# Patient Record
Sex: Male | Born: 1960
Health system: Southern US, Community
[De-identification: ages and names within clinical notes are randomized; demographics above are authoritative.]

## PROBLEM LIST (undated history)

## (undated) DIAGNOSIS — M1990 Unspecified osteoarthritis, unspecified site: Secondary | ICD-10-CM

## (undated) DIAGNOSIS — N200 Calculus of kidney: Secondary | ICD-10-CM

## (undated) DIAGNOSIS — I779 Disorder of arteries and arterioles, unspecified: Secondary | ICD-10-CM

## (undated) DIAGNOSIS — Z79899 Other long term (current) drug therapy: Secondary | ICD-10-CM

## (undated) DIAGNOSIS — K760 Fatty (change of) liver, not elsewhere classified: Secondary | ICD-10-CM

## (undated) DIAGNOSIS — R202 Paresthesia of skin: Secondary | ICD-10-CM

## (undated) DIAGNOSIS — I10 Essential (primary) hypertension: Secondary | ICD-10-CM

## (undated) DIAGNOSIS — N329 Bladder disorder, unspecified: Secondary | ICD-10-CM

## (undated) DIAGNOSIS — I5189 Other ill-defined heart diseases: Secondary | ICD-10-CM

## (undated) DIAGNOSIS — S6291XA Unspecified fracture of right wrist and hand, initial encounter for closed fracture: Secondary | ICD-10-CM

## (undated) DIAGNOSIS — I2510 Atherosclerotic heart disease of native coronary artery without angina pectoris: Secondary | ICD-10-CM

## (undated) DIAGNOSIS — I213 ST elevation (STEMI) myocardial infarction of unspecified site: Secondary | ICD-10-CM

## (undated) DIAGNOSIS — R351 Nocturia: Secondary | ICD-10-CM

## (undated) DIAGNOSIS — T8859XA Other complications of anesthesia, initial encounter: Secondary | ICD-10-CM

## (undated) DIAGNOSIS — E785 Hyperlipidemia, unspecified: Secondary | ICD-10-CM

## (undated) DIAGNOSIS — Z9861 Coronary angioplasty status: Principal | ICD-10-CM

## (undated) DIAGNOSIS — I700 Atherosclerosis of aorta: Secondary | ICD-10-CM

## (undated) DIAGNOSIS — E119 Type 2 diabetes mellitus without complications: Secondary | ICD-10-CM

## (undated) DIAGNOSIS — N400 Enlarged prostate without lower urinary tract symptoms: Secondary | ICD-10-CM

## (undated) HISTORY — DX: Nocturia: R35.1

## (undated) HISTORY — PX: KNEE SURGERY: SHX244

## (undated) HISTORY — PX: NM MYOVIEW LTD: HXRAD82

## (undated) HISTORY — PX: TRANSTHORACIC ECHOCARDIOGRAM: SHX275

## (undated) HISTORY — DX: Coronary angioplasty status: Z98.61

## (undated) HISTORY — DX: Paresthesia of skin: R20.2

## (undated) HISTORY — DX: Atherosclerotic heart disease of native coronary artery without angina pectoris: I25.10

## (undated) HISTORY — DX: Unspecified fracture of right wrist and hand, initial encounter for closed fracture: S62.91XA

## (undated) HISTORY — DX: Hyperlipidemia, unspecified: E78.5

## (undated) HISTORY — PX: COLONOSCOPY: SHX174

---
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
## 1981-12-02 DIAGNOSIS — S6291XA Unspecified fracture of right wrist and hand, initial encounter for closed fracture: Secondary | ICD-10-CM

## 1981-12-02 HISTORY — DX: Unspecified fracture of right wrist and hand, initial encounter for closed fracture: S62.91XA

## 1987-01-25 ENCOUNTER — Encounter: Payer: Self-pay | Admitting: Family Medicine

## 2002-10-21 ENCOUNTER — Encounter: Payer: Self-pay | Admitting: Family Medicine

## 2004-08-03 ENCOUNTER — Encounter: Payer: Self-pay | Admitting: Family Medicine

## 2004-08-03 LAB — CONVERTED CEMR LAB
Blood Glucose, Fasting: 110 mg/dL
TSH: 0.68 microintl units/mL
WBC, blood: 7.3 10*3/uL

## 2004-12-10 ENCOUNTER — Ambulatory Visit: Payer: Self-pay | Admitting: Family Medicine

## 2004-12-10 LAB — CONVERTED CEMR LAB: Blood Glucose, Fasting: 108 mg/dL

## 2004-12-12 ENCOUNTER — Ambulatory Visit: Payer: Self-pay | Admitting: Family Medicine

## 2005-03-14 ENCOUNTER — Ambulatory Visit: Payer: Self-pay | Admitting: Family Medicine

## 2005-03-17 DIAGNOSIS — E1149 Type 2 diabetes mellitus with other diabetic neurological complication: Secondary | ICD-10-CM | POA: Insufficient documentation

## 2005-03-19 ENCOUNTER — Ambulatory Visit: Payer: Self-pay | Admitting: Family Medicine

## 2005-11-22 ENCOUNTER — Ambulatory Visit (HOSPITAL_BASED_OUTPATIENT_CLINIC_OR_DEPARTMENT_OTHER): Admission: RE | Admit: 2005-11-22 | Discharge: 2005-11-22 | Payer: Self-pay | Admitting: Orthopedic Surgery

## 2005-11-22 ENCOUNTER — Ambulatory Visit (HOSPITAL_COMMUNITY): Admission: RE | Admit: 2005-11-22 | Discharge: 2005-11-22 | Payer: Self-pay | Admitting: Orthopedic Surgery

## 2008-06-17 ENCOUNTER — Ambulatory Visit: Payer: Self-pay | Admitting: Family Medicine

## 2008-06-17 DIAGNOSIS — L723 Sebaceous cyst: Secondary | ICD-10-CM

## 2008-06-17 DIAGNOSIS — L089 Local infection of the skin and subcutaneous tissue, unspecified: Secondary | ICD-10-CM | POA: Insufficient documentation

## 2008-06-17 DIAGNOSIS — N342 Other urethritis: Secondary | ICD-10-CM | POA: Insufficient documentation

## 2008-06-17 LAB — CONVERTED CEMR LAB
Bacteria, UA: 0
Bilirubin Urine: NEGATIVE
Blood in Urine, dipstick: NEGATIVE
Epithelial cells, urine: 0 /lpf
Glucose, Urine, Semiquant: NEGATIVE
Ketones, urine, test strip: NEGATIVE
Nitrite: NEGATIVE
Protein, U semiquant: NEGATIVE
RBC / HPF: 0
Specific Gravity, Urine: <1.005
Urobilinogen, UA: 0.2
WBC Urine, dipstick: NEGATIVE
WBC, UA: 0 cells/hpf
pH: 6

## 2008-06-24 ENCOUNTER — Encounter (HOSPITAL_BASED_OUTPATIENT_CLINIC_OR_DEPARTMENT_OTHER): Admission: RE | Admit: 2008-06-24 | Discharge: 2008-07-13 | Payer: Self-pay | Admitting: Internal Medicine

## 2008-06-28 ENCOUNTER — Encounter: Payer: Self-pay | Admitting: Family Medicine

## 2008-06-28 DIAGNOSIS — K219 Gastro-esophageal reflux disease without esophagitis: Secondary | ICD-10-CM

## 2008-06-28 DIAGNOSIS — E782 Mixed hyperlipidemia: Secondary | ICD-10-CM | POA: Insufficient documentation

## 2008-07-11 ENCOUNTER — Encounter: Payer: Self-pay | Admitting: Family Medicine

## 2008-07-20 ENCOUNTER — Encounter: Payer: Self-pay | Admitting: Family Medicine

## 2008-12-12 ENCOUNTER — Ambulatory Visit: Payer: Self-pay | Admitting: Family Medicine

## 2008-12-12 LAB — CONVERTED CEMR LAB
ALT: 47 units/L (ref 0–53)
Alkaline Phosphatase: 43 units/L (ref 39–117)
Bilirubin, Direct: 0.1 mg/dL (ref 0.0–0.3)
CO2: 25 meq/L (ref 19–32)
GFR calc Af Amer: 154 mL/min
Glucose, Bld: 114 mg/dL — ABNORMAL HIGH (ref 70–99)
Microalb Creat Ratio: 14.4 mg/g (ref 0.0–30.0)
Potassium: 4.4 meq/L (ref 3.5–5.1)
Sodium: 136 meq/L (ref 135–145)
TSH: 0.63 microintl units/mL (ref 0.35–5.50)
Total Bilirubin: 0.6 mg/dL (ref 0.3–1.2)
Total Protein: 6.9 g/dL (ref 6.0–8.3)

## 2008-12-14 ENCOUNTER — Telehealth: Payer: Self-pay | Admitting: Family Medicine

## 2008-12-14 ENCOUNTER — Ambulatory Visit: Payer: Self-pay | Admitting: Family Medicine

## 2008-12-14 DIAGNOSIS — M722 Plantar fascial fibromatosis: Secondary | ICD-10-CM | POA: Insufficient documentation

## 2009-04-17 ENCOUNTER — Ambulatory Visit: Payer: Self-pay | Admitting: Family Medicine

## 2009-06-09 ENCOUNTER — Ambulatory Visit: Payer: Self-pay | Admitting: Family Medicine

## 2009-12-20 ENCOUNTER — Ambulatory Visit: Payer: Self-pay | Admitting: Family Medicine

## 2009-12-20 LAB — CONVERTED CEMR LAB
Alkaline Phosphatase: 47 units/L (ref 39–117)
BUN: 15 mg/dL (ref 6–23)
Basophils Absolute: 0 10*3/uL (ref 0.0–0.1)
Basophils Relative: 0.5 % (ref 0.0–3.0)
Bilirubin, Direct: 0 mg/dL (ref 0.0–0.3)
CO2: 28 meq/L (ref 19–32)
Calcium: 9.5 mg/dL (ref 8.4–10.5)
Cholesterol: 251 mg/dL — ABNORMAL HIGH (ref 0–200)
Creatinine, Ser: 0.8 mg/dL (ref 0.4–1.5)
Creatinine,U: 124.3 mg/dL
Direct LDL: 141.1 mg/dL
Eosinophils Absolute: 0.2 10*3/uL (ref 0.0–0.7)
Lymphocytes Relative: 33.6 % (ref 12.0–46.0)
MCHC: 32.8 g/dL (ref 30.0–36.0)
Microalb Creat Ratio: 14.5 mg/g (ref 0.0–30.0)
Microalb, Ur: 1.8 mg/dL (ref 0.0–1.9)
Monocytes Absolute: 0.6 10*3/uL (ref 0.1–1.0)
Neutrophils Relative %: 55.2 % (ref 43.0–77.0)
Platelets: 226 10*3/uL (ref 150.0–400.0)
RBC: 4.78 M/uL (ref 4.22–5.81)
RDW: 12.4 % (ref 11.5–14.6)
Total Bilirubin: 1 mg/dL (ref 0.3–1.2)
Total CHOL/HDL Ratio: 6
Triglycerides: 476 mg/dL — ABNORMAL HIGH (ref 0.0–149.0)

## 2009-12-27 ENCOUNTER — Encounter (INDEPENDENT_AMBULATORY_CARE_PROVIDER_SITE_OTHER): Payer: Self-pay | Admitting: *Deleted

## 2009-12-27 ENCOUNTER — Ambulatory Visit: Payer: Self-pay | Admitting: Family Medicine

## 2010-01-29 ENCOUNTER — Encounter (INDEPENDENT_AMBULATORY_CARE_PROVIDER_SITE_OTHER): Payer: Self-pay | Admitting: *Deleted

## 2010-01-30 ENCOUNTER — Ambulatory Visit: Payer: Self-pay | Admitting: Gastroenterology

## 2010-02-23 ENCOUNTER — Ambulatory Visit: Payer: Self-pay | Admitting: Gastroenterology

## 2010-02-23 LAB — HM COLONOSCOPY

## 2010-02-27 ENCOUNTER — Encounter: Payer: Self-pay | Admitting: Gastroenterology

## 2010-03-09 ENCOUNTER — Telehealth: Payer: Self-pay | Admitting: Family Medicine

## 2010-07-04 ENCOUNTER — Encounter (INDEPENDENT_AMBULATORY_CARE_PROVIDER_SITE_OTHER): Payer: Self-pay | Admitting: *Deleted

## 2010-11-20 ENCOUNTER — Encounter: Payer: Self-pay | Admitting: Family Medicine

## 2010-12-11 ENCOUNTER — Ambulatory Visit
Admission: RE | Admit: 2010-12-11 | Discharge: 2010-12-11 | Payer: Self-pay | Source: Home / Self Care | Attending: Family Medicine | Admitting: Family Medicine

## 2010-12-11 ENCOUNTER — Other Ambulatory Visit: Payer: Self-pay | Admitting: Family Medicine

## 2010-12-11 LAB — LIPID PANEL
Cholesterol: 278 mg/dL — ABNORMAL HIGH (ref 0–200)
HDL: 37.9 mg/dL — ABNORMAL LOW (ref >39.00)
Total CHOL/HDL Ratio: 7
Triglycerides: 863 mg/dL — ABNORMAL HIGH (ref 0.0–149.0)
VLDL: 172.6 mg/dL — ABNORMAL HIGH (ref 0.0–40.0)

## 2010-12-11 LAB — CBC WITH DIFFERENTIAL/PLATELET
Basophils Absolute: 0 10*3/uL (ref 0.0–0.1)
Basophils Relative: 0.3 % (ref 0.0–3.0)
Eosinophils Absolute: 0.2 10*3/uL (ref 0.0–0.7)
Eosinophils Relative: 3.2 % (ref 0.0–5.0)
HCT: 39.8 % (ref 39.0–52.0)
Hemoglobin: 14 g/dL (ref 13.0–17.0)
Lymphocytes Relative: 41.2 % (ref 12.0–46.0)
Lymphs Abs: 2.9 10*3/uL (ref 0.7–4.0)
MCHC: 35.2 g/dL (ref 30.0–36.0)
MCV: 86.9 fl (ref 78.0–100.0)
Monocytes Absolute: 0.6 10*3/uL (ref 0.1–1.0)
Monocytes Relative: 8 % (ref 3.0–12.0)
Neutro Abs: 3.3 10*3/uL (ref 1.4–7.7)
Neutrophils Relative %: 47.3 % (ref 43.0–77.0)
Platelets: 217 10*3/uL (ref 150.0–400.0)
RBC: 4.58 Mil/uL (ref 4.22–5.81)
RDW: 13 % (ref 11.5–14.6)
WBC: 7.1 10*3/uL (ref 4.5–10.5)

## 2010-12-11 LAB — HEPATIC FUNCTION PANEL
ALT: 27 U/L (ref 0–53)
AST: 24 U/L (ref 0–37)
Albumin: 4.1 g/dL (ref 3.5–5.2)
Alkaline Phosphatase: 49 U/L (ref 39–117)
Bilirubin, Direct: 0.1 mg/dL (ref 0.0–0.3)
Total Bilirubin: 0.6 mg/dL (ref 0.3–1.2)
Total Protein: 6.8 g/dL (ref 6.0–8.3)

## 2010-12-11 LAB — BASIC METABOLIC PANEL
BUN: 17 mg/dL (ref 6–23)
CO2: 28 mEq/L (ref 19–32)
Calcium: 9 mg/dL (ref 8.4–10.5)
Chloride: 104 mEq/L (ref 96–112)
Creatinine, Ser: 0.7 mg/dL (ref 0.4–1.5)
GFR: 128.44 mL/min (ref >60.00)
Glucose, Bld: 126 mg/dL — ABNORMAL HIGH (ref 70–99)
Potassium: 4.8 mEq/L (ref 3.5–5.1)
Sodium: 139 mEq/L (ref 135–145)

## 2010-12-11 LAB — TSH: TSH: 0.82 u[IU]/mL (ref 0.35–5.50)

## 2010-12-11 LAB — MICROALBUMIN / CREATININE URINE RATIO
Creatinine,U: 145 mg/dL
Microalb Creat Ratio: 1.1 mg/g (ref 0.0–30.0)
Microalb, Ur: 1.6 mg/dL (ref 0.0–1.9)

## 2010-12-11 LAB — HEMOGLOBIN A1C: Hgb A1c MFr Bld: 7.2 % — ABNORMAL HIGH (ref 4.6–6.5)

## 2010-12-11 LAB — LDL CHOLESTEROL, DIRECT: Direct LDL: 112.5 mg/dL

## 2010-12-11 LAB — PSA: PSA: 0.52 ng/mL (ref 0.10–4.00)

## 2010-12-13 ENCOUNTER — Ambulatory Visit
Admission: RE | Admit: 2010-12-13 | Discharge: 2010-12-13 | Payer: Self-pay | Source: Home / Self Care | Attending: Family Medicine | Admitting: Family Medicine

## 2010-12-13 ENCOUNTER — Telehealth: Payer: Self-pay | Admitting: Family Medicine

## 2010-12-13 LAB — HM DIABETES FOOT EXAM

## 2010-12-19 ENCOUNTER — Encounter: Payer: Self-pay | Admitting: Family Medicine

## 2010-12-26 ENCOUNTER — Encounter: Payer: Self-pay | Admitting: Family Medicine

## 2010-12-26 LAB — HM DIABETES EYE EXAM: HM Diabetic Eye Exam: NORMAL

## 2011-01-01 ENCOUNTER — Telehealth: Payer: Self-pay | Admitting: Family Medicine

## 2011-01-02 NOTE — Letter (Signed)
Summary: Previsit letter  [facility]  [address] [place], [place] [address]   Phone: [phone]  Fax: [phone]       12/27/2009 MRN: [id number]  [name] [name] [address] [place], [place]  [address]  Dear [name],  Welcome to the Gastroenterology Division at [facility].    You are scheduled to see a nurse for your pre-procedure visit on 01/30/2010 at 4:30pm on the 3rd floor at [facility], [address]  We ask that you try to arrive at our office 15 minutes prior to your appointment time to allow for check-in.  Your nurse visit will consist of discussing your medical and surgical history, your immediate family medical history, and your medications.    Please bring a complete list of all your medications or, if you prefer, bring the medication bottles and we will list them.  We will need to be aware of both prescribed and over the counter drugs.  We will need to know exact dosage information as well.  If you are on blood thinners (Coumadin, Plavix, Aggrenox, Ticlid, etc.) please call our office today/prior to your appointment, as we need to consult with your physician about holding your medication.   Please be prepared to read and sign documents such as consent forms, a financial agreement, and acknowledgement forms.  If necessary, and with your consent, a friend or relative is welcome to sit-in on the nurse visit with you.  Please bring your insurance card so that we may make a copy of it.  If your insurance requires a referral to see a specialist, please bring your referral form from your primary care physician.  No co-pay is required for this nurse visit.     If you cannot keep your appointment, please call [phone] to cancel or reschedule prior to your appointment date.  This allows [place] the opportunity to schedule an appointment for another patient in need of care.    Thank you for choosing [facility] for your medical needs.  We  appreciate the opportunity to care for you.  Please visit [place] at our website  to learn more about our practice.                     Sincerely.                                                                                                                   The Gastroenterology Division

## 2011-01-02 NOTE — Progress Notes (Signed)
Summary: Nose Bleed  Phone Note Call from Patient Call back at [phone]   Caller: Patient Call For: Shaune Leeks MD Summary of Call: Patient has been having a nose bleed about once daily since last Saturday.  He is not having any other symptoms just the nose bleed but wants to know if he should be evaluated soon.  No cold, no sinus problems.  He says it took him a while to get it stopped once it started bleeding, the last time it took him about an hour to get it stopped.  Please advise.  Uses [facility]. Initial call taken by: Linde Gillis CMA [facility]),  March 09, 2010 9:04 AM  Follow-up for Phone Call        does not appear to be on coumadin.  These are usually benign and self resolving, but sometimes we need to burn the vessel in the nose that has ruptured.  An hour is quite a long time.  If he is feeling light headed at all, I would have him to go our weekend clinic tomorrow to be evaluated. Follow-up by: Ruthe Mannan MD,  March 09, 2010 9:41 AM  Additional Follow-up for Phone Call Additional follow up Details #1::        Patient advised as instructed.  He says that he does not have any light headed feelings now or even in the past when his nose bleeds.  Will just see how he does over the weekend and go from there.  Advised that if his nose started to bleed more, uncontrollably, or his symptoms change or worsen go to ER over the weekend.  Advised him to not blow his nose if he could help it but only wipe it, blowing hard can trigger the nose bleeds as well. Additional Follow-up by: Linde Gillis CMA [facility]),  March 09, 2010 10:18 AM

## 2011-01-02 NOTE — Letter (Signed)
Summary: Nadara Eaton letter  [place] [facility]  [address] [place], [place] [address]   Phone: [phone]  Fax: [phone]       07/04/2010 MRN: [id number]  SABIR CHARTERS [address] [place], [place]  [address]  Dear Mr. [facility], and  announce the retirement of Arta Silence, M.D., from full-time practice at the [facility] office effective May 31, 2010 and his plans of returning part-time.  It is important to Dr. Hetty Ely and to our practice that you understand that [facility] has seven physicians in our office for your health care needs.  We will continue to offer the same exceptional care that you have today.    Dr. Hetty Ely has spoken to many of you about his plans for retirement and returning part-time in the fall.   We will continue to work with you through the transition to schedule appointments for you in the office and meet the high standards that [place] is committed to.   Again, it is with great pleasure that we share the news that Dr. Hetty Ely will return to [facility] in October of 2011 with a reduced schedule.    If you have any questions, or would like to request an appointment with one of our physicians, please call us at [phone] and press the option for Scheduling an appointment.  We take pleasure in providing you with excellent patient care and look forward to seeing you at your next office visit.  Our [facility] Physicians are:  Tillman Abide, M.D. Laurita Quint, M.D. Roxy Manns, M.D. Kerby Nora, M.D. Hannah Beat, M.D. Ruthe Mannan, M.D. We proudly welcomed Raechel Ache, M.D. and Eustaquio Boyden, M.D. to the practice in July/August 2011.  Sincerely,  [facility]

## 2011-01-02 NOTE — Letter (Signed)
Summary: Patient Notice-Hyperplastic Polyps  [facility]  [address] [place], [place] [address]   Phone: [phone]  Fax: [phone]        February 27, 2010 MRN: [id number]    BRAILON DON [address] [place], [place]  [address]    Dear Mr. Langlois,  I am pleased to inform you that the colon polyp(s) removed during your recent colonoscopy was (were) found to be hyperplastic.  These types of polyps are NOT pre-cancerous.  It is therefore my recommendation that you have a repeat colonoscopy examination in 10_ years for routine colorectal cancer screening.  Should you develop new or worsening symptoms of abdominal pain, bowel habit changes or bleeding from the rectum or bowels, please schedule an evaluation with either your primary care physician or with me.  Additional information/recommendations:  __No further action with gastroenterology is needed at this time.      Please follow-up with your primary care physician for your other      healthcare needs. __Please call [phone] to schedule a return visit to review      your situation.  __Please keep your follow-up visit as already scheduled.  _x_Continue treatment plan as outlined the day of your exam.  Please call us if you are having persistent problems or have questions about your condition that have not been fully answered at this time.  Sincerely,  Louis Meckel MD This letter has been electronically signed by your physician.  Appended Document: Patient Notice-Hyperplastic Polyps letter mailed 3.30.11

## 2011-01-02 NOTE — Letter (Signed)
Summary: [facility] Instructions  [facility]  [address] [place], [place] [address]   Phone: [phone]  Fax: [phone]       [name] [name]    1961/12/01    MRN: [id number]        Procedure Day /Date:  Friday 02/23/2010     Arrival Time: 10:30 am      Procedure Time: 11:30 am     Location of Procedure:                    _x _  [facility] (4th Floor)   PREPARATION FOR COLONOSCOPY WITH MOVIPREP   Starting 5 days prior to your procedure Sunday 3/20 do not eat nuts, seeds, popcorn, corn, beans, peas,  salads, or any raw vegetables.  Do not take any fiber supplements (e.g. Metamucil, Citrucel, and Benefiber).  THE DAY BEFORE YOUR PROCEDURE         DATE: Thursday 3/24  1.  Drink clear liquids the entire day-NO SOLID FOOD  2.  Do not drink anything colored red or purple.  Avoid juices with pulp.  No orange juice.  3.  Drink at least 64 oz. (8 glasses) of fluid/clear liquids during the day to prevent dehydration and help the prep work efficiently.  CLEAR LIQUIDS INCLUDE: Water Jello Ice Popsicles Tea (sugar ok, no milk/cream) Powdered fruit flavored drinks Coffee (sugar ok, no milk/cream) Gatorade Juice: apple, white grape, white cranberry  Lemonade Clear bullion, consomm, broth Carbonated beverages (any kind) Strained chicken noodle soup Hard Candy                             4.  In the morning, mix first dose of MoviPrep solution:    Empty 1 Pouch A and 1 Pouch B into the disposable container    Add lukewarm drinking water to the top line of the container. Mix to dissolve    Refrigerate (mixed solution should be used within 24 hrs)  5.  Begin drinking the prep at 5:00 p.m. The MoviPrep container is divided by 4 marks.   Every 15 minutes drink the solution down to the next mark (approximately 8 oz) until the full liter is complete.   6.  Follow completed prep with 16 oz of clear liquid of your choice (Nothing red or purple).   Continue to drink clear liquids until bedtime.  7.  Before going to bed, mix second dose of MoviPrep solution:    Empty 1 Pouch A and 1 Pouch B into the disposable container    Add lukewarm drinking water to the top line of the container. Mix to dissolve    Refrigerate  THE DAY OF YOUR PROCEDURE      DATE: Friday 3/25  Beginning at 6:30 a.m. (5 hours before procedure):         1. Every 15 minutes, drink the solution down to the next mark (approx 8 oz) until the full liter is complete.  2. Follow completed prep with 16 oz. of clear liquid of your choice.    3. You may drink clear liquids until 9:30 am  (2 HOURS BEFORE PROCEDURE).   MEDICATION INSTRUCTIONS  Unless otherwise instructed, you should take regular prescription medications with a small sip of water   as early as possible the morning of your procedure.         OTHER INSTRUCTIONS  You will need a responsible adult at  least 52 years of age to accompany you and drive you home.   This person must remain in the waiting room during your procedure.  Wear loose fitting clothing that is easily removed.  Leave jewelry and other valuables at home.  However, you may wish to bring a book to read or  an iPod/MP3 player to listen to music as you wait for your procedure to start.  Remove all body piercing jewelry and leave at home.  Total time from sign-in until discharge is approximately 2-3 hours.  You should go home directly after your procedure and rest.  You can resume normal activities the  day after your procedure.  The day of your procedure you should not:   Drive   Make legal decisions   Operate machinery   Drink alcohol   Return to work  You will receive specific instructions about eating, activities and medications before you leave.    The above instructions have been reviewed and explained to me by   Ezra Sites RN  January 30, 2010 4:30 PM    I fully understand and can verbalize these  instructions _____________________________ Date _________

## 2011-01-02 NOTE — Assessment & Plan Note (Signed)
Summary: cpx/bir   Vital Signs:  Patient profile:   50 year old male Weight:      214 pounds Temp:     98.6 degrees F oral Pulse rate:   84 / minute Pulse rhythm:   regular BP sitting:   120 / 74  (left arm) Cuff size:   large  Vitals Entered By: Sydell Axon LPN (December 27, 2009 2:58 PM) CC: 30 Minute checkup, wants to schedule a colonoscopy   History of Present Illness: Pt here for Comp Exam, has no complaints and feels well. He has just turned 48 and desires colonoscopy. His foot still bothers him some.  Preventive Screening-Counseling & Management  Alcohol-Tobacco     Alcohol drinks/day: <1     Alcohol type: beer     Smoking Status: quit < 6 months     Smoking Cessation Counseling: yes     Year Started: 1985     Year Quit: 2011     Cans of tobacco/week: no     Passive Smoke Exposure: no  Caffeine-Diet-Exercise     Caffeine use/day: 1     Does Patient Exercise: yes     Type of exercise: goes to [employer]     Times/week: 1  Problems Prior to Update: 1)  Sinusitis - Acute-nos  (ICD-461.9) 2)  Allergic Rhinitis  (ICD-477.9) 3)  Dizziness  (ICD-780.4) 4)  Plantar Fasciitis, Left  (ICD-728.71) 5)  Health Maintenance Exam  (ICD-V70.0) 6)  Hyperglycemia  (ICD-790.29) 7)  Gerd  (ICD-530.81) 8)  Hyperlipidemia, Mixed  (ICD-272.2) 9)  Other Urethritis  (ICD-597.89) 10)  Sebaceous Cyst  (ICD-706.2)  Medications Prior to Update: 1)  Allegra 180 Mg Tabs (Fexofenadine Hcl) .Marland Kitchen.. 1 Daily By Mouth As Needed 2)  Bayer Aspirin Ec Low Dose 81 Mg Tbec (Aspirin) .Marland Kitchen.. 1 Daily By Mouth 3)  Amoxicillin 875 Mg Tabs (Amoxicillin) .Marland Kitchen.. 1 By Mouth Two Times A Day  Allergies: No Known Drug Allergies  Past History:  Past Medical History: Last updated: 06/09/2009 Allergic rhinitis  Family History: Last updated: 12/27/2009 Father:A 74 HTN CHOL DM  lost 60 pounds and off all meds Mother: A 25  OVARIAN CANCER BROTHER A 44 Hearing Deficit SISTER A 51 CV: + PGF DECEASED MI HBP:  NEGATIVE JX:[id number] GOUT/ARTHRITIS:  PROSTATE CANCER: NEGATIVE OVARIAN CANCER: POSITIVE MOTHER BREAST/UTERINE CANCER: NEGATIVE COLON CANCER: + FATHER POLYPS DEPRESSION: NEGATIVE ETOH/DRUG ABUSE: NEGATIVE OTHER: NEGATIVE STROKE  Social History: Last updated: 12/14/2008 Marital Status: Married LIVES WITH WIFE Children: 1 SON  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[employer] ; [employer]LECT[employer]  Risk Factors: Alcohol Use: <1 (12/27/2009) Caffeine Use: 1 (12/27/2009) Exercise: yes (12/27/2009)  Risk Factors: Smoking Status: quit < 6 months (12/27/2009) Cans of tobacco/wk: no (12/27/2009) Passive Smoke Exposure: no (12/27/2009)  Past Surgical History: SEPTOPLASTY DUE TO FX NOSE  1970 FX R HAND  1983  Family History: Father:A 74 HTN CHOL DM  lost 60 pounds and off all meds Mother: A 31  OVARIAN CANCER BROTHER A 44 Hearing Deficit SISTER A 51 CV: + PGF DECEASED MI HBP: NEGATIVE AO:ZHYQMVHQ GOUT/ARTHRITIS:  PROSTATE CANCER: NEGATIVE OVARIAN CANCER: POSITIVE MOTHER BREAST/UTERINE CANCER: NEGATIVE COLON CANCER: + FATHER POLYPS DEPRESSION: NEGATIVE ETOH/DRUG ABUSE: NEGATIVE OTHER: NEGATIVE STROKE  Social History: Smoking Status:  quit < 6 months Does Patient Exercise:  yes  Review of Systems General:  Denies chills, fatigue, fever, sweats, weakness, and weight loss. Eyes:  Denies blurring, discharge, eye irritation, and eye pain. ENT:  Complains of decreased hearing; denies earache and ringing  in ears; wears hewaring aids bilat. CV:  Denies chest pain or discomfort, fainting, fatigue, palpitations, and shortness of breath with exertion. Resp:  Denies cough, shortness of breath, sputum productive, and wheezing. GI:  Complains of indigestion; denies abdominal pain, bloody stools, change in bowel habits, constipation, dark tarry stools, diarrhea, loss of appetite, nausea, vomiting, vomiting blood, and yellowish skin color; 0ccas. GU:  Denies discharge, dysuria, nocturia, and urinary  frequency. MS:  Denies joint pain, joint redness, joint swelling, low back pain, muscle aches, cramps, muscle weakness, and stiffness. Derm:  Denies dryness, itching, and rash. Neuro:  Complains of poor balance; denies numbness, tingling, and tremors.  Physical Exam  General:  Well-developed,well-nourished,in no acute distress; alert,appropriate and cooperative throughout examination Head:  Normocephalic and atraumatic without obvious abnormalities. No apparent alopecia or balding. Eyes:  Conjunctiva clear bilaterally.  Ears:  External ear exam shows no significant lesions or deformities.  Otoscopic examination reveals clear canals, tympanic membranes are intact bilaterally without bulging, retraction, inflammation or discharge. Hearing is mildly decreased bilaterally. Hearing aids in place bilat.Hearing aids in place bilat. Nose:  External nasal examination shows no deformity or inflammation. Nasal mucosa are pink and moist without lesions or exudates. Mouth:  Oral mucosa and oropharynx without lesions or exudates.  Teeth in good repair. Neck:  No deformities, masses, or tenderness noted. Carotids quiet. Chest Wall:  No deformities, masses, tenderness or gynecomastia noted. Breasts:  No masses or gynecomastia noted Lungs:  Normal respiratory effort, chest expands symmetrically. Lungs are clear to auscultation, no crackles or wheezes. Heart:  Normal rate and regular rhythm. S1 and S2 normal without gallop, murmur, click, rub or other extra sounds. Abdomen:  Bowel sounds positive,abdomen soft and non-tender without masses, organomegaly or hernias noted. Mildly protuberanty. Rectal:  No external abnormalities noted. Normal sphincter tone. No rectal masses or tenderness. G neg. Mild ext hemms deflated. Genitalia:  Testes bilaterally descended without nodularity, tenderness or masses. No scrotal masses or lesions. No penis lesions or urethral discharge. Prostate:  Prostate gland firm and smooth, no  enlargement, nodularity, tenderness, mass, asymmetry or induration. 10 gms. Msk:  No deformity or scoliosis noted of thoracic or lumbar spine.   Pulses:  R and L carotid,radial,femoral,dorsalis pedis and posterior tibial pulses are full and equal bilaterally Extremities:  No clubbing, cyanosis, edema, or deformity noted with normal full range of motion of all joints.   Neurologic:  No cranial nerve deficits noted. Station and gait are normal. Plantar reflexes are down-going bilaterally. DTRs are symmetrical throughout. Sensory, motor and coordinative functions appear intact. Skin:  Intact without suspicious lesions or rashes Cervical Nodes:  No lymphadenopathy noted Inguinal Nodes:  No significant adenopathy Psych:  Cognition and judgment appear intact. Alert and cooperative with normal attention span and concentration. No apparent delusions, illusions, hallucinations   Impression & Recommendations:  Problem # 1:  HEALTH MAINTENANCE EXAM (ICD-V70.0) Assessment Comment Only  Orders: Gastroenterology Referral (GI)  Problem # 2:  SPECIAL SCREENING MALIGNANT NEOPLASM OF PROSTATE (ICD-V76.44) Assessment: New Will start doing next year.  Problem # 3:  HYPERGLYCEMIA (ICD-790.29) Assessment: Unchanged Still high ...discussed .Pt's father just lost lots of weight and normalized his DM and Ht and Chol. Labs Reviewed: Creat: 0.8 (12/20/2009)     Problem # 4:  GERD (ICD-530.81) Assessment: Unchanged Will improve with wt loss.  Problem # 5:  HYPERLIPIDEMIA, MIXED (ICD-272.2) Assessment: Unchanged Trigs too high, LDL too high HDL slightly low. Again wt loss Labs Reviewed: SGOT: 23 (12/20/2009)   SGPT:  30 (12/20/2009)   HDL:39.40 (12/20/2009), 28.3 (12/12/2008)  LDL:DEL (12/12/2008)  Chol:251 (12/20/2009), 223 (12/12/2008)  Trig:476.0 (12/20/2009), 433 (12/12/2008)  Complete Medication List: 1)  Allegra 180 Mg Tabs (Fexofenadine hcl) .Marland Kitchen.. 1 daily by mouth as needed 2)  Bayer Aspirin Ec Low  Dose 81 Mg Tbec (Aspirin) .Marland Kitchen.. 1 daily by mouth  Patient Instructions: 1)  Refer for colonoscopy.  Current Allergies (reviewed today): No known allergies

## 2011-01-02 NOTE — Miscellaneous (Signed)
Summary: [facility]  Clinical Lists Changes  Medications: Added new medication of MOVIPREP 100 GM  SOLR (PEG-KCL-NACL-NASULF-NA ASC-C) As per prep instructions. - Signed Rx of MOVIPREP 100 GM  SOLR (PEG-KCL-NACL-NASULF-NA ASC-C) As per prep instructions.;  #1 x 0;  Signed;  Entered by: Ezra Sites RN;  Authorized by: Louis Meckel MD;  Method used: Electronically to [facility] [address], [place], [place]  [address], Ph: [phone], Fax: [phone] Observations: Added new observation of NKA: T (01/30/2010 15:59)    Prescriptions: MOVIPREP 100 GM  SOLR (PEG-KCL-NACL-NASULF-NA ASC-C) As per prep instructions.  #1 x 0   Entered by:   Ezra Sites RN   Authorized by:   Louis Meckel MD   Signed by:   Ezra Sites RN on 01/30/2010   Method used:   Electronically to        [facility]* (retail)       [address]       [place], [place]  [address]       Ph: [phone]       Fax: [phone]   RxID:   [id number]

## 2011-01-02 NOTE — Procedures (Signed)
Summary: Colonoscopy  Patient: [name] [name] Note: All result statuses are Final unless otherwise noted.  Tests: (1) Colonoscopy (COL)   COL Colonoscopy           DONE     [facility]     [address]     [place], [place]  [address]           COLONOSCOPY PROCEDURE REPORT           PATIENT:  [name] [name]  MR#:  [id number]     BIRTHDATE:  1961-03-29, 48 yrs. old  GENDER:  male     ENDOSCOPIST:  Barbette Hair. Arlyce Dice, MD     REF. BY:  Laurita Quint, M.D.     PROCEDURE DATE:  02/23/2010     PROCEDURE:  Colonoscopy with snare polypectomy     ASA CLASS:  Class I     INDICATIONS:  Routine Risk Screening     MEDICATIONS:   Fentanyl 75 mcg IV, Versed 7 mg IV           DESCRIPTION OF PROCEDURE:   After the risks benefits and     alternatives of the procedure were thoroughly explained, informed     consent was obtained.  Digital rectal exam was performed and     revealed no abnormalities.   The LB [id number] endoscope     was introduced through the anus and advanced to the cecum, which     was identified by the ileocecal valve, without limitations.  The     quality of the prep was good, using MoviPrep.  The instrument was     then slowly withdrawn as the colon was fully examined.     <<PROCEDUREIMAGES>>           FINDINGS:  There were multiple polyps identified and removed. in     the rectum and sigmoid colon. Multiple 1-12mm sessile, hyperplastic     appearing polyps from rectum to 25cm from anus. The 3 largest     polyps were removed with cold polypectomy snare and submitted to     pathology (see image20, image21, and image17).  This was otherwise     a normal examination of the colon (see image1, image2, image3,     image4, image5, image7, image8, image10, image11, and image22).     Retroflexed views in the rectum revealed no abnormalities.    The     scope was then withdrawn from the patient and the procedure     completed.           COMPLICATIONS:  None  ENDOSCOPIC IMPRESSION:     1) Polyps, multiple in the rectum and sigmoid colon     2) Otherwise normal examination     RECOMMENDATIONS:     1) Await biopsy results     REPEAT EXAM:   You will receive a letter from Dr. Arlyce Dice in 1-2     weeks, after reviewing the final pathology, with followup     recommendations.           ______________________________     Barbette Hair Arlyce Dice, MD           CC:           n.     eSIGNED:   Barbette Hair. Kaplan at 02/23/2010 12:09 PM           Ebbie Ridge, [id number]  Note: An exclamation mark (!) indicates a result that was not dispersed  into the flowsheet. Document Creation Date: 02/23/2010 12:51 PM _______________________________________________________________________  (1) Order result status: Final Collection or observation date-time: 02/23/2010 12:02 Requested date-time:  Receipt date-time:  Reported date-time:  Referring Physician:   Ordering Physician: Melvia Heaps [phone]) Specimen Source:  Source: Launa Grill Order Number: [id number] Lab site:   Appended Document: Colonoscopy     Procedures Next Due Date:    Colonoscopy: 01/2020  Appended Document: Colonoscopy    Clinical Lists Changes  Observations: Added new observation of PAST SURG HX: SEPTOPLASTY DUE TO FX NOSE  1970 FX R HAND  1983 COLONOSCOPY POLYPS BENIGN (DR KAPLAN) 02/23/2010               REPEAT  2021 (02/27/2010 17:21)       Past Surgical History:    SEPTOPLASTY DUE TO FX NOSE  1970    FX R HAND  1983    COLONOSCOPY POLYPS BENIGN (DR KAPLAN) 02/23/2010               REPEAT  2021

## 2011-01-03 NOTE — Consult Note (Signed)
Summary: Dr.Todd Hyatt,[facility],Note  Dr.Todd Hyatt,[facility],Note   Imported By: Beau Fanny 12/04/2010 16:47:41  _____________________________________________________________________  External Attachment:    Type:   Image     Comment:   External Document  Appended Document: Dr.Todd Hyatt,[facility],Note Pls insure pt gets scheduled for PE. Dr Al Corpus suggests some findings c/w diabetes. Needs fasating BW.  Appended Document: Dr.Todd Hyatt,[facility],Note Left message on voicemail to call back.   Appended Document: Dr.Todd Hyatt,[facility],Note Left message at home number and on cell phone to call back.  Appended Document: Dr.Todd Hyatt,[facility],Note Spoke with pt, appts made for labs and physical.

## 2011-01-03 NOTE — Progress Notes (Signed)
Summary: needs written script for glucometer and supplies  Phone Note Call from Patient Call back at Home Phone [phone]   Caller: Spouse Charisse Klinefelter Summary of Call: Pt needs written script for glucometer, test strips and lancets.  Script needs to read glucometer of his choice.  I will fax written script to [facility] tomorrow. Initial call taken by: Lowella Petties CMA, AAMA,  December 13, 2010 4:50 PM  Follow-up for Phone Call        Done. Follow-up by: Shaune Leeks MD,  December 13, 2010 5:17 PM  Additional Follow-up for Phone Call Additional follow up Details #1::        Script faxed.                Lowella Petties CMA, AAMA  December 14, 2010 10:10 AM

## 2011-01-03 NOTE — Assessment & Plan Note (Signed)
Summary: CPX   Vital Signs:  Patient profile:   50 year old male Height:      70.5 inches Weight:      210.75 pounds BMI:     29.92 Temp:     98.1 degrees F oral Pulse rate:   76 / minute Pulse rhythm:   regular BP sitting:   120 / 80  (left arm) Cuff size:   large  Vitals Entered By: Sydell Axon LPN (December 13, 2010 8:27 AM) CC: 30 Minute checkup, had a colonoscopy 03/11 by Dr. Arlyce Dice   History of Present Illness: Pt here for Comp Exam. He had polyp on colonoscopy, to be redone in 10 years. He had foot exam by Dr Al Corpus for tenderness of the plantar surface.  He has numbness and rawness of the toes He has no complaints today..he has been concerned about sugar...Marland Kitchenhe has been craving and eating a lot of sweets and ice cream lately.  Preventive Screening-Counseling & Management  Alcohol-Tobacco     Alcohol drinks/day: <1     Alcohol type: beer     Smoking Status: recurrent     Smoking Cessation Counseling: yes     Year Started: 52     Year Quit: 2011     Cans of tobacco/week: no     Passive Smoke Exposure: no  Caffeine-Diet-Exercise     Caffeine use/day: 1     Does Patient Exercise: yes     Type of exercise: goes to [employer]     Times/week: 1  Problems Prior to Update: 1)  Special Screening Malignant Neoplasm of Prostate  (ICD-V76.44) 2)  Plantar Fasciitis, Left  (ICD-728.71) 3)  Health Maintenance Exam  (ICD-V70.0) 4)  Hyperglycemia  (ICD-790.29) 5)  Gerd  (ICD-530.81) 6)  Hyperlipidemia, Mixed  (ICD-272.2) 7)  Other Urethritis  (ICD-597.89) 8)  Sebaceous Cyst  (ICD-706.2)  Medications Prior to Update: 1)  Allegra 180 Mg Tabs (Fexofenadine Hcl) .Marland Kitchen.. 1 Daily By Mouth As Needed 2)  Bayer Aspirin Ec Low Dose 81 Mg Tbec (Aspirin) .Marland Kitchen.. 1 Daily By Mouth  Current Medications (verified): 1)  Allegra 180 Mg Tabs (Fexofenadine Hcl) .Marland Kitchen.. 1 Daily By Mouth As Needed 2)  Bayer Aspirin Ec Low Dose 81 Mg Tbec (Aspirin) .Marland Kitchen.. 1 Daily By Mouth 3)  Centrum Silver  Tabs  (Multiple Vitamins-Minerals) .... Take One By Mouth Daily 4)  Ranitidine Hcl 150 Mg Tabs (Ranitidine Hcl) .... Take One By Mouth Daily As Needed 5)  Omega-3 Krill Oil 300 Mg Caps (Krill Oil) .... Take One By Mouth Daily  Allergies: No Known Drug Allergies  Past History:  Past Medical History: Last updated: 06/09/2009 Allergic rhinitis  Past Surgical History: Last updated: 02/27/2010 SEPTOPLASTY DUE TO FX NOSE  1970 FX R HAND  1983 COLONOSCOPY POLYPS BENIGN (DR KAPLAN) 02/23/2010               REPEAT  2021  Family History: Last updated: 12/13/2010 Father:A 75 HTN CHOL DM  lost 60 pounds and off all meds Bladder Ca  Kidney cancer Mother: A 87  OVARIAN CANCER BROTHER A 45 Hearing Deficit SISTER A 52 CV: + PGF DECEASED MI HBP: NEGATIVE [id number] GOUT/ARTHRITIS:  PROSTATE CANCER: NEGATIVE OVARIAN CANCER: POSITIVE MOTHER BREAST/UTERINE CANCER: NEGATIVE COLON CANCER: + FATHER POLYPS DEPRESSION: NEGATIVE ETOH/DRUG ABUSE: NEGATIVE OTHER: NEGATIVE STROKE  Social History: Last updated: 12/13/2010 Marital Status: Married LIVES WITH WIFE Children: 1 SON  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MGR ; [id number]  Risk Factors: Alcohol Use: <1 (12/13/2010)  Caffeine Use: 1 (12/13/2010) Exercise: yes (12/13/2010)  Risk Factors: Smoking Status: recurrent (12/13/2010) Cans of tobacco/wk: no (12/13/2010) Passive Smoke Exposure: no (12/13/2010)  Family History: Father:A 75 HTN CHOL DM  lost 60 pounds and off all meds Bladder Ca  Kidney cancer Mother: A 74  OVARIAN CANCER BROTHER A 45 Hearing Deficit SISTER A 52 CV: + PGF DECEASED MI HBP: NEGATIVE [id number] GOUT/ARTHRITIS:  PROSTATE CANCER: NEGATIVE OVARIAN CANCER: POSITIVE MOTHER BREAST/UTERINE CANCER: NEGATIVE COLON CANCER: + FATHER POLYPS DEPRESSION: NEGATIVE ETOH/DRUG ABUSE: NEGATIVE OTHER: NEGATIVE STROKE  Social History: Marital Status: Married LIVES WITH WIFE Children: 1 SON  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M[employer] ;  [employer] Smoking Status:  recurrent  Review of Systems General:  Complains of fatigue and weakness; denies chills, fever, sweats, and weight loss. Eyes:  Denies blurring, discharge, and eye pain. ENT:  Complains of decreased hearing; denies earache and ringing in ears; has hearing aids.. CV:  Denies chest pain or discomfort, fainting, palpitations, shortness of breath with exertion, swelling of feet, and swelling of hands; occas tightness. Resp:  Denies cough, shortness of breath, and wheezing. GI:  Denies abdominal pain, bloody stools, change in bowel habits, constipation, dark tarry stools, diarrhea, indigestion, loss of appetite, nausea, vomiting, vomiting blood, and yellowish skin color. GU:  Denies discharge, dysuria, nocturia, and urinary frequency. MS:  Denies joint pain, low back pain, muscle aches, cramps, and stiffness. Derm:  Denies dryness, itching, and rash. Neuro:  Complains of poor balance; denies numbness, tingling, and tremors.  Physical Exam  General:  Well-developed,well-nourished,in no acute distress; alert,appropriate and cooperative throughout examination, overweight. Head:  Normocephalic and atraumatic without obvious abnormalities. No apparent alopecia or balding. Sinuses NT. Eyes:  Conjunctiva clear bilaterally. PERRLA, EOMI. Ears:  External ear exam shows no significant lesions or deformities.  Otoscopic examination reveals clear canals, tympanic membranes are intact bilaterally without bulging, retraction, inflammation or discharge. Hearing is mildly decreased bilaterally. Hearing aids in place bilat.Hearing aids in place bilat. Nose:  External nasal examination shows no deformity or inflammation. Nasal mucosa are pink and moist without lesions or exudates. Mouth:  Oral mucosa and oropharynx without lesions or exudates.  Teeth in good repair. Neck:  No deformities, masses, or tenderness noted. Carotids quiet. Chest Wall:  No deformities, masses,  tenderness or gynecomastia noted. Breasts:  No masses or gynecomastia noted Lungs:  Normal respiratory effort, chest expands symmetrically. Lungs are clear to auscultation, no crackles or wheezes. Heart:  Normal rate and regular rhythm. S1 and S2 normal without gallop, murmur, click, rub or other extra sounds. Abdomen:  Bowel sounds positive,abdomen soft and non-tender without masses, organomegaly or hernias noted. Mildly protuberant. Rectal:  No external abnormalities noted. Normal sphincter tone. No rectal masses or tenderness. G neg. Mild ext hemms deflated. Genitalia:  Testes bilaterally descended without nodularity, tenderness or masses. No scrotal masses or lesions. No penis lesions or urethral discharge. Prostate:  Prostate gland firm and smooth, no enlargement, nodularity, tenderness, mass, asymmetry or induration. 10 gms. Msk:  No deformity or scoliosis noted of thoracic or lumbar spine.   Pulses:  R and L carotid,radial,femoral,dorsalis pedis and posterior tibial pulses are full and equal bilaterally Extremities:  No clubbing, cyanosis, edema, or deformity noted with normal full range of motion of all joints.   Neurologic:  No cranial nerve deficits noted. Station and gait are normal. Sensory, motor and coordinative functions appear intact. Skin:  Intact without suspicious lesions or rashes Cervical Nodes:  No lymphadenopathy noted Inguinal Nodes:  No significant  adenopathy Psych:  Cognition and judgment appear intact. Alert and cooperative with normal attention span and concentration. No apparent delusions, illusions, hallucinations  Diabetes Management Exam:    Foot Exam (with socks and/or shoes not present):       Sensory-Monofilament:          Left foot: diminished          Right foot: diminished       Inspection:          Left foot: normal          Right foot: normal   Impression & Recommendations:  Problem # 1:  HEALTH MAINTENANCE EXAM (ICD-V70.0) Assessment Comment  Only  Reviewed preventive care protocols, scheduled due services, and updated immunizations.  Problem # 2:  DIABETES MELLITUS DX'D 2012 (ICD-250.00) Assessment: New  Discussed pathophysiology, risks and trmts to include diet/exercise and meds. Pt to monitor his FBS every AM for one month until seen. Avoid sweets and carbs totally as able until seen  Already has some peripheral neuropathy of the feet. Eye exam soon is important. His updated medication list for this problem includes:    Bayer Aspirin Ec Low Dose 81 Mg Tbec (Aspirin) .Marland Kitchen... 1 daily by mouth  Labs Reviewed: Creat: 0.7 (12/11/2010)    Reviewed HgBA1c results: 7.2 (12/11/2010)  Problem # 3:  PLANTAR FASCIITIS, LEFT (ICD-728.71) Assessment: Unchanged  Seeing Dr Al Corpus.  Problem # 4:  GERD (ICD-530.81) Assessment: Unchanged  Sxs stabel...will be helped by his diet/exercise helping him lose weight. His updated medication list for this problem includes:    Ranitidine Hcl 150 Mg Tabs (Ranitidine hcl) .Marland Kitchen... Take one by mouth daily as needed  Diagnostics Reviewed:  Discussed lifestyle modifications, diet, antacids/medications, and preventive measures. Handout provided.   Problem # 5:  HYPERLIPIDEMIA, MIXED (ICD-272.2) Assessment: Deteriorated Chol high, goal now changed due to the diabetes diagnosis. Will need a statin for risk reduction. Labs Reviewed: SGOT: 24 (12/11/2010)   SGPT: 27 (12/11/2010)   HDL:37.90 (12/11/2010), 39.40 (12/20/2009)  LDL:DEL (12/12/2008)  Chol:278 (12/11/2010), 251 (12/20/2009)  Trig:863.0 (12/11/2010), 476.0 (12/20/2009)  Complete Medication List: 1)  Allegra 180 Mg Tabs (Fexofenadine hcl) .Marland Kitchen.. 1 daily by mouth as needed 2)  Bayer Aspirin Ec Low Dose 81 Mg Tbec (Aspirin) .Marland Kitchen.. 1 daily by mouth 3)  Centrum Silver Tabs (Multiple vitamins-minerals) .... Take one by mouth daily 4)  Ranitidine Hcl 150 Mg Tabs (Ranitidine hcl) .... Take one by mouth daily as needed 5)  Omega-3 Krill Oil 300 Mg Caps  (Krill oil) .... Take one by mouth daily  Patient Instructions: 1)  RTC 1 month, bring diary. 2)  Get eye exam 3)  Quit Smoking. 4)  Start checking sugar first in the AM fasting.   Orders Added: 1)  Est. Patient 40-64 years [99396]    Current Allergies (reviewed today): No known allergies

## 2011-01-09 NOTE — Progress Notes (Signed)
Summary: form for diabetic supplies  Phone Note From Pharmacy   Caller: [facility] Summary of Call: Form for diabetic supplies is on your desk.  I checked with pt and he does want these supplies. Initial call taken by: Lowella Petties CMA, AAMA,  January 01, 2011 4:29 PM  Follow-up for Phone Call        Done. Thanks. Follow-up by: Shaune Leeks MD,  January 02, 2011 7:34 AM  Additional Follow-up for Phone Call Additional follow up Details #1::        Form faxed, placed up front for scanning. Additional Follow-up by: Lowella Petties CMA, AAMA,  January 02, 2011 8:22 AM

## 2011-01-17 ENCOUNTER — Encounter: Payer: Self-pay | Admitting: Family Medicine

## 2011-01-17 ENCOUNTER — Ambulatory Visit (INDEPENDENT_AMBULATORY_CARE_PROVIDER_SITE_OTHER): Payer: BC Managed Care – PPO | Admitting: Family Medicine

## 2011-01-17 DIAGNOSIS — E119 Type 2 diabetes mellitus without complications: Secondary | ICD-10-CM

## 2011-01-17 NOTE — Medication Information (Signed)
Summary: [facility]eter [employer] planner & Supplies Order   Imported By: Kassie Mends 01/09/2011 09:56:07  _____________________________________________________________________  External Attachment:    Type:   Image     Comment:   External Document

## 2011-01-23 NOTE — Assessment & Plan Note (Signed)
Summary: ROA FOR 1 MONTH FU/JRR   Vital Signs:  Patient profile:   50 year old male Weight:      202 pounds Temp:     98.3 degrees F oral Pulse rate:   64 / minute Pulse rhythm:   regular BP sitting:   104 / 64  (left arm) Cuff size:   large  Vitals Entered By: Sydell Axon LPN (January 17, 2011 9:09 AM) CC: One month follow-up on diabetes   History of Present Illness: He quit smoking the day he left here. He is being very careful with intake and is exercising regularly. He was diagnosed as diabetic last time with a Hgb A1C of 7.2 and FBS of 126. He has checked his FBS daily and typically runs 100-120s with one 140+. He feels well and has no complaints. He has lost significant weight since being here.  Problems Prior to Update: 1)  Special Screening Malignant Neoplasm of Prostate  (ICD-V76.44) 2)  Plantar Fasciitis, Left  (ICD-728.71) 3)  Health Maintenance Exam  (ICD-V70.0) 4)  Diabetes Mellitus Dx'd 2012  (ICD-250.00) 5)  Gerd  (ICD-530.81) 6)  Hyperlipidemia, Mixed  (ICD-272.2) 7)  Other Urethritis  (ICD-597.89) 8)  Sebaceous Cyst  (ICD-706.2)  Medications Prior to Update: 1)  Allegra 180 Mg Tabs (Fexofenadine Hcl) .Marland Kitchen.. 1 Daily By Mouth As Needed 2)  Bayer Aspirin Ec Low Dose 81 Mg Tbec (Aspirin) .Marland Kitchen.. 1 Daily By Mouth 3)  Centrum Silver  Tabs (Multiple Vitamins-Minerals) .... Take One By Mouth Daily 4)  Ranitidine Hcl 150 Mg Tabs (Ranitidine Hcl) .... Take One By Mouth Daily As Needed 5)  Omega-3 Krill Oil 300 Mg Caps (Krill Oil) .... Take One By Mouth Daily  Current Medications (verified): 1)  Allegra 180 Mg Tabs (Fexofenadine Hcl) .Marland Kitchen.. 1 Daily By Mouth As Needed 2)  Bayer Aspirin Ec Low Dose 81 Mg Tbec (Aspirin) .Marland Kitchen.. 1 Daily By Mouth 3)  Centrum Silver  Tabs (Multiple Vitamins-Minerals) .... Take One By Mouth Daily 4)  Ranitidine Hcl 150 Mg Tabs (Ranitidine Hcl) .... Take One By Mouth Daily As Needed 5)  Omega-3 Krill Oil 300 Mg Caps (Krill Oil) .... Take One By Mouth  Daily 6)  Gabapentin 100 Mg Caps (Gabapentin) .... Take One By Mouth At Bedtime 7)  Meloxicam 15 Mg Tabs (Meloxicam) .... Take One By Mouth Daily  Allergies: No Known Drug Allergies  Physical Exam  General:  Well-developed,well-nourished,in no acute distress; alert,appropriate and cooperative throughout examination. Head:  Normocephalic and atraumatic without obvious abnormalities. No apparent alopecia or balding. Sinuses NT. Eyes:  Conjunctiva clear bilaterally.  Ears:  External ear exam shows no significant lesions or deformities.  Otoscopic examination reveals clear canals, tympanic membranes are intact bilaterally without bulging, retraction, inflammation or discharge. Hearing is mildly decreased bilaterally. Hearing aids in place bilat.Hearing aids in place bilat. Nose:  External nasal examination shows no deformity or inflammation. Nasal mucosa are pink and moist without lesions or exudates. Mouth:  Oral mucosa and oropharynx without lesions or exudates.  Teeth in good repair. Neck:  No deformities, masses, or tenderness noted. Carotids quiet. Lungs:  Normal respiratory effort, chest expands symmetrically. Lungs are clear to auscultation, no crackles or wheezes. Heart:  Normal rate and regular rhythm. S1 and S2 normal without gallop, murmur, click, rub or other extra sounds. Abdomen:  Bowel sounds positive,abdomen soft and non-tender without masses, organomegaly or hernias noted. Mildly protuberant.  Diabetes Management Exam:    Eye Exam:  Eye Exam done elsewhere          Date: 12/26/2010          Results: normal          Done by: Dr Marlyne Beards   Impression & Recommendations:  Problem # 1:  DIABETES MELLITUS DX'D 2012 (ICD-250.00) Assessment Improved Doing well, has lost weight, is watching his diet and nos are generally good. Cont as is and start 4 day progression of monitoring. His updated medication list for this problem includes:    Bayer Aspirin Ec Low Dose 81 Mg Tbec  (Aspirin) .Marland Kitchen... 1 daily by mouth  Labs Reviewed: Creat: 0.7 (12/11/2010)     Last Eye Exam: normal (12/26/2010) Reviewed HgBA1c results: 7.2 (12/11/2010)  Complete Medication List: 1)  Allegra 180 Mg Tabs (Fexofenadine hcl) .Marland Kitchen.. 1 daily by mouth as needed 2)  Bayer Aspirin Ec Low Dose 81 Mg Tbec (Aspirin) .Marland Kitchen.. 1 daily by mouth 3)  Centrum Silver Tabs (Multiple vitamins-minerals) .... Take one by mouth daily 4)  Ranitidine Hcl 150 Mg Tabs (Ranitidine hcl) .... Take one by mouth daily as needed 5)  Omega-3 Krill Oil 300 Mg Caps (Krill oil) .... Take one by mouth daily 6)  Gabapentin 100 Mg Caps (Gabapentin) .... Take one by mouth at bedtime 7)  Meloxicam 15 Mg Tabs (Meloxicam) .... Take one by mouth daily  Patient Instructions: 1)  RTC 3 mos. A1C prior.250.00 2)  He will need ACEI and Statin in the near future.   Orders Added: 1)  Est. Patient Level III [16109]    Current Allergies (reviewed today): No known allergies

## 2011-01-29 NOTE — Letter (Signed)
Summary: [facility]   [facility]   Imported By: Kassie Mends 01/22/2011 08:06:14  _____________________________________________________________________  External Attachment:    Type:   Image     Comment:   External Document

## 2011-04-09 ENCOUNTER — Encounter: Payer: Self-pay | Admitting: Family Medicine

## 2011-04-09 ENCOUNTER — Other Ambulatory Visit: Payer: Self-pay | Admitting: Family Medicine

## 2011-04-12 ENCOUNTER — Other Ambulatory Visit (INDEPENDENT_AMBULATORY_CARE_PROVIDER_SITE_OTHER): Payer: BC Managed Care – PPO | Admitting: Family Medicine

## 2011-04-12 DIAGNOSIS — E119 Type 2 diabetes mellitus without complications: Secondary | ICD-10-CM

## 2011-04-12 LAB — HEMOGLOBIN A1C: Hgb A1c MFr Bld: 6.6 % — ABNORMAL HIGH (ref 4.6–6.5)

## 2011-04-16 NOTE — Assessment & Plan Note (Signed)
[facility]                                 ON-CALL NOTE   [name] [name], [name] [name]                      MRN:          [id number]  DATE:06/19/2008                            DOB:          12-23-1960    Patient of Dr. Hetty Ely, called from [phone] at 10:40 a.m. on June 19, 2008.   The patient saw Everrett Coombe on Friday for a cyst under his testicle and  he was put on Keflex 500 mg 4 times a day.  He feels like the swelling  is getting worse and he had a lot of pain and burning in the area.  He  is wondering if that is normal.  I explained while it may not be  improving yet because he has really only been on the antibiotics for 1  day; however, it should not be getting worse and he should go to an  urgent care or the emergency room to have them look at it.  The patient  agreed and will go to an urgent care.     Lelon Perla, DO  Electronically Signed    [name]  DD: 06/19/2008  DT: 06/20/2008  Job #: [id number]   cc:   Arta Silence, MD

## 2011-04-16 NOTE — Assessment & Plan Note (Signed)
[facility]   NAME:  [name] [name], [name] [name] NO.:  [id number]   MEDICAL RECORD NO.:  [id number]      DATE OF BIRTH:  05/24/1961   PHYSICIAN:  Maxwell Caul, M.D. VISIT DATE:  06/27/2008                                   OFFICE VISIT   [name] felt a small lump in his right groin 2 weeks ago.  He felt  this was a cyst.  He was seen in his primary care physician's office.  The cyst apparently had a small head on it, and he was prescribed oral  antibiotics.  Over the next 2 days, he developed increasing right  scrotal hemi swelling and increasing pain.  By Sunday, the pain was  extreme and erythema involved the scrotum and part of his penis.  He was  admitted to hospital in [facility].  He underwent an I&D by a  urologist.  Apparently, the culture was eventually negative.  He was  treated with vancomycin and Flagyl, and with further whirlpool and  packing.  He states the area in his scrotum is getting better every day,  and he is here for our review of this.   PHYSICAL EXAMINATION:  Temperature is 98.2, pulse 72, respirations 18,  and blood pressure is 141/77.  There is a small surgical area to the  right scrotum which measures 1.4 x 0.5 x 1.7.  This was a healthy-  looking surgical wound.  There has already been some closure here.  There is healthy granulation tissue.  No substance, no drainage, and no  evidence of infection was appreciated.  Palpation of the scrotum did not  reveal any abnormalities.   IMPRESSION:  Healthy-appearing post surgical wound, right hemi scrotum,  secondary to an abscess.  We continue to pack this with a plain packing.  I think this will probably heal nicely.  I did not appreciate any  underlying scrotal abnormalities.  We did make him an appointment for  followup in the Urology office.  However, in terms of the wound, this  looks like it is healing nicely.  He will be seen in Nurse Clinic next  week.      ______________________________  Maxwell Caul, M.D.     MGR/MEDQ  D:  06/27/2008  T:  06/28/2008  Job:  [id number]

## 2011-04-16 NOTE — Group Therapy Note (Signed)
NAME:  ADAM, SANJUAN NO.:  [id number]   MEDICAL RECORD NO.:  [id number]         PATIENT TYPE:  HREC   LOCATION:                                 FACILITY:   PHYSICIAN:  Lenon Curt. Chilton Si, M.D.  DATE OF BIRTH:  June 20, 1961                                 PROGRESS NOTE   WOUND CARE PROGRESS NOTE:   HISTORY:  A 50 year old male who underwent the drainage of a cyst and subsequently  cellulitis and open wound to the right scrotum, who was last seen here  on June 29, 2006.  He was referred to urology; however, that visit has  not taken place to date.  The patient is recovering well, and the area  of ulceration at the right scrotum appears to be nearly completely  healed at the present time.  There is very little if any drainage.  The  patient is comfortable, nontender and has not been running any fever.  He is voiding normally.   The wound is considered healed fully enough to warrant discharge from  the wound care clinic.  No return appointment is scheduled.  He is  advised to continue on the consultation with the urologist; however, we  do not expect much to change in the course of that visit either.      Lenon Curt Chilton Si, M.D.  Electronically Signed     AGG/MEDQ  D:  07/11/2008  T:  07/11/2008  Job:  [id number]

## 2011-04-17 ENCOUNTER — Ambulatory Visit (INDEPENDENT_AMBULATORY_CARE_PROVIDER_SITE_OTHER): Payer: BC Managed Care – PPO | Admitting: Family Medicine

## 2011-04-17 ENCOUNTER — Encounter: Payer: Self-pay | Admitting: Family Medicine

## 2011-04-17 DIAGNOSIS — E119 Type 2 diabetes mellitus without complications: Secondary | ICD-10-CM

## 2011-04-17 NOTE — Patient Instructions (Signed)
RTC one month forrecheck of chol/Trig,  fasting labs for lipid profile prior

## 2011-04-17 NOTE — Progress Notes (Signed)
  Subjective:    Patient ID: [name] [name], male    DOB: [birth year], 50 y.o.   MRN: [id number]  HPI Pt here for three month followup of DM. He has shown really good control. His 4 day progressive diary is generally 90s to 110s, very rare excursion. He feels well and has no complaints.    Review of SystemsNoncontributory except as above.       Objective:   Physical Exam  Constitutional: He appears well-developed and well-nourished. No distress.  HENT:  Head: Normocephalic and atraumatic.  Right Ear: External ear normal.  Left Ear: External ear normal.  Nose: Nose normal.  Mouth/Throat: Oropharynx is clear and moist.  Eyes: Conjunctivae and EOM are normal. Pupils are equal, round, and reactive to light. Right eye exhibits no discharge. Left eye exhibits no discharge.  Neck: Normal range of motion. Neck supple.  Cardiovascular: Normal rate and regular rhythm.   Pulmonary/Chest: Effort normal and breath sounds normal. He has no wheezes.  Lymphadenopathy:    He has no cervical adenopathy.  Skin: He is not diaphoretic.          Assessment & Plan:

## 2011-04-17 NOTE — Assessment & Plan Note (Addendum)
Nos much better. Think he has now become motivated to avoid complications by good control. Lab Results  Component Value Date   HGBA1C 6.6* 04/12/2011   Discussed need for ACEI and Statin therapy. He would like to hold off as long as  Poss. Will check lipid profile in one month and see him back to discuss what and how much to put him on as a statin.

## 2011-04-19 NOTE — Op Note (Signed)
NAME:  BUREL, KAHRE NO.:  [id number]   MEDICAL RECORD NO.:  [id number]          PATIENT TYPE:  AMB   LOCATION:  DSC                          FACILITY:  [facility]   PHYSICIAN:  Robert A. Thurston Hole, M.D. DATE OF BIRTH:  Jan 08, 1961   DATE OF PROCEDURE:  11/22/2005  DATE OF DISCHARGE:                                 OPERATIVE REPORT   PREOPERATIVE DIAGNOSIS:  Left knee medial and lateral meniscal tears with  chondromalacia.   POSTOPERATIVE DIAGNOSIS:  Left knee medial and lateral meniscal tears with  chondromalacia.   PROCEDURES:  1.  Left knee EOA, followed by arthroscopic partial medial and lateral      meniscectomies.  2.  Left knee chondroplasty.   SURGEON:  Elana Alm. Thurston Hole, M.D.   ASSISTANT:  Julien Girt, P.A.   ANESTHESIA:  General.   OPERATIVE TIME:  30 minutes.   COMPLICATIONS:  None.   INDICATIONS FOR PROCEDURE:  Mr. Marlette is a 50 year old gentleman who has  had 2-3 months of increasing left knee pain; with exam and MRI documenting  medial meniscus tear with chondromalacia. He has failed conservative care  and is now to undergo arthroscopy.   DESCRIPTION OF PROCEDURE:  Mr. Loberg is brought to the operating room on  November 22, 2005; placed on the operative table in the supine position.  After an adequate level of general anesthesia was obtained, his left knee  was examined. He had full range of motion.  The knee was stable ligamentous  exam with normal patellar tracking. Knee was sterilely injected with 0.25%  Marcaine with epinephrine. The left leg was prepped in usual sterile  DuraPrep and draped using a sterile technique. Originally, through an  anterolateral portal, the arthroscope with a pump attached was placed into  an anteromedial portal. An arthroscopic probe was placed. On initial  inspection of the medial compartment, he was found to have 75% grade 3  chondromalacia on the medial femoral condyle, which was debrided.  The  medial tibial plateau showed minimal changes.  The medial meniscus tear at  posterior horn, of which 50% was resected back to a stable rim.  Intercondylar notch inspected; anterior and posterior cruciate ligaments  were normal.  Lateral compartment inspected. He had a 30% grade 3  chondromalacia in the lateral tibial plateau, which was debrided. Minimal  changes in the lateral femoral condyle. Partial tear posterior medial root  of the lateral meniscus; 20% was resected back to a stable rim.  Patellofemoral joint articular cartilage was normal. The patella tracked  normally. Medial and lateral gutters were free of pathology. After this was  done it was felt that all pathology had been satisfactorily addressed. The  instruments were  removed. The portals were closed with 3-0 nylon suture and  injected with 0.25% Marcaine with epinephrine, and 4 mg of morphine. Sterile  dressings were applied and the patient awakened and taken to recovery room  in stable condition.   FOLLOW-UP CARE:  Mr. Bigley will be followed as an outpatient on Vicodin and  Naprosyn. He should come back to the office in  1  week for sutures out and  follow-up.      Robert A. Thurston Hole, M.D.  Electronically Signed     RAW/MEDQ  D:  11/22/2005  T:  11/24/2005  Job:  [id number]

## 2011-06-10 ENCOUNTER — Other Ambulatory Visit: Payer: BC Managed Care – PPO

## 2011-06-12 ENCOUNTER — Ambulatory Visit: Payer: BC Managed Care – PPO | Admitting: Family Medicine

## 2011-06-17 ENCOUNTER — Other Ambulatory Visit (INDEPENDENT_AMBULATORY_CARE_PROVIDER_SITE_OTHER): Payer: BC Managed Care – PPO | Admitting: Family Medicine

## 2011-06-17 DIAGNOSIS — E782 Mixed hyperlipidemia: Secondary | ICD-10-CM

## 2011-06-17 DIAGNOSIS — E119 Type 2 diabetes mellitus without complications: Secondary | ICD-10-CM

## 2011-06-17 LAB — LDL CHOLESTEROL, DIRECT: Direct LDL: 180 mg/dL

## 2011-06-17 LAB — MICROALBUMIN / CREATININE URINE RATIO
Creatinine,U: 152.7 mg/dL
Microalb Creat Ratio: 1.4 mg/g (ref 0.0–30.0)
Microalb, Ur: 2.2 mg/dL — ABNORMAL HIGH (ref 0.0–1.9)

## 2011-06-17 NOTE — Progress Notes (Signed)
Addended by: Baldomero Lamy on: 06/17/2011 08:26 AM   Modules accepted: Orders

## 2011-06-17 NOTE — Progress Notes (Signed)
Addended by: Baldomero Lamy on: 06/17/2011 09:52 AM   Modules accepted: Orders

## 2011-06-19 ENCOUNTER — Encounter: Payer: Self-pay | Admitting: Family Medicine

## 2011-06-19 ENCOUNTER — Ambulatory Visit (INDEPENDENT_AMBULATORY_CARE_PROVIDER_SITE_OTHER): Payer: BC Managed Care – PPO | Admitting: Family Medicine

## 2011-06-19 VITALS — BP 108/68 | HR 60 | Temp 98.7°F | Wt 205.0 lb

## 2011-06-19 DIAGNOSIS — L723 Sebaceous cyst: Secondary | ICD-10-CM

## 2011-06-19 DIAGNOSIS — E782 Mixed hyperlipidemia: Secondary | ICD-10-CM

## 2011-06-19 DIAGNOSIS — E119 Type 2 diabetes mellitus without complications: Secondary | ICD-10-CM

## 2011-06-19 MED ORDER — SIMVASTATIN 40 MG PO TABS: 40.0000 mg | ORAL_TABLET | Freq: Every evening | ORAL | Status: DC

## 2011-06-19 NOTE — Assessment & Plan Note (Signed)
Long discussion about monitoring and diet and exercise and medications. Will hold off on ACEI at this point as he has had some erection difficulties and will try not to add to them at this point. We discussed ED at length as well, going through pathophysiology and risks.  A1C last time 6.6 Will check again next time.

## 2011-06-19 NOTE — Assessment & Plan Note (Addendum)
LDl high, will start Simva 40. Recheck in three months. Trig much better than historically. He has been watching his sweets and carbs carefully. HDL acceptable.

## 2011-06-19 NOTE — Progress Notes (Signed)
  Subjective:    Patient ID: [name] [name], male    DOB: 04/17/1961, 50 y.o.   MRN: [id number]  HPI Pt here for followup. He has diabetes and hyperchol and has gotten motivated with his sugar. His last A1C went from 7.2 to 6.6. He wants to avoid ACEI and Statins as long as possible. We checked his Chol profile this time.  After long discussion about diet and monitoring sugar and exercise and nos,  he said he uses Splenda in his coffee and has and occas beer.     Review of Systems  Constitutional: Negative for fever, chills, diaphoresis, activity change, appetite change and fatigue.  HENT: Negative for hearing loss, ear pain, congestion, sore throat, rhinorrhea, neck pain, neck stiffness, postnasal drip, sinus pressure, tinnitus and ear discharge.   Eyes: Negative for pain, discharge and visual disturbance.  Respiratory: Negative for cough, shortness of breath and wheezing.   Cardiovascular: Negative for chest pain and palpitations.       No SOB w/ exertion  Gastrointestinal:       No heartburn or swallowing problems.  Genitourinary:       No nocturia  Skin:       No itching or dryness.  Neurological:       No tingling or balance problems.  All other systems reviewed and are negative.       Objective:   Physical Exam No exam        Assessment & Plan:

## 2011-06-19 NOTE — Assessment & Plan Note (Signed)
Put on Doxycycline for what appears like one of these on the left posterior neck by Derm, who he saw yesterday.

## 2011-06-19 NOTE — Patient Instructions (Signed)
SGOT, SGPT 272.4    6 WEEKS See me in 3 mos, SGOT, SGPT, CHOL PROFILE  272.4    A1C 250.00 prior

## 2011-09-12 ENCOUNTER — Other Ambulatory Visit: Payer: Self-pay | Admitting: Family Medicine

## 2011-09-12 DIAGNOSIS — N342 Other urethritis: Secondary | ICD-10-CM

## 2011-09-12 DIAGNOSIS — E782 Mixed hyperlipidemia: Secondary | ICD-10-CM

## 2011-09-19 ENCOUNTER — Other Ambulatory Visit (INDEPENDENT_AMBULATORY_CARE_PROVIDER_SITE_OTHER): Payer: BC Managed Care – PPO

## 2011-09-19 DIAGNOSIS — E782 Mixed hyperlipidemia: Secondary | ICD-10-CM

## 2011-09-19 DIAGNOSIS — N342 Other urethritis: Secondary | ICD-10-CM

## 2011-09-19 DIAGNOSIS — E119 Type 2 diabetes mellitus without complications: Secondary | ICD-10-CM

## 2011-09-19 LAB — RENAL FUNCTION PANEL
Albumin: 4.4 g/dL (ref 3.5–5.2)
CO2: 27 mEq/L (ref 19–32)
Chloride: 105 mEq/L (ref 96–112)
GFR: 103.46 mL/min (ref >60.00)
Phosphorus: 3.3 mg/dL (ref 2.3–4.6)
Potassium: 4.6 mEq/L (ref 3.5–5.1)

## 2011-09-19 LAB — CBC WITH DIFFERENTIAL/PLATELET
Basophils Relative: 0.5 % (ref 0.0–3.0)
Hemoglobin: 13.9 g/dL (ref 13.0–17.0)
Lymphocytes Relative: 35.4 % (ref 12.0–46.0)
MCHC: 34.2 g/dL (ref 30.0–36.0)
Monocytes Relative: 7.5 % (ref 3.0–12.0)
Neutro Abs: 3.8 10*3/uL (ref 1.4–7.7)
RBC: 4.63 Mil/uL (ref 4.22–5.81)

## 2011-09-19 LAB — LIPID PANEL
Cholesterol: 220 mg/dL — ABNORMAL HIGH (ref 0–200)
Total CHOL/HDL Ratio: 5

## 2011-09-19 LAB — HEPATIC FUNCTION PANEL
ALT: 35 U/L (ref 0–53)
AST: 27 U/L (ref 0–37)
Albumin: 4.4 g/dL (ref 3.5–5.2)
Alkaline Phosphatase: 45 U/L (ref 39–117)
Total Protein: 7.6 g/dL (ref 6.0–8.3)

## 2011-09-19 LAB — MICROALBUMIN / CREATININE URINE RATIO: Creatinine,U: 134.8 mg/dL

## 2011-09-25 ENCOUNTER — Ambulatory Visit (INDEPENDENT_AMBULATORY_CARE_PROVIDER_SITE_OTHER): Payer: BC Managed Care – PPO | Admitting: Family Medicine

## 2011-09-25 ENCOUNTER — Encounter: Payer: Self-pay | Admitting: Family Medicine

## 2011-09-25 DIAGNOSIS — E782 Mixed hyperlipidemia: Secondary | ICD-10-CM

## 2011-09-25 DIAGNOSIS — E119 Type 2 diabetes mellitus without complications: Secondary | ICD-10-CM

## 2011-09-25 NOTE — Assessment & Plan Note (Signed)
Good response with LDL much lower but still not quite acceptable due to DM. Will give some time as weight loss should help lower this as well. Trigs too high but will probably respond to weight loss as well.  Will have him rechecked at PE in Jan. Lab Results  Component Value Date   CHOL 220* 09/19/2011   CHOL 251* 06/17/2011   CHOL 278* 12/11/2010   Lab Results  Component Value Date   HDL 40.20 09/19/2011   HDL 41.70 06/17/2011   HDL 16.10* 12/11/2010   No results found for this basename: LDLCALC   Lab Results  Component Value Date   TRIG 381.0* 09/19/2011   TRIG 237.0* 06/17/2011   TRIG 863.0* 12/11/2010   Lab Results  Component Value Date   CHOLHDL 5 09/19/2011   CHOLHDL 6 06/17/2011   CHOLHDL 7 12/11/2010   Lab Results  Component Value Date   LDLDIRECT 119.9 09/19/2011   LDLDIRECT 180.0 06/17/2011   LDLDIRECT 112.5 12/11/2010

## 2011-09-25 NOTE — Progress Notes (Signed)
  Subjective:    Patient ID: [name] [name], male    DOB: 1961-02-21, 50 y.o.   MRN: [id number]  HPI Pt here for three month followup. He had been very suuccessful at losing weight but is slowly gaining it back. Has gained 13 pounds in three months. He knows things have been slipping away from him. He knows he can do better and has proven he can lose weight. We started him on Simva last time for Chol. He has tolerated this well with no complaints.    Review of SystemsNoncontributory except as above.       Objective:   Physical Exam  Constitutional: He appears well-developed and well-nourished. No distress.  HENT:  Head: Normocephalic and atraumatic.  Right Ear: External ear normal.  Left Ear: External ear normal.  Nose: Nose normal.  Mouth/Throat: Oropharynx is clear and moist.  Eyes: Conjunctivae and EOM are normal. Pupils are equal, round, and reactive to light. Right eye exhibits no discharge. Left eye exhibits no discharge.  Neck: Normal range of motion. Neck supple.  Cardiovascular: Normal rate and regular rhythm.   Pulmonary/Chest: Effort normal and breath sounds normal. He has no wheezes.  Lymphadenopathy:    He has no cervical adenopathy.  Skin: He is not diaphoretic.          Assessment & Plan:

## 2011-09-25 NOTE — Assessment & Plan Note (Signed)
Lab Results  Component Value Date   HGBA1C 6.8* 09/19/2011  Control has slipped. Discussed at length. He thinks he can get things under control again through diet and exercise. Will give him a few months and have things rechecked for PE in Jan. Kidney liver and thyroid all ok today.

## 2011-09-25 NOTE — Patient Instructions (Addendum)
RTC Jan  for COMP exam, labs prior with Dr Para March.

## 2011-12-12 ENCOUNTER — Other Ambulatory Visit: Payer: Self-pay | Admitting: Family Medicine

## 2011-12-12 DIAGNOSIS — E119 Type 2 diabetes mellitus without complications: Secondary | ICD-10-CM

## 2011-12-17 ENCOUNTER — Other Ambulatory Visit (INDEPENDENT_AMBULATORY_CARE_PROVIDER_SITE_OTHER): Payer: BC Managed Care – PPO

## 2011-12-17 DIAGNOSIS — E119 Type 2 diabetes mellitus without complications: Secondary | ICD-10-CM

## 2011-12-17 LAB — LIPID PANEL
HDL: 39.1 mg/dL (ref >39.00)
Total CHOL/HDL Ratio: 5
Triglycerides: 498 mg/dL — ABNORMAL HIGH (ref 0.0–149.0)

## 2011-12-17 LAB — HEMOGLOBIN A1C: Hgb A1c MFr Bld: 7.2 % — ABNORMAL HIGH (ref 4.6–6.5)

## 2011-12-24 ENCOUNTER — Encounter: Payer: BC Managed Care – PPO | Admitting: Family Medicine

## 2011-12-27 ENCOUNTER — Encounter: Payer: BC Managed Care – PPO | Admitting: Family Medicine

## 2012-01-03 ENCOUNTER — Telehealth: Payer: Self-pay | Admitting: Family Medicine

## 2012-01-08 ENCOUNTER — Ambulatory Visit: Payer: BC Managed Care – PPO | Admitting: Family Medicine

## 2012-01-14 ENCOUNTER — Encounter: Payer: BC Managed Care – PPO | Admitting: Family Medicine

## 2012-01-27 ENCOUNTER — Ambulatory Visit (INDEPENDENT_AMBULATORY_CARE_PROVIDER_SITE_OTHER): Payer: BC Managed Care – PPO | Admitting: Family Medicine

## 2012-01-27 ENCOUNTER — Encounter: Payer: Self-pay | Admitting: Family Medicine

## 2012-01-27 DIAGNOSIS — E119 Type 2 diabetes mellitus without complications: Secondary | ICD-10-CM

## 2012-01-27 DIAGNOSIS — E782 Mixed hyperlipidemia: Secondary | ICD-10-CM

## 2012-01-27 DIAGNOSIS — R351 Nocturia: Secondary | ICD-10-CM

## 2012-01-27 DIAGNOSIS — E669 Obesity, unspecified: Secondary | ICD-10-CM

## 2012-01-27 NOTE — Patient Instructions (Addendum)
Call the eye clinic for a check.  Google the American Diabetes Association and look at ideas for snacks and exercise.  Work on [employer] and exercise.  Recheck A1c at physical in June.

## 2012-01-27 NOTE — Progress Notes (Signed)
Diabetes:  no medicine Hypoglycemic episodes: no Hyperglycemic episodes:no Feet problems: tingling in toes Blood Sugars averaging: rarely checked eye exam within last year: about a year ago, due for recheck  Elevated Cholesterol: Using medications without problems:yes Muscle aches: no Diet compliance: "I was doing good" but then he fell off with diet.  His wife is helping him now.  Exercise: limited, work is getting in his way.  He's trying to adjust.   PMH and SH reviewed  Meds, vitals, and allergies reviewed.   ROS: See HPI.  Otherwise negative.    GEN: nad, alert and oriented HEENT: mucous membranes moist NECK: supple w/o LA CV: rrr. PULM: ctab, no inc wob ABD: soft, +bs EXT: no edema SKIN: no acute rash  Diabetic foot exam: Normal inspection No skin breakdown No calluses  Normal DP pulses Dec sensation to light touch and monofilament on plantar side of toes on L foot Nails normal

## 2012-01-28 ENCOUNTER — Encounter: Payer: Self-pay | Admitting: Family Medicine

## 2012-01-28 DIAGNOSIS — E669 Obesity, unspecified: Secondary | ICD-10-CM | POA: Insufficient documentation

## 2012-01-28 NOTE — Assessment & Plan Note (Signed)
D/w pt about weight loss.  

## 2012-01-28 NOTE — Assessment & Plan Note (Signed)
>  25 min spent with face to face with patient, >50% counseling and/or coordinating care D/w pt about DM2, diet, exercise, weight, meds.  Will continue with diet and exercise for now.  Recheck 3 months.  He understood.   Likely with early neuropathy.

## 2012-01-28 NOTE — Assessment & Plan Note (Signed)
Labs d/w pt.  Continue meds and work on weight.  He understood.

## 2012-02-03 ENCOUNTER — Other Ambulatory Visit: Payer: Self-pay | Admitting: *Deleted

## 2012-02-03 MED ORDER — SIMVASTATIN 40 MG PO TABS: 40.0000 mg | ORAL_TABLET | Freq: Every evening | ORAL | Status: DC

## 2012-05-14 ENCOUNTER — Other Ambulatory Visit (INDEPENDENT_AMBULATORY_CARE_PROVIDER_SITE_OTHER): Payer: BC Managed Care – PPO

## 2012-05-14 DIAGNOSIS — R351 Nocturia: Secondary | ICD-10-CM

## 2012-05-14 DIAGNOSIS — E119 Type 2 diabetes mellitus without complications: Secondary | ICD-10-CM

## 2012-05-14 LAB — COMPREHENSIVE METABOLIC PANEL
ALT: 43 U/L (ref 0–53)
Albumin: 4.3 g/dL (ref 3.5–5.2)
CO2: 25 mEq/L (ref 19–32)
Calcium: 9.5 mg/dL (ref 8.4–10.5)
Chloride: 102 mEq/L (ref 96–112)
GFR: 119.68 mL/min (ref >60.00)
Glucose, Bld: 131 mg/dL — ABNORMAL HIGH (ref 70–99)
Potassium: 4.6 mEq/L (ref 3.5–5.1)
Sodium: 138 mEq/L (ref 135–145)
Total Bilirubin: 0.9 mg/dL (ref 0.3–1.2)
Total Protein: 7.1 g/dL (ref 6.0–8.3)

## 2012-05-14 LAB — LIPID PANEL: Total CHOL/HDL Ratio: 5

## 2012-05-14 LAB — MICROALBUMIN / CREATININE URINE RATIO: Microalb Creat Ratio: 2.1 mg/g (ref 0.0–30.0)

## 2012-05-21 ENCOUNTER — Ambulatory Visit (INDEPENDENT_AMBULATORY_CARE_PROVIDER_SITE_OTHER): Payer: BC Managed Care – PPO | Admitting: Family Medicine

## 2012-05-21 ENCOUNTER — Encounter: Payer: Self-pay | Admitting: Family Medicine

## 2012-05-21 VITALS — BP 138/84 | HR 79 | Temp 98.2°F | Wt 213.8 lb

## 2012-05-21 DIAGNOSIS — E1149 Type 2 diabetes mellitus with other diabetic neurological complication: Secondary | ICD-10-CM

## 2012-05-21 DIAGNOSIS — E782 Mixed hyperlipidemia: Secondary | ICD-10-CM

## 2012-05-21 DIAGNOSIS — E1142 Type 2 diabetes mellitus with diabetic polyneuropathy: Secondary | ICD-10-CM

## 2012-05-21 DIAGNOSIS — Z0000 Encounter for general adult medical examination without abnormal findings: Secondary | ICD-10-CM

## 2012-05-21 MED ORDER — SIMVASTATIN 40 MG PO TABS: 40.0000 mg | ORAL_TABLET | Freq: Every evening | ORAL | Status: DC

## 2012-05-21 NOTE — Progress Notes (Signed)
CPE- See plan.  Routine anticipatory guidance given to patient.  See health maintenance. Tetanus 2005 Flu shot encouraged Colon 2011 PSA wnl Living will d/w pt.  He'll look into updating.   Labs d/w pt.   Elevated Cholesterol: Using medications without problems: yes Muscle aches: no Diet compliance: yes Exercise: no discussed.   Labs d/w pt.  He's losing weight.    Diabetes:  Using medications without difficulties: no meds Hypoglycemic episodes: no Hyperglycemic episodes: no Feet problems: h/o tingling in the feet, noted over the last few years  Blood Sugars averaging: occ checked, ~130 eye exam within last year: 03/2012  PMH and SH reviewed  Meds, vitals, and allergies reviewed.   ROS: See HPI.  Otherwise negative.    GEN: nad, alert and oriented HEENT: mucous membranes moist NECK: supple w/o LA CV: rrr. PULM: ctab, no inc wob ABD: soft, +bs EXT: no edema SKIN: no acute rash Prostate gland firm and smooth, no enlargement, nodularity, tenderness, mass, asymmetry or induration.  Diabetic foot exam: Normal inspection No skin breakdown Calluses  Normal DP pulses Normal sensation to light touch and monofilament Nails normal

## 2012-05-21 NOTE — Assessment & Plan Note (Signed)
Needs to continue work on diet and exercise more. Not on meds, metformin would be reasonable if A1c isn't improved.  Recheck A1c in 4 months.

## 2012-05-21 NOTE — Patient Instructions (Addendum)
I would get a flu shot each fall.   Recheck A1c in 4 months before a recheck.  Take care.  Keep exercising and working on your diet.

## 2012-05-21 NOTE — Assessment & Plan Note (Signed)
Needs to continue work on diet and exercise more. Continue current meds.

## 2012-07-20 ENCOUNTER — Encounter: Payer: Self-pay | Admitting: Family Medicine

## 2012-07-20 ENCOUNTER — Ambulatory Visit (INDEPENDENT_AMBULATORY_CARE_PROVIDER_SITE_OTHER): Payer: BC Managed Care – PPO | Admitting: Family Medicine

## 2012-07-20 VITALS — BP 150/88 | HR 74 | Temp 98.5°F | Wt 219.8 lb

## 2012-07-20 DIAGNOSIS — H7390 Unspecified disorder of tympanic membrane, unspecified ear: Secondary | ICD-10-CM | POA: Insufficient documentation

## 2012-07-20 NOTE — Assessment & Plan Note (Signed)
D/w pt.  No fungal elements noted.  Likely mild irritation and he declined auralgan in meantime.  This should resolve; likely related to new ear piece.  Would not use hearing aid for a few days and then gradually restart.  He agrees.  No apparent infection to treat at this time.

## 2012-07-20 NOTE — Progress Notes (Signed)
L ear pain, was seen at ear clinic.  Was told he had a fungal infection.  Had used hearing aid some, also had been at the beach in the interval.  He was getting better over the last 2 weeks, but then some pain recently- since last night.  No FCNAVD.  No ear drainage.  No rhinorrhea.  No R ear symptoms.  Hearing is at baseline.  He does have new ear pieces for the hearing aids.    Meds, vitals, and allergies reviewed.   ROS: See HPI.  Otherwise, noncontributory.  nad ncat B canal and pinna wnl, R TM wnl, but L TM with some superior irritation but no bulging erythema. L TM is wnl over the inferior 80% Mastoid not ttp x2 Nasal and op exam wnl

## 2012-09-15 ENCOUNTER — Other Ambulatory Visit (INDEPENDENT_AMBULATORY_CARE_PROVIDER_SITE_OTHER): Payer: BC Managed Care – PPO

## 2012-09-15 DIAGNOSIS — E1142 Type 2 diabetes mellitus with diabetic polyneuropathy: Secondary | ICD-10-CM

## 2012-09-15 DIAGNOSIS — E1149 Type 2 diabetes mellitus with other diabetic neurological complication: Secondary | ICD-10-CM

## 2012-09-15 LAB — HEMOGLOBIN A1C: Hgb A1c MFr Bld: 7.3 % — ABNORMAL HIGH (ref 4.6–6.5)

## 2012-09-22 ENCOUNTER — Ambulatory Visit: Payer: BC Managed Care – PPO | Admitting: Family Medicine

## 2012-10-07 ENCOUNTER — Ambulatory Visit (INDEPENDENT_AMBULATORY_CARE_PROVIDER_SITE_OTHER): Payer: BC Managed Care – PPO | Admitting: Family Medicine

## 2012-10-07 ENCOUNTER — Encounter: Payer: Self-pay | Admitting: Family Medicine

## 2012-10-07 VITALS — BP 124/70 | HR 80 | Temp 98.6°F | Wt 212.8 lb

## 2012-10-07 DIAGNOSIS — E1142 Type 2 diabetes mellitus with diabetic polyneuropathy: Secondary | ICD-10-CM

## 2012-10-07 DIAGNOSIS — Z23 Encounter for immunization: Secondary | ICD-10-CM

## 2012-10-07 DIAGNOSIS — E1149 Type 2 diabetes mellitus with other diabetic neurological complication: Secondary | ICD-10-CM

## 2012-10-07 DIAGNOSIS — E119 Type 2 diabetes mellitus without complications: Secondary | ICD-10-CM

## 2012-10-07 NOTE — Assessment & Plan Note (Signed)
Normal exam today (except for L 1st nail) but with burning noted by patient.  Refer for DM2 teaching, recheck 3 months.  If not improved, will need consideration of metformin.  He agrees.  D/w pt.

## 2012-10-07 NOTE — Patient Instructions (Addendum)
Don't change your meds for now.   Work on [employer]. See Shirlee Limerick about your referral before you leave today.  Check with your insurance about coverage.  Recheck fasting labs in 3 months before a visit.  Take care.  Glad to see you.

## 2012-10-07 NOTE — Progress Notes (Signed)
Diabetes:  Using medications without difficulties:no meds Hypoglycemic episodes: not checked Hyperglycemic episodes: notchecked Feet problems: he's had more foot pain, has been on his feet more often.  Nighttime his feet are sensitive, L>R, some burning.   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    Meds, vitals, and allergies reviewed.   ROS: See HPI.  Otherwise negative.    GEN: nad, alert and oriented HEENT: mucous membranes moist NECK: supple w/o LA CV: rrr. PULM: ctab, no inc wob ABD: soft, +bs EXT: no edema SKIN: no acute rash  Diabetic foot exam: Normal inspection No skin breakdown No calluses  Normal DP pulses Normal sensation to light touch and monofilament Nails normal except for L 1st nail regrowing (had fallen off after stubbing toe prev)

## 2012-10-15 ENCOUNTER — Telehealth: Payer: Self-pay

## 2012-10-15 NOTE — Telephone Encounter (Signed)
Pt request records sent to Dr Malvin Johns neurologist at [facility]. Pt will come by office and sign record release.

## 2013-01-01 ENCOUNTER — Other Ambulatory Visit (INDEPENDENT_AMBULATORY_CARE_PROVIDER_SITE_OTHER): Payer: BC Managed Care – PPO

## 2013-01-01 DIAGNOSIS — E119 Type 2 diabetes mellitus without complications: Secondary | ICD-10-CM

## 2013-01-01 LAB — COMPREHENSIVE METABOLIC PANEL
Albumin: 4.3 g/dL (ref 3.5–5.2)
Alkaline Phosphatase: 45 U/L (ref 39–117)
BUN: 16 mg/dL (ref 6–23)
Glucose, Bld: 163 mg/dL — ABNORMAL HIGH (ref 70–99)
Total Bilirubin: 0.7 mg/dL (ref 0.3–1.2)

## 2013-01-01 LAB — LIPID PANEL
HDL: 36.1 mg/dL — ABNORMAL LOW (ref >39.00)
Triglycerides: 507 mg/dL — ABNORMAL HIGH (ref 0.0–149.0)

## 2013-01-01 LAB — LDL CHOLESTEROL, DIRECT: Direct LDL: 101.1 mg/dL

## 2013-01-01 LAB — HEMOGLOBIN A1C: Hgb A1c MFr Bld: 7.9 % — ABNORMAL HIGH (ref 4.6–6.5)

## 2013-01-07 ENCOUNTER — Encounter: Payer: Self-pay | Admitting: Family Medicine

## 2013-01-07 ENCOUNTER — Ambulatory Visit (INDEPENDENT_AMBULATORY_CARE_PROVIDER_SITE_OTHER): Payer: BC Managed Care – PPO | Admitting: Family Medicine

## 2013-01-07 VITALS — BP 142/78 | HR 84 | Temp 98.1°F | Wt 218.8 lb

## 2013-01-07 DIAGNOSIS — E119 Type 2 diabetes mellitus without complications: Secondary | ICD-10-CM

## 2013-01-07 DIAGNOSIS — E1149 Type 2 diabetes mellitus with other diabetic neurological complication: Secondary | ICD-10-CM

## 2013-01-07 DIAGNOSIS — E1142 Type 2 diabetes mellitus with diabetic polyneuropathy: Secondary | ICD-10-CM

## 2013-01-07 MED ORDER — METFORMIN HCL 500 MG PO TABS: ORAL_TABLET | ORAL | Status: DC

## 2013-01-07 NOTE — Patient Instructions (Addendum)
I would get back to walking and start working on your diet.   Look at diabetes.org.  Start the metformin and recheck your A1c in about 3 months.   Come see me after that.

## 2013-01-07 NOTE — Progress Notes (Signed)
Diabetes:  Using medications without difficulties: no meds Hypoglycemic episodes: not checked Hyperglycemic episodes:not checked Feet problems: he feet feel better, less pain at night.   Blood Sugars averaging:not checked Exercise has been limited.  He's planning on working on this.   We discussed, labs weight and diet.    Meds, vitals, and allergies reviewed.   ROS: See HPI.  Otherwise negative.    GEN: nad, alert and oriented HEENT: mucous membranes moist NECK: supple w/o LA CV: rrr. PULM: ctab, no inc wob ABD: soft, +bs EXT: no edema SKIN: no acute rash  Diabetic foot exam: Normal inspection No skin breakdown No calluses  Normal DP pulses Normal sensation to light touch and monofilament Nails normal

## 2013-01-08 NOTE — Assessment & Plan Note (Signed)
His foot pain is better.  We'll follow this clinically.  He has normal foot exam today.  He'll work on diet and slowly titrate up the metformin, up to 4 tabs a day with GI caution.  Recheck in 3 months.  He agrees.  Labs d/w pt.

## 2013-02-16 ENCOUNTER — Encounter: Payer: Self-pay | Admitting: Family Medicine

## 2013-02-16 ENCOUNTER — Ambulatory Visit (INDEPENDENT_AMBULATORY_CARE_PROVIDER_SITE_OTHER): Payer: BC Managed Care – PPO | Admitting: Family Medicine

## 2013-02-16 VITALS — BP 130/78 | HR 86 | Temp 98.4°F

## 2013-02-16 DIAGNOSIS — R21 Rash and other nonspecific skin eruption: Secondary | ICD-10-CM | POA: Insufficient documentation

## 2013-02-16 MED ORDER — SULFAMETHOXAZOLE-TRIMETHOPRIM 800-160 MG PO TABS: 2.0000 | ORAL_TABLET | Freq: Two times a day (BID) | ORAL | Status: DC

## 2013-02-16 MED ORDER — TRIAMCINOLONE ACETONIDE 0.1 % EX CREA: TOPICAL_CREAM | Freq: Two times a day (BID) | CUTANEOUS | Status: DC | PRN

## 2013-02-16 NOTE — Progress Notes (Signed)
He was able to tolerate 500mg  of metformin BID. Due for recheck A1c later in 2014.    Had been cutting wood (possible poison ivy on the wood). Rash started a few days later.  B arm rash.  L arm is itchy with some red splotches.  R antecubital fossa with a nonitchy rash for a few days; it doesn't feel similar to the smaller itchy areas. No fevers.   Meds, vitals, and allergies reviewed.   ROS: See HPI.  Otherwise, noncontributory.  nad Normal ROM of the elbows and hands. Normal radial pulses distally x2 10x14 cm area on R proximal forearm with blanching erythema and a cluster of papules w/o fluctuant mass.  Also with scattered slightly excoriated blanching round red macules in nondermatomal distribution on the B forearms.

## 2013-02-16 NOTE — Patient Instructions (Addendum)
Use the cream on the itch spots but not on the red area on your right arm.  Start the antibiotics today and call back tomorrow with an update.  The biggest red area should gradually get smaller.

## 2013-02-16 NOTE — Assessment & Plan Note (Addendum)
Likely poison ivy now with R forearm cellulitis but no sign of compartment.  Nontoxic.  TAC for poison ivy and septra for the cellulitis.  No lesion to culture, I&D.  No fevers.   He'll call back with update.  Border marked and f/u cautions given.

## 2013-03-03 ENCOUNTER — Ambulatory Visit (INDEPENDENT_AMBULATORY_CARE_PROVIDER_SITE_OTHER): Payer: BC Managed Care – PPO | Admitting: Family Medicine

## 2013-03-03 ENCOUNTER — Encounter: Payer: Self-pay | Admitting: Family Medicine

## 2013-03-03 VITALS — BP 120/78 | HR 80 | Temp 98.5°F | Wt 216.5 lb

## 2013-03-03 DIAGNOSIS — R21 Rash and other nonspecific skin eruption: Secondary | ICD-10-CM

## 2013-03-03 NOTE — Patient Instructions (Signed)
No charge for visit.   If the area continue to slowly heal over, then I wouldn't do anything else.   If it doesn't fully heal, then call me.

## 2013-03-03 NOTE — Progress Notes (Signed)
R forearm with persisting 3 cm lesion that is scabbed, at the antecubital fossa at site of prev papule cluster.  It is getting better slowly, some better/smaller each day. The other skin lesions have resolved totally.  He wanted the arm rechecked.  He feels well o/w.   Meds, vitals, and allergies reviewed.   ROS: See HPI.  Otherwise, noncontributory.  nad ncat R forearm with persisting 3 cm area that is scabbed, at the antecubital fossa.  No fluctuant mass.  No open ulceration.

## 2013-03-04 NOTE — Assessment & Plan Note (Signed)
This looks to be a hold over from the prev event and is slowly healing.  Wouldn't intervene at this time.  If continues to heal and smooths over, then no f/u needed.  If not, then he'll call back and we can set up derm eval.  He agrees.  Doesn't appear infected.  No charge due to brevity of encounter.

## 2013-04-06 ENCOUNTER — Other Ambulatory Visit (INDEPENDENT_AMBULATORY_CARE_PROVIDER_SITE_OTHER): Payer: BC Managed Care – PPO

## 2013-04-06 DIAGNOSIS — E119 Type 2 diabetes mellitus without complications: Secondary | ICD-10-CM

## 2013-04-06 LAB — HEMOGLOBIN A1C: Hgb A1c MFr Bld: 7.2 % — ABNORMAL HIGH (ref 4.6–6.5)

## 2013-04-13 ENCOUNTER — Ambulatory Visit (INDEPENDENT_AMBULATORY_CARE_PROVIDER_SITE_OTHER): Payer: BC Managed Care – PPO | Admitting: Family Medicine

## 2013-04-13 ENCOUNTER — Encounter: Payer: Self-pay | Admitting: Family Medicine

## 2013-04-13 VITALS — BP 108/80 | HR 82 | Temp 98.1°F | Wt 210.5 lb

## 2013-04-13 DIAGNOSIS — E1149 Type 2 diabetes mellitus with other diabetic neurological complication: Secondary | ICD-10-CM

## 2013-04-13 DIAGNOSIS — E1142 Type 2 diabetes mellitus with diabetic polyneuropathy: Secondary | ICD-10-CM

## 2013-04-13 DIAGNOSIS — E119 Type 2 diabetes mellitus without complications: Secondary | ICD-10-CM

## 2013-04-13 DIAGNOSIS — L989 Disorder of the skin and subcutaneous tissue, unspecified: Secondary | ICD-10-CM

## 2013-04-13 NOTE — Patient Instructions (Addendum)
Call dermatology about getting your arm checked.  Recheck A1c in 3 months and then come see me. Take care.  Keep working your weight and diet.

## 2013-04-13 NOTE — Progress Notes (Signed)
Diabetes:  Using medications without difficulties:yes Hypoglycemic episodes:no sx Hyperglycemic episodes:no sx Feet problems:no Blood Sugars averaging: not checked eye exam within last year: due, discussed.  He's down 6 lbs intentionally.  He has a treadmill and is planning on getting more exercise.   He's working on diet.  Cutting portions and increased fruit, cut out soda and added water.   Meds, vitals, and allergies reviewed.   ROS: See HPI.  Otherwise negative.    GEN: nad, alert and oriented HEENT: mucous membranes moist NECK: supple w/o LA CV: rrr. PULM: ctab, no inc wob ABD: soft, +bs EXT: no edema SKIN: 1cm ulcerated lesion on R upper arm present for about 2-3 weeks.  Antecubital lesion on the R arm is healed and scarred.   Diabetic foot exam: Normal inspection No skin breakdown No calluses  Normal DP pulses Normal sensation to light touch and monofilament Nails normal

## 2013-04-14 DIAGNOSIS — L989 Disorder of the skin and subcutaneous tissue, unspecified: Secondary | ICD-10-CM | POA: Insufficient documentation

## 2013-04-14 NOTE — Assessment & Plan Note (Signed)
He'll f/;u with derm.  

## 2013-04-14 NOTE — Assessment & Plan Note (Signed)
His foot sx are improved.  A1c improved.  Continue with diet and exercise and recheck in 3 months.  He agrees.  Labs d/w pt.

## 2013-04-20 ENCOUNTER — Encounter: Payer: Self-pay | Admitting: Family Medicine

## 2013-04-20 ENCOUNTER — Ambulatory Visit (INDEPENDENT_AMBULATORY_CARE_PROVIDER_SITE_OTHER): Payer: BC Managed Care – PPO | Admitting: Family Medicine

## 2013-04-20 VITALS — BP 120/80 | HR 86 | Temp 98.4°F | Wt 211.5 lb

## 2013-04-20 DIAGNOSIS — J069 Acute upper respiratory infection, unspecified: Secondary | ICD-10-CM

## 2013-04-20 NOTE — Patient Instructions (Addendum)
Drink plenty of fluids, take tylenol as needed, and gargle with warm salt water for your throat.  This should gradually improve.  Take care.  Let us know if you have other concerns.    

## 2013-04-20 NOTE — Progress Notes (Signed)
Night before last woke up with a cough and pain with coughing.  Occ sputum.  No fevers.  Diffuse aches on the head and neck.  No vomiting, diarrhea.  Face is congested. Some tooth pain.  Son is sick with similar.  Taking some nyquil and mucinex with some relief.   Meds, vitals, and allergies reviewed.   ROS: See HPI.  Otherwise, noncontributory.  GEN: nad, alert and oriented HEENT: mucous membranes moist, tm w/o erythema, nasal exam w/o erythema, clear discharge noted,  OP with cobblestoning, sinuses not ttp x4 NECK: supple w/o LA CV: rrr.   PULM: ctab, no inc wob EXT: no edema SKIN: no acute rash

## 2013-04-21 DIAGNOSIS — J069 Acute upper respiratory infection, unspecified: Secondary | ICD-10-CM | POA: Insufficient documentation

## 2013-04-21 NOTE — Assessment & Plan Note (Signed)
Likely viral, nontoxic, f/u prn.  Supportive measures.

## 2013-06-07 ENCOUNTER — Encounter: Payer: Self-pay | Admitting: Family Medicine

## 2013-06-10 ENCOUNTER — Other Ambulatory Visit: Payer: Self-pay

## 2013-07-08 ENCOUNTER — Other Ambulatory Visit (INDEPENDENT_AMBULATORY_CARE_PROVIDER_SITE_OTHER): Payer: BC Managed Care – PPO

## 2013-07-08 DIAGNOSIS — E119 Type 2 diabetes mellitus without complications: Secondary | ICD-10-CM

## 2013-07-13 ENCOUNTER — Telehealth: Payer: Self-pay

## 2013-07-13 MED ORDER — METFORMIN HCL 500 MG PO TABS: 500.0000 mg | ORAL_TABLET | Freq: Two times a day (BID) | ORAL | Status: DC

## 2013-07-13 MED ORDER — SIMVASTATIN 40 MG PO TABS: 40.0000 mg | ORAL_TABLET | Freq: Every evening | ORAL | Status: DC

## 2013-07-13 NOTE — Telephone Encounter (Signed)
Pt left note requesting refill metformin, simvastatin and metanx; refilled metformin and simvastatin; pt notified. Metanx will not allow me to refill needs new rx. Pt has appt with Dr Para March on 07/15/13 but pt request note sent to Dr Para March to refill Metanx to [facility];Please advise.

## 2013-07-13 NOTE — Telephone Encounter (Signed)
Advised patient as instructed. 

## 2013-07-13 NOTE — Telephone Encounter (Signed)
Left message asking patient to call back

## 2013-07-13 NOTE — Telephone Encounter (Signed)
Let me talk to him about the metanx at the [facility].  We can fill and send then if needed.  Thanks.

## 2013-07-15 ENCOUNTER — Encounter: Payer: Self-pay | Admitting: Family Medicine

## 2013-07-15 ENCOUNTER — Ambulatory Visit (INDEPENDENT_AMBULATORY_CARE_PROVIDER_SITE_OTHER): Payer: BC Managed Care – PPO | Admitting: Family Medicine

## 2013-07-15 VITALS — BP 146/80 | HR 92 | Temp 98.3°F | Wt 214.5 lb

## 2013-07-15 DIAGNOSIS — E1142 Type 2 diabetes mellitus with diabetic polyneuropathy: Secondary | ICD-10-CM

## 2013-07-15 DIAGNOSIS — E1149 Type 2 diabetes mellitus with other diabetic neurological complication: Secondary | ICD-10-CM

## 2013-07-15 MED ORDER — L-METHYLFOLATE-B6-B12 3-35-2 MG PO TABS: 1.0000 | ORAL_TABLET | Freq: Two times a day (BID) | ORAL | Status: DC

## 2013-07-15 NOTE — Progress Notes (Signed)
Diabetes:  Using medications without difficulties: yes Hypoglycemic episodes:no sx Hyperglycemic episodes:no sx Feet problems:no Blood Sugars averaging: not checked frequently Had been on metanx for the foot tingling.  It had helped.   He's going to work on stopping smoking.  He's going to start taking his lunch to work.  Discussed.   Meds, vitals, and allergies reviewed.   ROS: See HPI.  Otherwise negative.    GEN: nad, alert and oriented HEENT: mucous membranes moist NECK: supple w/o LA CV: rrr. PULM: ctab, no inc wob ABD: soft, +bs EXT: no edema SKIN: no acute rash  Diabetic foot exam: Normal inspection No skin breakdown No calluses  Normal DP pulses Normal sensation to light touch and monofilament Nails normal

## 2013-07-15 NOTE — Patient Instructions (Addendum)
Check your dose on the metanx and verify it with me.  I can send it in then.   Recheck your sugar in about 3 months here before a visit.  Look at diabetes.org for diet ideas.   Don't change your meds for now.

## 2013-07-15 NOTE — Assessment & Plan Note (Signed)
Foot exam improved, continue current meds, he'll verify his metanx rx dose.   He'll work on diet and smoking, will pack lunch and work on exercise. Recheck in about 3 months.  He agrees.

## 2013-07-20 ENCOUNTER — Telehealth: Payer: Self-pay

## 2013-07-20 NOTE — Telephone Encounter (Signed)
Pt seen 07/15/13 and was to call back confirming instructions for Metanx; pt takes 1 tab by mouth twice a day. Pt request refills to go to [facility].Please advise.

## 2013-07-21 MED ORDER — L-METHYLFOLATE-B6-B12 3-35-2 MG PO TABS: 1.0000 | ORAL_TABLET | Freq: Two times a day (BID) | ORAL | Status: DC

## 2013-07-21 NOTE — Telephone Encounter (Signed)
Sent. Thanks.   

## 2013-07-30 NOTE — Telephone Encounter (Signed)
Pharmacy Tech from [facility] called saying that the Rx for Metanx is not available in tablets and asks that we change the prescription to capsules. Change authorized and new Rx give to pharmacist Corrie Dandy) over the phone.

## 2013-10-05 ENCOUNTER — Other Ambulatory Visit: Payer: Self-pay | Admitting: Family Medicine

## 2013-10-05 DIAGNOSIS — E1149 Type 2 diabetes mellitus with other diabetic neurological complication: Secondary | ICD-10-CM

## 2013-10-11 ENCOUNTER — Other Ambulatory Visit: Payer: BC Managed Care – PPO

## 2013-10-12 ENCOUNTER — Ambulatory Visit (INDEPENDENT_AMBULATORY_CARE_PROVIDER_SITE_OTHER): Payer: BC Managed Care – PPO | Admitting: Podiatry

## 2013-10-12 ENCOUNTER — Encounter: Payer: Self-pay | Admitting: Podiatry

## 2013-10-12 ENCOUNTER — Ambulatory Visit (INDEPENDENT_AMBULATORY_CARE_PROVIDER_SITE_OTHER): Payer: BC Managed Care – PPO

## 2013-10-12 VITALS — BP 140/88 | HR 78 | Resp 16 | Ht 70.0 in | Wt 212.0 lb

## 2013-10-12 DIAGNOSIS — S92919A Unspecified fracture of unspecified toe(s), initial encounter for closed fracture: Secondary | ICD-10-CM

## 2013-10-12 DIAGNOSIS — S92912A Unspecified fracture of left toe(s), initial encounter for closed fracture: Secondary | ICD-10-CM

## 2013-10-12 DIAGNOSIS — M79675 Pain in left toe(s): Secondary | ICD-10-CM

## 2013-10-12 DIAGNOSIS — M79609 Pain in unspecified limb: Secondary | ICD-10-CM

## 2013-10-12 NOTE — Progress Notes (Signed)
Khayree presents today as a 52 year old white male with injuries to toes 34 and 5 of the left foot over the past 2-3 days her he kicked the leg of his dresser. He states that he kicked the dresser so hard he thought he rectus toes off. He denies any changes past history medications and allergies.  Objective: Vital signs are stable he is alert and oriented x3. Pulses remain palpable left lower extremity. Fifth digit of the left foot is extremely swollen and painful on palpation. He has tenderness on palpation to the third and fourth digits. Radiographic evaluation demonstrates an oblique fracture nondisplaced non-comminuted to the third digit of the left foot. Along the proximal phalanx. The fourth toe does not appear to have her fracture to it. However the fifth digit does demonstrate fracture to the distal intermediate and proximal phalanges.  Assessment: Fracture toes #3 and #5 of the left foot.  Plan: Wrap the fifth digit with Caban on a daily basis. Demonstrated this to him today making sure there was not too tight. Also dispensed a Darco shoe which she will wear for the next 4 weeks. He will reappoint in 4 weeks for x-rays if needed.

## 2013-10-15 ENCOUNTER — Ambulatory Visit: Payer: BC Managed Care – PPO | Admitting: Family Medicine

## 2013-10-19 ENCOUNTER — Ambulatory Visit (INDEPENDENT_AMBULATORY_CARE_PROVIDER_SITE_OTHER): Payer: BC Managed Care – PPO

## 2013-10-19 DIAGNOSIS — Z23 Encounter for immunization: Secondary | ICD-10-CM

## 2013-11-08 ENCOUNTER — Other Ambulatory Visit (INDEPENDENT_AMBULATORY_CARE_PROVIDER_SITE_OTHER): Payer: BC Managed Care – PPO

## 2013-11-08 DIAGNOSIS — E1149 Type 2 diabetes mellitus with other diabetic neurological complication: Secondary | ICD-10-CM

## 2013-11-08 DIAGNOSIS — E1142 Type 2 diabetes mellitus with diabetic polyneuropathy: Secondary | ICD-10-CM

## 2013-11-08 LAB — COMPREHENSIVE METABOLIC PANEL
AST: 24 U/L (ref 0–37)
Albumin: 4.3 g/dL (ref 3.5–5.2)
Alkaline Phosphatase: 41 U/L (ref 39–117)
BUN: 12 mg/dL (ref 6–23)
Creatinine, Ser: 0.7 mg/dL (ref 0.4–1.5)
Potassium: 4.4 mEq/L (ref 3.5–5.1)
Sodium: 135 mEq/L (ref 135–145)
Total Protein: 7.3 g/dL (ref 6.0–8.3)

## 2013-11-08 LAB — LIPID PANEL
HDL: 38.9 mg/dL — ABNORMAL LOW (ref >39.00)
Total CHOL/HDL Ratio: 6
Triglycerides: 847 mg/dL — ABNORMAL HIGH (ref 0.0–149.0)

## 2013-11-08 LAB — MICROALBUMIN / CREATININE URINE RATIO
Creatinine,U: 171.7 mg/dL
Microalb Creat Ratio: 8.5 mg/g (ref 0.0–30.0)

## 2013-11-08 LAB — HEMOGLOBIN A1C: Hgb A1c MFr Bld: 8.1 % — ABNORMAL HIGH (ref 4.6–6.5)

## 2013-11-11 ENCOUNTER — Encounter: Payer: Self-pay | Admitting: Family Medicine

## 2013-11-11 ENCOUNTER — Ambulatory Visit (INDEPENDENT_AMBULATORY_CARE_PROVIDER_SITE_OTHER): Payer: BC Managed Care – PPO | Admitting: Family Medicine

## 2013-11-11 VITALS — BP 142/82 | HR 85 | Temp 98.2°F | Wt 212.0 lb

## 2013-11-11 DIAGNOSIS — E1149 Type 2 diabetes mellitus with other diabetic neurological complication: Secondary | ICD-10-CM

## 2013-11-11 DIAGNOSIS — E1142 Type 2 diabetes mellitus with diabetic polyneuropathy: Secondary | ICD-10-CM

## 2013-11-11 DIAGNOSIS — E119 Type 2 diabetes mellitus without complications: Secondary | ICD-10-CM

## 2013-11-11 DIAGNOSIS — Z23 Encounter for immunization: Secondary | ICD-10-CM

## 2013-11-11 DIAGNOSIS — E782 Mixed hyperlipidemia: Secondary | ICD-10-CM

## 2013-11-11 MED ORDER — LISINOPRIL 2.5 MG PO TABS: 2.5000 mg | ORAL_TABLET | Freq: Every day | ORAL | Status: DC

## 2013-11-11 NOTE — Assessment & Plan Note (Signed)
Continue statin, he'll work more on diet in the meantime.  Discussed labs and plan. He agrees.

## 2013-11-11 NOTE — Patient Instructions (Signed)
Start taking taking lisinopril to help your kidneys.  Work on [employer], cut back on beer.  Recheck A1c in about 3 months.   Take care.

## 2013-11-11 NOTE — Assessment & Plan Note (Signed)
Normal exam today, he'll work more on diet and not change DM2 meds today.  Add on ACE, d/w pt.  He agrees. Recheck in about 3-4 months.

## 2013-11-11 NOTE — Progress Notes (Signed)
Pre-visit discussion using our clinic review tool. No additional management support is needed unless otherwise documented below in the visit note.  Diabetes:  Using medications without difficulties: yes Hypoglycemic episodes: rare Hyperglycemic episodes:yes, see labs.   Feet problems:at baseline, no troubles .  Blood Sugars averaging: not checked often eye exam within last year:yes Mother had died and father has been ill.  This is likely related to his stress level and diet/sugar .  MALB d/w pt.   Elevated Cholesterol: Using medications without problems:yes Muscle aches: no Diet compliance:see above Exercise:see above  PMH and SH reviewed.   Vital signs, Meds and allergies reviewed.  ROS: See HPI.  Otherwise nontributory.   GEN: nad, alert and oriented HEENT: mucous membranes moist NECK: supple w/o LA CV: rrr.  no murmur PULM: ctab, no inc wob ABD: soft, +bs EXT: no edema SKIN: no acute rash  Diabetic foot exam: Normal inspection No skin breakdown No calluses  Normal DP pulses Normal sensation to light tough and monofilament Nails normal

## 2013-12-03 ENCOUNTER — Encounter (HOSPITAL_COMMUNITY): Payer: Self-pay | Admitting: Emergency Medicine

## 2013-12-03 ENCOUNTER — Emergency Department (HOSPITAL_COMMUNITY): Payer: BC Managed Care – PPO

## 2013-12-03 ENCOUNTER — Ambulatory Visit: Payer: BC Managed Care – PPO | Admitting: Internal Medicine

## 2013-12-03 ENCOUNTER — Encounter (HOSPITAL_COMMUNITY)
Admission: EM | Disposition: A | Discharge status: Home / Self Care | Payer: BC Managed Care – PPO | Source: Home / Self Care | Attending: Cardiology

## 2013-12-03 ENCOUNTER — Inpatient Hospital Stay (HOSPITAL_COMMUNITY)
Admission: EM | Admit: 2013-12-03 | Discharge: 2013-12-04 | DRG: 247 | Disposition: A | Discharge status: Home / Self Care | Payer: BC Managed Care – PPO | Attending: Cardiology | Admitting: Cardiology

## 2013-12-03 DIAGNOSIS — I219 Acute myocardial infarction, unspecified: Secondary | ICD-10-CM

## 2013-12-03 DIAGNOSIS — Z9861 Coronary angioplasty status: Secondary | ICD-10-CM

## 2013-12-03 DIAGNOSIS — Z87891 Personal history of nicotine dependence: Secondary | ICD-10-CM | POA: Diagnosis present

## 2013-12-03 DIAGNOSIS — Z7982 Long term (current) use of aspirin: Secondary | ICD-10-CM

## 2013-12-03 DIAGNOSIS — E119 Type 2 diabetes mellitus without complications: Secondary | ICD-10-CM

## 2013-12-03 DIAGNOSIS — E785 Hyperlipidemia, unspecified: Secondary | ICD-10-CM

## 2013-12-03 DIAGNOSIS — I2119 ST elevation (STEMI) myocardial infarction involving other coronary artery of inferior wall: Secondary | ICD-10-CM | POA: Diagnosis present

## 2013-12-03 DIAGNOSIS — E1149 Type 2 diabetes mellitus with other diabetic neurological complication: Secondary | ICD-10-CM

## 2013-12-03 DIAGNOSIS — I2510 Atherosclerotic heart disease of native coronary artery without angina pectoris: Secondary | ICD-10-CM

## 2013-12-03 DIAGNOSIS — E782 Mixed hyperlipidemia: Secondary | ICD-10-CM

## 2013-12-03 DIAGNOSIS — Z8249 Family history of ischemic heart disease and other diseases of the circulatory system: Secondary | ICD-10-CM

## 2013-12-03 DIAGNOSIS — Z7902 Long term (current) use of antithrombotics/antiplatelets: Secondary | ICD-10-CM

## 2013-12-03 DIAGNOSIS — I10 Essential (primary) hypertension: Secondary | ICD-10-CM | POA: Diagnosis present

## 2013-12-03 DIAGNOSIS — F17200 Nicotine dependence, unspecified, uncomplicated: Secondary | ICD-10-CM | POA: Diagnosis present

## 2013-12-03 DIAGNOSIS — F1010 Alcohol abuse, uncomplicated: Secondary | ICD-10-CM | POA: Diagnosis present

## 2013-12-03 DIAGNOSIS — Z955 Presence of coronary angioplasty implant and graft: Secondary | ICD-10-CM

## 2013-12-03 DIAGNOSIS — I517 Cardiomegaly: Secondary | ICD-10-CM

## 2013-12-03 DIAGNOSIS — E1142 Type 2 diabetes mellitus with diabetic polyneuropathy: Secondary | ICD-10-CM

## 2013-12-03 DIAGNOSIS — Z888 Allergy status to other drugs, medicaments and biological substances status: Secondary | ICD-10-CM

## 2013-12-03 DIAGNOSIS — Z833 Family history of diabetes mellitus: Secondary | ICD-10-CM

## 2013-12-03 DIAGNOSIS — Z8051 Family history of malignant neoplasm of kidney: Secondary | ICD-10-CM

## 2013-12-03 DIAGNOSIS — I213 ST elevation (STEMI) myocardial infarction of unspecified site: Secondary | ICD-10-CM

## 2013-12-03 DIAGNOSIS — Z79899 Other long term (current) drug therapy: Secondary | ICD-10-CM

## 2013-12-03 HISTORY — DX: Atherosclerotic heart disease of native coronary artery without angina pectoris: I25.10

## 2013-12-03 HISTORY — DX: ST elevation (STEMI) myocardial infarction involving other coronary artery of inferior wall: I21.19

## 2013-12-03 HISTORY — PX: LEFT HEART CATHETERIZATION WITH CORONARY ANGIOGRAM: SHX5451

## 2013-12-03 HISTORY — PX: PERCUTANEOUS CORONARY STENT INTERVENTION (PCI-S): SHX6016

## 2013-12-03 HISTORY — DX: ST elevation (STEMI) myocardial infarction of unspecified site: I21.3

## 2013-12-03 HISTORY — DX: Essential (primary) hypertension: I10

## 2013-12-03 LAB — BASIC METABOLIC PANEL
BUN: 9 mg/dL (ref 6–23)
CHLORIDE: 96 meq/L (ref 96–112)
CO2: 20 mEq/L (ref 19–32)
Calcium: 9.9 mg/dL (ref 8.4–10.5)
Creatinine, Ser: 0.58 mg/dL (ref 0.50–1.35)
Glucose, Bld: 273 mg/dL — ABNORMAL HIGH (ref 70–99)
POTASSIUM: 4.3 meq/L (ref 3.7–5.3)
SODIUM: 135 meq/L — AB (ref 137–147)

## 2013-12-03 LAB — CBC
HCT: 39.9 % (ref 39.0–52.0)
HEMOGLOBIN: 14.2 g/dL (ref 13.0–17.0)
MCH: 30.3 pg (ref 26.0–34.0)
MCHC: 35.6 g/dL (ref 30.0–36.0)
MCV: 85.3 fL (ref 78.0–100.0)
Platelets: 239 10*3/uL (ref 150–400)
RBC: 4.68 MIL/uL (ref 4.22–5.81)
RDW: 12.7 % (ref 11.5–15.5)
WBC: 10.4 10*3/uL (ref 4.0–10.5)

## 2013-12-03 LAB — POCT I-STAT, CHEM 8
BUN: 8 mg/dL (ref 6–23)
CALCIUM ION: 1.21 mmol/L (ref 1.12–1.23)
Chloride: 103 mEq/L (ref 96–112)
Creatinine, Ser: 0.7 mg/dL (ref 0.50–1.35)
Glucose, Bld: 267 mg/dL — ABNORMAL HIGH (ref 70–99)
HEMATOCRIT: 41 % (ref 39.0–52.0)
Hemoglobin: 13.9 g/dL (ref 13.0–17.0)
POTASSIUM: 3.9 meq/L (ref 3.7–5.3)
Sodium: 136 mEq/L — ABNORMAL LOW (ref 137–147)
TCO2: 20 mmol/L (ref 0–100)

## 2013-12-03 LAB — DIFFERENTIAL
BASOS ABS: 0 10*3/uL (ref 0.0–0.1)
BASOS PCT: 0 % (ref 0–1)
EOS PCT: 2 % (ref 0–5)
Eosinophils Absolute: 0.2 10*3/uL (ref 0.0–0.7)
LYMPHS ABS: 5.2 10*3/uL — AB (ref 0.7–4.0)
Lymphocytes Relative: 50 % — ABNORMAL HIGH (ref 12–46)
Monocytes Absolute: 0.8 10*3/uL (ref 0.1–1.0)
Monocytes Relative: 8 % (ref 3–12)
NEUTROS ABS: 4.2 10*3/uL (ref 1.7–7.7)
Neutrophils Relative %: 40 % — ABNORMAL LOW (ref 43–77)

## 2013-12-03 LAB — GLUCOSE, CAPILLARY
GLUCOSE-CAPILLARY: 188 mg/dL — AB (ref 70–99)
Glucose-Capillary: 227 mg/dL — ABNORMAL HIGH (ref 70–99)
Glucose-Capillary: 187 mg/dL — ABNORMAL HIGH (ref 70–99)
Glucose-Capillary: 218 mg/dL — ABNORMAL HIGH (ref 70–99)

## 2013-12-03 LAB — TROPONIN I
Troponin I: >20 ng/mL (ref <0.30)
Troponin I: >20 ng/mL (ref <0.30)

## 2013-12-03 LAB — TSH: TSH: 1.337 u[IU]/mL (ref 0.350–4.500)

## 2013-12-03 LAB — POCT I-STAT TROPONIN I: TROPONIN I, POC: 0 ng/mL (ref 0.00–0.08)

## 2013-12-03 LAB — LIPID PANEL
Cholesterol: 218 mg/dL — ABNORMAL HIGH (ref 0–200)
LDL CALC: UNDETERMINED mg/dL (ref 0–99)
TRIGLYCERIDES: 1262 mg/dL — AB (ref <150)
VLDL: UNDETERMINED mg/dL (ref 0–40)

## 2013-12-03 LAB — PROTIME-INR
INR: 0.78 (ref 0.00–1.49)
PROTHROMBIN TIME: 10.8 s — AB (ref 11.6–15.2)

## 2013-12-03 LAB — HEMOGLOBIN A1C
Hgb A1c MFr Bld: 8.1 % — ABNORMAL HIGH (ref <5.7)
Mean Plasma Glucose: 186 mg/dL — ABNORMAL HIGH (ref <117)

## 2013-12-03 LAB — MRSA PCR SCREENING: MRSA by PCR: NEGATIVE

## 2013-12-03 LAB — MAGNESIUM: Magnesium: 1.9 mg/dL (ref 1.5–2.5)

## 2013-12-03 LAB — APTT: APTT: 23 s — AB (ref 24–37)

## 2013-12-03 LAB — POCT ACTIVATED CLOTTING TIME: ACTIVATED CLOTTING TIME: 337 s

## 2013-12-03 SURGERY — LEFT HEART CATHETERIZATION WITH CORONARY ANGIOGRAM
Anesthesia: LOCAL

## 2013-12-03 MED ORDER — SODIUM CHLORIDE 0.9 % IV SOLN
0.2500 mg/kg/h | INTRAVENOUS | Status: AC
  Administered 2013-12-03: 0.25 mg/kg/h via INTRAVENOUS
  Filled 2013-12-03: qty 250

## 2013-12-03 MED ORDER — NITROGLYCERIN 0.4 MG SL SUBL
0.4000 mg | SUBLINGUAL_TABLET | SUBLINGUAL | Status: DC | PRN
  Administered 2013-12-03: 0.4 mg via SUBLINGUAL
  Filled 2013-12-03: qty 25

## 2013-12-03 MED ORDER — METOPROLOL TARTRATE 12.5 MG HALF TABLET
12.5000 mg | ORAL_TABLET | Freq: Two times a day (BID) | ORAL | Status: DC
  Administered 2013-12-03 – 2013-12-04 (×3): 12.5 mg via ORAL
  Filled 2013-12-03 (×4): qty 1

## 2013-12-03 MED ORDER — BIVALIRUDIN 250 MG IV SOLR
INTRAVENOUS | Status: AC
  Filled 2013-12-03: qty 250

## 2013-12-03 MED ORDER — TICAGRELOR 90 MG PO TABS
ORAL_TABLET | ORAL | Status: AC
  Filled 2013-12-03: qty 2

## 2013-12-03 MED ORDER — SIMVASTATIN 40 MG PO TABS
40.0000 mg | ORAL_TABLET | Freq: Every evening | ORAL | Status: DC
  Filled 2013-12-03: qty 1

## 2013-12-03 MED ORDER — MORPHINE SULFATE 4 MG/ML IJ SOLN
INTRAMUSCULAR | Status: AC
Start: 2013-12-03 — End: 2013-12-03
  Filled 2013-12-03: qty 1

## 2013-12-03 MED ORDER — ASPIRIN 81 MG PO CHEW
CHEWABLE_TABLET | ORAL | Status: AC
  Filled 2013-12-03: qty 4

## 2013-12-03 MED ORDER — THIAMINE HCL 100 MG/ML IJ SOLN
100.0000 mg | Freq: Every day | INTRAMUSCULAR | Status: DC
  Filled 2013-12-03 (×2): qty 1

## 2013-12-03 MED ORDER — LORAZEPAM 1 MG PO TABS
1.0000 mg | ORAL_TABLET | Freq: Four times a day (QID) | ORAL | Status: DC | PRN
  Administered 2013-12-03: 1 mg via ORAL
  Filled 2013-12-03: qty 1

## 2013-12-03 MED ORDER — MIDAZOLAM HCL 2 MG/2ML IJ SOLN
INTRAMUSCULAR | Status: AC
  Filled 2013-12-03: qty 2

## 2013-12-03 MED ORDER — SODIUM CHLORIDE 0.9 % IV SOLN: INTRAVENOUS | Status: AC

## 2013-12-03 MED ORDER — ONDANSETRON HCL 4 MG/2ML IJ SOLN: 4.0000 mg | Freq: Four times a day (QID) | INTRAMUSCULAR | Status: DC | PRN

## 2013-12-03 MED ORDER — INSULIN ASPART 100 UNIT/ML ~~LOC~~ SOLN: 0.0000 [IU] | Freq: Every day | SUBCUTANEOUS | Status: DC

## 2013-12-03 MED ORDER — LISINOPRIL 2.5 MG PO TABS
2.5000 mg | ORAL_TABLET | Freq: Every day | ORAL | Status: DC
  Administered 2013-12-03 – 2013-12-04 (×2): 2.5 mg via ORAL
  Filled 2013-12-03 (×2): qty 1

## 2013-12-03 MED ORDER — ALUM & MAG HYDROXIDE-SIMETH 200-200-20 MG/5ML PO SUSP
30.0000 mL | ORAL | Status: DC | PRN
  Administered 2013-12-03: 30 mL via ORAL
  Filled 2013-12-03: qty 30

## 2013-12-03 MED ORDER — VITAMIN B-1 100 MG PO TABS
100.0000 mg | ORAL_TABLET | Freq: Every day | ORAL | Status: DC
  Administered 2013-12-03 – 2013-12-04 (×2): 100 mg via ORAL
  Filled 2013-12-03 (×2): qty 1

## 2013-12-03 MED ORDER — ASPIRIN 300 MG RE SUPP
300.0000 mg | RECTAL | Status: AC
  Filled 2013-12-03: qty 1

## 2013-12-03 MED ORDER — ASPIRIN 81 MG PO CHEW: 324.0000 mg | CHEWABLE_TABLET | ORAL | Status: AC

## 2013-12-03 MED ORDER — MORPHINE SULFATE 2 MG/ML IJ SOLN: 2.0000 mg | INTRAMUSCULAR | Status: DC | PRN

## 2013-12-03 MED ORDER — ATORVASTATIN CALCIUM 80 MG PO TABS
80.0000 mg | ORAL_TABLET | Freq: Every day | ORAL | Status: DC
  Administered 2013-12-03: 80 mg via ORAL
  Filled 2013-12-03 (×2): qty 1

## 2013-12-03 MED ORDER — ADULT MULTIVITAMIN W/MINERALS CH
1.0000 | ORAL_TABLET | Freq: Every day | ORAL | Status: DC
  Administered 2013-12-03 – 2013-12-04 (×2): 1 via ORAL
  Filled 2013-12-03 (×2): qty 1

## 2013-12-03 MED ORDER — ENOXAPARIN SODIUM 40 MG/0.4ML ~~LOC~~ SOLN
40.0000 mg | SUBCUTANEOUS | Status: DC
  Filled 2013-12-03: qty 0.4

## 2013-12-03 MED ORDER — LIDOCAINE HCL (PF) 1 % IJ SOLN
INTRAMUSCULAR | Status: AC
  Filled 2013-12-03: qty 30

## 2013-12-03 MED ORDER — INSULIN ASPART 100 UNIT/ML ~~LOC~~ SOLN
0.0000 [IU] | Freq: Three times a day (TID) | SUBCUTANEOUS | Status: DC
  Administered 2013-12-03: 5 [IU] via SUBCUTANEOUS
  Administered 2013-12-03: 3 [IU] via SUBCUTANEOUS
  Administered 2013-12-03 – 2013-12-04 (×2): 5 [IU] via SUBCUTANEOUS

## 2013-12-03 MED ORDER — ASPIRIN 81 MG PO CHEW
81.0000 mg | CHEWABLE_TABLET | Freq: Every day | ORAL | Status: DC
  Administered 2013-12-03 – 2013-12-04 (×2): 81 mg via ORAL
  Filled 2013-12-03 (×2): qty 1

## 2013-12-03 MED ORDER — HEPARIN SODIUM (PORCINE) 5000 UNIT/ML IJ SOLN
INTRAMUSCULAR | Status: AC
  Filled 2013-12-03: qty 1

## 2013-12-03 MED ORDER — LIVING WELL WITH DIABETES BOOK
Freq: Once | Status: AC
  Administered 2013-12-03: 17:00:00
  Filled 2013-12-03: qty 1

## 2013-12-03 MED ORDER — MORPHINE SULFATE 4 MG/ML IJ SOLN
4.0000 mg | Freq: Once | INTRAMUSCULAR | Status: AC
  Administered 2013-12-03: 4 mg via INTRAVENOUS
  Filled 2013-12-03: qty 1

## 2013-12-03 MED ORDER — LORAZEPAM 1 MG PO TABS
0.0000 mg | ORAL_TABLET | Freq: Four times a day (QID) | ORAL | Status: DC
  Administered 2013-12-03: 1 mg via ORAL
  Filled 2013-12-03: qty 1

## 2013-12-03 MED ORDER — HEPARIN (PORCINE) IN NACL 2-0.9 UNIT/ML-% IJ SOLN
INTRAMUSCULAR | Status: AC
  Filled 2013-12-03: qty 1000

## 2013-12-03 MED ORDER — TICAGRELOR 90 MG PO TABS
90.0000 mg | ORAL_TABLET | Freq: Two times a day (BID) | ORAL | Status: DC
  Administered 2013-12-03 – 2013-12-04 (×3): 90 mg via ORAL
  Filled 2013-12-03 (×4): qty 1

## 2013-12-03 MED ORDER — FENOFIBRATE 160 MG PO TABS
160.0000 mg | ORAL_TABLET | Freq: Every day | ORAL | Status: DC
  Administered 2013-12-03 – 2013-12-04 (×2): 160 mg via ORAL
  Filled 2013-12-03 (×2): qty 1

## 2013-12-03 MED ORDER — FENTANYL CITRATE 0.05 MG/ML IJ SOLN
INTRAMUSCULAR | Status: AC
  Filled 2013-12-03: qty 2

## 2013-12-03 MED ORDER — FOLIC ACID 1 MG PO TABS
1.0000 mg | ORAL_TABLET | Freq: Every day | ORAL | Status: DC
  Administered 2013-12-03 – 2013-12-04 (×2): 1 mg via ORAL
  Filled 2013-12-03 (×2): qty 1

## 2013-12-03 MED ORDER — ACETAMINOPHEN 325 MG PO TABS: 650.0000 mg | ORAL_TABLET | ORAL | Status: DC | PRN

## 2013-12-03 MED ORDER — VERAPAMIL HCL 2.5 MG/ML IV SOLN
INTRAVENOUS | Status: AC
  Filled 2013-12-03: qty 2

## 2013-12-03 MED ORDER — LORAZEPAM 2 MG/ML IJ SOLN: 1.0000 mg | Freq: Four times a day (QID) | INTRAMUSCULAR | Status: DC | PRN

## 2013-12-03 MED ORDER — LORAZEPAM 1 MG PO TABS: 0.0000 mg | ORAL_TABLET | Freq: Two times a day (BID) | ORAL | Status: DC

## 2013-12-03 NOTE — ED Notes (Signed)
Dr. Ellyn Hack, cardiology, at bedside

## 2013-12-03 NOTE — Progress Notes (Signed)
Cardiac Rehab [id number] Pt still on bedrest at present. Started MI and stent education with pt and wife. Discussed MI, risk factors, restrictions, stent, Brilinita and smoking cessation. Pt and wife voices understanding. Pt states that he is finished smoking. He has quit before and stayed quit for six months. He relapsed at a party where others were smoking. I gave him tips for quitting, coaching contact number and schedule for quit smart classes. Pt is tired today and seems hard for him to concentrate. We will follow pt tomorrow. Deon Pilling, RN 12/03/2013 2:45 PM

## 2013-12-03 NOTE — Progress Notes (Signed)
Subjective: Sitting up in bed eating breakfast. Wife by bedside. Denies any further chest pain. No SOB.   Objective: Vital signs in last 24 hours: Temp:  [97.5 F (36.4 C)] 97.5 F (36.4 C) (01/02 0335) Pulse Rate:  [63-99] 86 (01/02 0730) Resp:  [12-28] 16 (01/02 0730) BP: (103-149)/(50-92) 126/78 mmHg (01/02 0730) SpO2:  [95 %-100 %] 95 % (01/02 0730) Weight:  [96 kg (211 lb 10.3 oz)-96.2 kg (212 lb 1.3 oz)] 96.2 kg (212 lb 1.3 oz) (01/02 0600) Last BM Date: 12/02/13  Intake/Output from previous day: 01/01 0701 - 01/02 0700 In: 157.2 [I.V.:157.2] Out: 550 [Urine:550] Intake/Output this shift:    Medications Current Facility-Administered Medications  Medication Dose Route Frequency Provider Last Rate Last Dose  . 0.9 %  sodium chloride infusion   Intravenous Continuous Leonie Man, MD 100 mL/hr at 12/03/13 0530    . acetaminophen (TYLENOL) tablet 650 mg  650 mg Oral Q4H PRN Inez Pilgrim, MD      . aspirin chewable tablet 324 mg  324 mg Oral NOW Inez Pilgrim, MD       Or  . aspirin suppository 300 mg  300 mg Rectal NOW Inez Pilgrim, MD      . aspirin chewable tablet 81 mg  81 mg Oral Daily Leonie Man, MD      . atorvastatin (LIPITOR) tablet 80 mg  80 mg Oral q1800 Inez Pilgrim, MD      . bivalirudin (ANGIOMAX) 5 mg/mL in sodium chloride 0.9 % 50 mL infusion  0.25 mg/kg/hr Intravenous Continuous Inez Pilgrim, MD 4.8 mL/hr at 12/03/13 0711 0.25 mg/kg/hr at 12/03/13 0711  . [START ON 12/04/2013] enoxaparin (LOVENOX) injection 40 mg  40 mg Subcutaneous Q24H Leonie Man, MD      . folic acid (FOLVITE) tablet 1 mg  1 mg Oral Daily Inez Pilgrim, MD      . insulin aspart (novoLOG) injection 0-15 Units  0-15 Units Subcutaneous TID WC Inez Pilgrim, MD   5 Units at 12/03/13 0810  . lisinopril (PRINIVIL,ZESTRIL) tablet 2.5 mg  2.5 mg Oral Daily Leonie Man, MD      . LORazepam (ATIVAN) tablet 1 mg  1 mg Oral Q6H PRN Inez Pilgrim, MD       Or  .  LORazepam (ATIVAN) injection 1 mg  1 mg Intravenous Q6H PRN Inez Pilgrim, MD      . LORazepam (ATIVAN) tablet 0-4 mg  0-4 mg Oral Q6H Inez Pilgrim, MD       Followed by  . [START ON 12/05/2013] LORazepam (ATIVAN) tablet 0-4 mg  0-4 mg Oral Q12H Inez Pilgrim, MD      . metoprolol tartrate (LOPRESSOR) tablet 12.5 mg  12.5 mg Oral BID Leonie Man, MD      . morphine 2 MG/ML injection 2 mg  2 mg Intravenous Q1H PRN Leonie Man, MD      . multivitamin with minerals tablet 1 tablet  1 tablet Oral Daily Inez Pilgrim, MD      . nitroGLYCERIN (NITROSTAT) SL tablet 0.4 mg  0.4 mg Sublingual Q5 Min x 3 PRN Inez Pilgrim, MD      . ondansetron [facility]) injection 4 mg  4 mg Intravenous Q6H PRN Inez Pilgrim, MD      . simvastatin (ZOCOR) tablet 40 mg  40 mg Oral QPM Leonie Man, MD      . thiamine (VITAMIN B-1) tablet 100 mg  100 mg Oral Daily Fenton Malling  Ulyses Amor, MD       Or  . thiamine (B-1) injection 100 mg  100 mg Intravenous Daily Inez Pilgrim, MD      . Ticagrelor [facility]) tablet 90 mg  90 mg Oral BID Leonie Man, MD        PE: General appearance: alert, cooperative and no distress Lungs: clear to auscultation bilaterally Heart: regular rate and rhythm, S1, S2 normal, no murmur, click, rub or gallop Extremities: no LEE Pulses: 2+ and symmetric Skin: warm and dry Neurologic: Grossly normal  Lab Results:   Recent Labs  12/03/13 0310 12/03/13 0312  WBC 10.4  --   HGB 14.2 13.9  HCT 39.9 41.0  PLT 239  --    BMET  Recent Labs  12/03/13 0310 12/03/13 0312  NA 135* 136*  K 4.3 3.9  CL 96 103  CO2 20  --   GLUCOSE 273* 267*  BUN 9 8  CREATININE 0.58 0.70  CALCIUM 9.9  --    PT/INR  Recent Labs  12/03/13 0310  LABPROT 10.8*  INR 0.78   Cholesterol  Recent Labs  12/03/13 0600  CHOL 218*   Lipid Panel     Component Value Date/Time   CHOL 218* 12/03/2013 0600   TRIG 1262* 12/03/2013 0600   HDL NOT REPORTED DUE TO HIGH TRIGLYCERIDES  12/03/2013 0600   CHOLHDL NOT REPORTED DUE TO HIGH TRIGLYCERIDES 12/03/2013 0600   VLDL UNABLE TO CALCULATE IF TRIGLYCERIDE OVER 400 mg/dL 12/03/2013 0600   LDLCALC UNABLE TO CALCULATE IF TRIGLYCERIDE OVER 400 mg/dL 12/03/2013 0600     Cardiac Enzymes Cardiac Panel (last 3 results) No results found for this basename: CKTOTAL, CKMB, TROPONINI, RELINDX,  in the last 72 hours  Studies/Results:  Emergent LHC 12/03/12 POST-OPERATIVE DIAGNOSIS:  Single-vessel CAD with 100% thrombotic occlusion of the dominant mid RCA.  Successful PCI with a Xience Alpine DES 3.0 mm x 18 mm, postdilated to 3.5 mm Preserved LVEF with expected mild basal inferior hypokinesis. Normal EDP.   Assessment/Plan  Active Problems:   ST elevation myocardial infarction (STEMI) of inferior wall   CAD S/P percutaneous coronary angioplasty - Promus Premier DES 3.0 mm x 18 mm (3.5 mm) to mid RCA occlusion; no significant left-sided disease.   Presence of drug coated stent in right coronary artery: Promus Premier DES 3.0 mm x 18 mm (3.5 mm) to mid RCA occlusion   Light tobacco smoker  Plan: S/p emergent LHC in setting of STEMI. 100% thrombotic occlusion of the dominant mid RCA. S/p successful  PCI + DES to RCA. Preserved LVEF with expected mild basal inferior hypokinesis. Normal EDP. EF 50-55%. No further CP. On DAPT with ASA + Brilinta. Also on ACE-I, BB and statin. I have ordered an EKG. We will need to address lifestyle modification this hospitalization. Triglycerides are severely elevated at 1262. LDL could not be calculated due to this. He will need aggressive lipid lowering therapy. Lipitor 80 ordered. Last Hgb A1c was checked 3 wks ago and was elevated at 8.1. He takes Metformin (on hold 48 hrs post cath). Pt informed that goal is < 7.0. He is motivated to quit smoking after having MI. I also discussed with him the importance of proper diet and exercise. Will put in dietician consult.  Will initiate phase I CR once cleared by MD.       LOS: 0 days    Brittainy M. Rosita Fire, PA-C 12/03/2013 8:21 AM

## 2013-12-03 NOTE — H&P (Signed)
PCP:   Elsie Stain, MD   Chief Complaint:  Chest Pain  HPI: Mrs. 53 year old Caucasian male with past medical history of hyperlipidemia, hypertension, diabetes mellitus type 2, tobacco abuse who presented with the chest pressure and burning in the substernal region 10 out of 10 in severity that started about 1:30 AM on 12/03/2012. There was associate with mild nausea, patient denied shortness of breath. Arrival to the emergency department patient was noted to have inferior ST elevation myocardial infarction and was emergently taken to the cardiac cath lab.  Patient denied any significant bleeding issues, poor the overall good compliance with his medication with the exception of recent start lisinopril.   Patient had recent URI symptoms and reports recovering from cold Patient denied active symptoms of PND orthopnea, frequent or prolonged palpitations, lower extremity edema.  Review of Systems:  12 systems were reviewed and were negative except mentioned in the history of present illness  Past Medical History: Past Medical History  Diagnosis Date  . Allergic rhinitis   . Fracture of right hand 1983  . Paresthesia     tingling in feet  . Hyperlipidemia   . Nocturia   . Hypertension   . Diabetes mellitus     type2   Past Surgical History  Procedure Laterality Date  . Septoplasty  1970    due to Fx Nose  . Knee surgery      B arthroscopic  . No past surgeries      Medications: Prior to Admission medications   Medication Sig Start Date End Date Taking? Authorizing Provider  aspirin EC 81 MG EC tablet Take 81 mg by mouth daily.     Yes Historical Provider, MD  cetirizine (ZYRTEC) 10 MG tablet Take 10 mg by mouth daily as needed.    Yes Historical Provider, MD  l-methylfolate-B6-B12 (METANX) 3-35-2 MG TABS Take 1 tablet by mouth 2 (two) times daily. [employer] states Rx no longer available in tablet form, capsules authorized. 07/20/13  Yes Tonia Ghent, MD  metFORMIN  (GLUCOPHAGE) 500 MG tablet Take 1 tablet (500 mg total) by mouth 2 (two) times daily with a meal. 07/13/13  Yes Tonia Ghent, MD  OMEGA-3 KRILL OIL 300 MG CAPS Take by mouth daily.     Yes Historical Provider, MD  simvastatin (ZOCOR) 40 MG tablet Take 1 tablet (40 mg total) by mouth every evening. 07/13/13 07/13/14 Yes Tonia Ghent, MD  lisinopril (ZESTRIL) 2.5 MG tablet Take 1 tablet (2.5 mg total) by mouth daily. 11/11/13   Tonia Ghent, MD    Allergies:   Allergies  Allergen Reactions  . Metformin And Related     Intolerant of more than 1000mg  a day    Social History:  reports that he has been smoking Cigarettes.  He has a 10 pack-year smoking history. He has never used smokeless tobacco. He reports that he drinks about 27.0 ounces of alcohol per week. He reports that he does not use illicit drugs.   Family History: Family History  Problem Relation Age of Onset  . Cancer Mother     ovarian  . Hypertension Father   . Hyperlipidemia Father   . Diabetes Father     lost 60 pounds and off all meds  . Cancer Father     bladder- Kidney cancer  . Hearing loss Brother   . Heart disease Paternal Grandfather     MI  . Colon cancer Neg Hx   . Prostate cancer Neg Hx  PHYSICAL EXAM:  Filed Vitals:   12/03/13 0315 12/03/13 0322 12/03/13 0330 12/03/13 0335  BP: 103/71 123/92 141/88   Pulse: 70 67 63   Temp:    97.5 F (36.4 C)  TempSrc:    Oral  Resp: 18 16 16    SpO2: 100% 100% 100%    General:  ill appearing, in moderate distress due to pain Well appearing. No respiratory difficulty HEENT: normal Neck: supple. JVD 3 cm above the clavicle with patient sitting upright. Carotids 2+ bilat; no bruits. No lymphadenopathy or thryomegaly appreciated. Cor: PMI nondisplaced. Regular rate & rhythm. No rubs, gallops or murmurs. Lungs: clear to auscultation Abdomen: soft, nontender, nondistended. No hepatosplenomegaly. No bruits or masses. Good bowel sounds. Extremities: no  cyanosis, clubbing, rash, edema Neuro: alert & oriented x 3, cranial nerves grossly intact. moves all 4 extremities w/o difficulty. Affect pleasant.  Labs on Admission:   Recent Labs  12/03/13 0310 12/03/13 0312  NA 135* 136*  K 4.3 3.9  CL 96 103  CO2 20  --   GLUCOSE 273* 267*  BUN 9 8  CREATININE 0.58 0.70  CALCIUM 9.9  --    No results found for this basename: AST, ALT, ALKPHOS, BILITOT, PROT, ALBUMIN,  in the last 72 hours No results found for this basename: LIPASE, AMYLASE,  in the last 72 hours  Recent Labs  12/03/13 0310 12/03/13 0312  WBC 10.4  --   NEUTROABS PENDING  --   HGB 14.2 13.9  HCT 39.9 41.0  MCV 85.3  --   PLT 239  --    No results found for this basename: CKTOTAL, CKMB, CKMBINDEX, TROPONINI,  in the last 72 hours No results found for this basename: TSH, T4TOTAL, FREET3, T3FREE, THYROIDAB,  in the last 72 hours No results found for this basename: VITAMINB12, FOLATE, FERRITIN, TIBC, IRON, RETICCTPCT,  in the last 72 hours   portable chest x-ray: Poor aspiration, heart appears to be normal given the degree of inspiration, no pleural effusions, no pneumothorax, no infiltrates, began, rated edema   EKG personally reviewed and interpreted by me: Sinus precardiac first degree AV block inferior ST elevation in leads II, III, aVF 2 mm with reciprocal depression in V1 V2 aVL  Coronary angiography: LVEDP 11: Occluded proximal RCA, no obvious abnormalities in the left coronary systems. Please see full report dictated by Dr. Ellyn Hack  Assessment/Plan Present on Admission:  . ST elevation myocardial infarction (STEMI) of inferior wall Diabetes mellitus type 2 Hyperlipidemia Tobacco abuse Alcohol abuse   Plan: inferior STEMI Aspirin 81 mg daily, ticagrelor 90 mg twice a day Lipitor 80 mg daily  Diabetes mellitus type 2 Sliding scale insulin for now Will add Lantus when necessary, hold metformin while inpatient Hemoglobin A1c  Hyperlipidemia Lipitor  80 mg daily  Tobacco abuse Tobacco cessation counseling  Alcohol abuse  alcohol withdrawal protocol initiated   [place], [place] 12/03/2013, 4:09 AM

## 2013-12-03 NOTE — H&P (Signed)
History and Physical Interval Note:  NAME:  [name] [name]   MRN: [id number] DOB:  [birth year]   ADMIT DATE: 12/03/2013   12/03/2013 3:34 AM  [name] [name] [name] [name] III is a 53 y.o. male with PMH of DM, HLD & chronic smoking who p/w acute onset SSCP @ ~0130 AM 12/03/13.  Associated with dyspnea, diaphoresis & nausea.   911 - called, but Sx waned.  Came via POV to ER -- Inferior STE ~2-3 mm noted with reciprocal cahgnes suggesting inferior (& possibly posterior) STEMI.  Code STEMI called ~0311 AM.  Currently ~5-7/10 pain.  Total of 8mg  IV Morphine given.  Past Medical History  Diagnosis Date  . Allergic rhinitis   . Fracture of right hand 1983  . Diabetes mellitus   . Paresthesia     tingling in feet  . Hyperlipidemia   . Nocturia    Past Surgical History  Procedure Laterality Date  . Septoplasty  1970    due to Fx Nose  . Knee surgery      B arthroscopic    FAMHx: Family History  Problem Relation Age of Onset  . Cancer Mother     ovarian  . Hypertension Father   . Hyperlipidemia Father   . Diabetes Father     lost 60 pounds and off all meds  . Cancer Father     bladder- Kidney cancer  . Hearing loss Brother   . Heart disease Paternal Grandfather     MI  . Colon cancer Neg Hx   . Prostate cancer Neg Hx     SOCHx:  reports that he has been smoking Cigarettes.  He has been smoking about 0.00 packs per day for the past 20 years. He has never used smokeless tobacco. He reports that he drinks alcohol. He reports that he does not use illicit drugs.  ALLERGIES: Allergies  Allergen Reactions  . Metformin And Related     Intolerant of more than 1000mg  a day    HOME MEDICATIONS:  (Not in a hospital admission)  PHYSICAL EXAM:Blood pressure 107/66, pulse 58, resp. rate 28, SpO2 97.00%. General appearance: alert, cooperative and moderate distress Neck: no adenopathy, no carotid bruit, no JVD and supple, symmetrical, trachea midline Lungs: clear to auscultation  bilaterally, normal percussion bilaterally and perhaps mild basal rales Heart: regular rate and rhythm, S1, S2 normal, no murmur, click, rub or gallop and normal apical impulse Abdomen: soft, non-tender; bowel sounds normal; no masses,  no organomegaly Extremities: extremities normal, atraumatic, no cyanosis or edema Pulses: 2+ and symmetric Neurologic: Grossly normal  IMPRESSION & PLAN The patients' history has been reviewed, patient examined, no change in status from most recent note, stable for surgery. I have reviewed the patients' chart and labs. Questions were answered to the patient's satisfaction.    [name] has presented today for surgery, with the diagnosis of INFERIOR STEMI.  The various methods of treatment have been discussed with the patient and family.   Risks / Complications include, but not limited to: Death, MI, CVA/TIA, VF/VT (with defibrillation), Bradycardia (need for temporary pacer placement), contrast induced nephropathy, bleeding / bruising / hematoma / pseudoaneurysm, vascular or coronary injury (with possible emergent CT or Vascular Surgery), adverse medication reactions, infection.     After consideration of risks, benefits and other options for treatment, the patient has consented to Procedure(s):  LEFT HEART CATHETERIZATION AND CORONARY ANGIOGRAPHY +/- AD [place]  as a surgical intervention.   Verbal  consent obtained.  We will proceed with the planned procedure.  See full H&P by Fellow on Call.  [facility] [address] [place], [place]  [address]  [phone]  12/03/2013 3:34 AM

## 2013-12-03 NOTE — ED Notes (Signed)
Cardiology at bedside.

## 2013-12-03 NOTE — ED Provider Notes (Signed)
CSN: [id number]     Arrival date & time 12/03/13  0258 History   First MD Initiated Contact with Patient 12/03/13 0310     Chief Complaint  Patient presents with  . Chest Pain  . Code STEMI    HPI Patient presents emergency department because of acute onset anterior chest pain with some radiation through to his bilateral scapula approximately an hour and a half ago.  Initially 911 was called and his pain seemed to ease up.  Family brought the patient in the emergency department either vehicle.  Patient reports severe discomfort and pain at this time.  EKG on arrival to the emergency department is consistent with acute inferior ST elevation MI.  No prior history of cardiac disease.  Patient has a history of hyperlipidemia and diabetes.  He's had a recent upper respiratory symptoms and influenza-like illness over the past several days.  He's had intermittent chest discomfort described as "heartburn" throughout the week.  Tonights episode was much more severe.   Past Medical History  Diagnosis Date  . Allergic rhinitis   . Fracture of right hand 1983  . Diabetes mellitus   . Paresthesia     tingling in feet  . Hyperlipidemia   . Nocturia    Past Surgical History  Procedure Laterality Date  . Septoplasty  1970    due to Fx Nose  . Knee surgery      B arthroscopic   Family History  Problem Relation Age of Onset  . Cancer Mother     ovarian  . Hypertension Father   . Hyperlipidemia Father   . Diabetes Father     lost 60 pounds and off all meds  . Cancer Father     bladder- Kidney cancer  . Hearing loss Brother   . Heart disease Paternal Grandfather     MI  . Colon cancer Neg Hx   . Prostate cancer Neg Hx    History  Substance Use Topics  . Smoking status: Current Some Day Smoker -- 20 years    Types: Cigarettes  . Smokeless tobacco: Never Used     Comment: smoke a pack a week  . Alcohol Use: Yes     Comment: beer once a week, weekends    Review of Systems  All other  systems reviewed and are negative.    Allergies  Metformin and related  Home Medications   Current Outpatient Rx  Name  Route  Sig  Dispense  Refill  . aspirin EC 81 MG EC tablet   Oral   Take 81 mg by mouth daily.           . cetirizine (ZYRTEC) 10 MG tablet   Oral   Take 10 mg by mouth daily as needed.          Marland Kitchen l-methylfolate-B6-B12 (METANX) 3-35-2 MG TABS   Oral   Take 1 tablet by mouth 2 (two) times daily. [employer] states Rx no longer available in tablet form, capsules authorized.         Marland Kitchen lisinopril (ZESTRIL) 2.5 MG tablet   Oral   Take 1 tablet (2.5 mg total) by mouth daily.   90 tablet   3   . metFORMIN (GLUCOPHAGE) 500 MG tablet   Oral   Take 1 tablet (500 mg total) by mouth 2 (two) times daily with a meal.   180 tablet   1   . OMEGA-3 KRILL OIL 300 MG CAPS   Oral  Take by mouth daily.           . simvastatin (ZOCOR) 40 MG tablet   Oral   Take 1 tablet (40 mg total) by mouth every evening.   90 tablet   1    BP 107/66  Pulse 58  Resp 28  SpO2 97% Physical Exam  Nursing note and vitals reviewed. Constitutional: He is oriented to person, place, and time. He appears well-developed and well-nourished.  Pale  HENT:  Head: Normocephalic and atraumatic.  Eyes: EOM are normal.  Neck: Normal range of motion.  Cardiovascular: Normal rate, regular rhythm, normal heart sounds and intact distal pulses.   Pulmonary/Chest: Effort normal and breath sounds normal. No respiratory distress.  Abdominal: Soft. He exhibits no distension. There is no tenderness.  Musculoskeletal: Normal range of motion.  Neurological: He is alert and oriented to person, place, and time.  Skin: Skin is warm and dry.  Psychiatric: He has a normal mood and affect. Judgment normal.    ED Course  Procedures (including critical care time)  CRITICAL CARE Performed by: Hoy Morn Total critical care time: 30 Critical care time was exclusive of separately billable  procedures and treating other patients. Critical care was necessary to treat or prevent imminent or life-threatening deterioration. Critical care was time spent personally by me on the following activities: development of treatment plan with patient and/or surrogate as well as nursing, discussions with consultants, evaluation of patient's response to treatment, examination of patient, obtaining history from patient or surrogate, ordering and performing treatments and interventions, ordering and review of laboratory studies, ordering and review of radiographic studies, pulse oximetry and re-evaluation of patient's condition.   Labs Review Labs Reviewed  POCT I-STAT, CHEM 8 - Abnormal; Notable for the following:    Sodium 136 (*)    Glucose, Bld 267 (*)    All other components within normal limits  CBC  DIFFERENTIAL  PROTIME-INR  APTT  BASIC METABOLIC PANEL   Imaging Review No results found.  EKG Interpretation    Date/Time:  Friday December 03 2013 02:59:05 EST Ventricular Rate:  57 PR Interval:  276 QRS Duration: 108 QT Interval:  370 QTC Calculation: 360 R Axis:   84 Text Interpretation:  Sinus bradycardia with 1st degree A-V block ST elevation consider inferior injury or acute infarct ACUTE MI Abnormal ECG changed from prior ecg Confirmed by Virdie Penning  MD, Lizabeth Fellner ([id number]) on 12/03/2013 3:16:37 AM            MDM   1. STEMI (ST elevation myocardial infarction)    Spoke with Dr. Ellyn Hack, it original cardiologist who will mobilize the cardiac catheter team and plan for emergent heart catheterization.  Aspirin given.  4000 unit heparin bolus.  Morphine for pain.  Patient still having discomfort at this time.  Zoll monitors in place    Hoy Morn, MD 12/03/13 [phone]

## 2013-12-03 NOTE — Care Management Note (Signed)
    Page 1 of 1   12/03/2013     9:32:07 AM   CARE MANAGEMENT NOTE 12/03/2013  Patient:  [name] [name], [name] [name]   Account Number:  [id number]  Date Initiated:  12/03/2013  Documentation initiated by:  Elissa Hefty  Subjective/Objective Assessment:   adm w mi     Action/Plan:   lives w wife, pcp dr Elsie Stain   Anticipated DC Date:     Anticipated DC Plan:        DC Planning Services  CM consult  Medication Assistance      Choice offered to / List presented to:             Status of service:   Medicare Important Message given?   (If response is "NO", the following Medicare IM given date fields will be blank) Date Medicare IM given:   Date Additional Medicare IM given:    Discharge Disposition:    Per UR Regulation:  Reviewed for med. necessity/level of care/duration of stay  If discussed at [place] of Stay Meetings, dates discussed:    Comments:  1/2 0930 debbie Caly Pellum rn,bsn has 45.00 per month copay w no prior auth for brilinta. will give pt 30day free and copay assist card.

## 2013-12-03 NOTE — Progress Notes (Signed)
  Echocardiogram 2D Echocardiogram has been performed.  Chenoa Luddy FRANCES 12/03/2013, 10:53 AM

## 2013-12-03 NOTE — Progress Notes (Signed)
Pt. Seen and examined. Agree with the NP/PA-C note as written. Feeling much better today after acute intervention for mid-RCA thrombotic occlusion last night. He has marked dyslipidemia with elevated triglycerides. Blood glucose is very elevated. Check A1c in am tomorrow. Agree with lipitor 80 mg daily, rather than simvastatin. Will add fenofibrate 160 mg daily for triglycerides. Trend troponin for peak to gauge size of infarct. Plan for 2D echo today. Can work with cardiac rehab for Phase 1 today. Anticipate possible d/c tomorrow.  Can transfer to stepdown status.  Pixie Casino, MD, [facility] Attending Cardiologist [place]

## 2013-12-03 NOTE — ED Notes (Addendum)
Patient presents via POV with c/o chest pain. Reports that he has had it intermittently over the past couple of days. Initially thought it was heart burn. Tonight, he was awoken with this pain around 11:30. Described as a pressure and burning sensation. Rates 10/10. Chest pain is substernal and radiates to bilateral arms and left shoulder. Also has diaphoresis, some mild shortness of breath, anxiety and is pale in color.

## 2013-12-03 NOTE — ED Notes (Signed)
To Cath lab at this time 

## 2013-12-03 NOTE — Progress Notes (Signed)
Inpatient Diabetes Program Recommendations  AACE/ADA: New Consensus Statement on Inpatient Glycemic Control (2013)  Target Ranges:  Prepandial:   less than 140 mg/dL      Peak postprandial:   less than 180 mg/dL (1-2 hours)      Critically ill patients:  140 - 180 mg/dL   Reason for Visit: Uncontrolled DM  Pt states he has been "off the wagon" with his diabetes care.  Checked blood sugars in the past but hasn't done this recently.  Sees Dr. Damita Dunnings for diabetes management.  States he has been under a lot of stress in the past few months with the death of his mother and father had stroke.  Pt and wife seem motivated to make lifestyle changes with diet and exercise.  Stressed importance of glucose monitoring and f/u with PCP. Discussed HgbA1C results and answered questions.  Results for [name] [name], [name] [name] (MRN [id number]) as of 12/03/2013 16:01  Ref. Range 12/03/2013 08:03 12/03/2013 12:45  Glucose-Capillary Latest Range: 70-99 mg/dL 227 (H) 187 (H)  Results for [name], [name] (MRN [id number]) as of 12/03/2013 16:01  Ref. Range 12/03/2013 03:10 12/03/2013 03:12  Sodium Latest Range: 137-147 mEq/L 135 (L) 136 (L)  Potassium Latest Range: 3.7-5.3 mEq/L 4.3 3.9  Chloride Latest Range: 96-112 mEq/L 96 103  CO2 Latest Range: 19-32 mEq/L 20   BUN Latest Range: 6-23 mg/dL 9 8  Creatinine Latest Range: 0.50-1.35 mg/dL 0.58 0.70  Calcium Latest Range: 8.4-10.5 mg/dL 9.9   GFR calc non Af Amer Latest Range: >90 mL/min >90   GFR calc Af Amer Latest Range: >90 mL/min >90   Glucose Latest Range: 70-99 mg/dL 273 (H) 267 (H)      Results for [name], [name] (MRN [id number]) as of 12/03/2013 16:01  Ref. Range 12/03/2013 06:00  Hemoglobin A1C Latest Range: <5.7 % 8.1 (H)   Recommendations: Add HS Novolog correction. At discharge, would recommend restarting metformin 500 mg bid.  F/U with PCP within 1 month for diabetes management. Encouraged pt and wife to view diabetes videos on pt ed  channel. Will order OP Diabetes Education consult for elevated HgbA1C.  Thank you. Lorenda Peck, RD, LDN, CDE Inpatient Diabetes Coordinator [phone]

## 2013-12-03 NOTE — ED Notes (Signed)
MD at bedside. Dr. Ellyn Hack at bedside

## 2013-12-03 NOTE — CV Procedure (Signed)
CARDIAC CATHETERIZATION AND PERCUTANEOUS CORONARY INTERVENTION REPORT  NAME:  [name] [name]   MRN: [id number] DOB:  Aug 19, 1961   ADMIT DATE: 12/03/2013 Procedure Date: 12/03/2013  INTERVENTIONAL CARDIOLOGIST: Leonie Man, M.D., MS PRIMARY CARE PROVIDER: Elsie Stain, MD PRIMARY CARDIOLOGIST: Leonie Man, MD, MS  PATIENT:  [name] [name] is a 53 y.o. male with PMH of HLD, HTN, DM-2 & tobacco abuse who presented emergency room 0130 hours on 12/03/2012. He said the pain initially wore off after several minutes, but then it recurred a roughly an hour later when he again was awakened by severe to 10 pain that did not go away. Associated with nausea and sweating. Denies any significant dyspnea but did have some upon arrival to the ER. He came to the emergency room via POV, and upon arrival was noted to have roughly 2-3 mm Inferior ST elevations with a suggestion of possible posterior infarction as well .  Due to concern of possible posterior MI, he was given nitroglycerin, was given to 8 mg of morphine which produces pain down to about 3/10. He remains somewhat diaphoretic and nauseated. He read the cardiac catheterization lab at 0351 hours with roughly 2/10 chest pain but persistent ST elevations.  PRE-OPERATIVE DIAGNOSIS:    Inferior (possible Posterior) STEMI  PROCEDURES PERFORMED:    LEFT HEART CATHETERIZATION WITH CORONARY ANGIOGRAPHY  PERCUTANEOUS CORONARY INTERVENTION of 100% Thrombotic Lesion Occluded mid RCA: Xience Alpine DES 3.0 mm x 18 mm (3.5 mm)  PROCEDURE:Consent:  Risks of procedure as well as the alternatives and risks of each were explained to the (patient/caregiver).  Consent for procedure obtained. Consent for signed by MD and patient with RN witness -- placed on chart.   PROCEDURE: The patient was brought to the 2nd [facility] in the fasting state and prepped and draped in the usual sterile fashion for Right groin or  radial access. A modified Allen's test with plethysmography was performed, revealing excellent Ulnar artery collateral flow.  Sterile technique was used including antiseptics, cap, gloves, gown, hand hygiene, mask and sheet.  Skin prep: Chlorhexidine.  Time Out: Verified patient identification, verified procedure, site/side was marked, verified correct patient position, special equipment/implants available, medications/allergies/relevent history reviewed, required imaging and test results available.  Performed  Access: RIGHT RADIAL Artery; 6 French Fr Sheath -- Seldinger technique (Angiocath Micropuncture Kit)  IA Radial Cocktail, IV Angiomax Diagnostic: 5 Fr TIG 4.0, 6 Fr JR 4 Guide Catheter, Angled Pigtail post PCI -- catheters advanced and exchanged into the ascending aorta over long exchange safety J-wire.  Right & Left Coronary Artery Angiography: TIG 4.0  PCI: 6 [place] JR 4 guide  LV Hemodynamics (LV Gram): 5 French Angled pigtail  TR Band:  0447 Hours, 12 mL air  MEDICATIONS:  Anesthesia:  Local Lidocaine 2 ml  Sedation:  2 mg IV Versed, 50 mcg IV fentanyl ;   Premedication: 4 mg morphine x2, 4000 units of Heparin IV; Aspirin 324 mg  Omnipaque Contrast: 155 ml Radial Cocktail: 5 mg Verapamil, 400 mcg NTG, 2 ml 2% Lidocaine in 10 ml NS  Anticoagulation:  Angiomax Bolus & drip  Anti-Platelet Agent:  Brilinta 180 mg  Normal Saline 250 mg bolus x2  Hemodynamics:  Central Aortic / Mean Pressures: 127/80 mmHg; 106 mmHg  Left Ventricular Pressures / EDP: 124/8 mmHg; 11 mmHg  Left Ventriculography:  EF: 50-55 %  Wall Motion: Basal to mid inferior hypokinesis  Coronary Anatomy: Overall tortuous vessels, with single-vessel disease.  Left Main: Very large caliber, long vessel with a downward takeoff. This branches into 4 branches including the LAD, dual Ramus Intermedius branches and the Circumflex. LAD: Large-caliber vessel that wraps the apex. There small diagonal  branches and several septal perforators. Minimal luminal irregularities noted.  Left Circumflex: Large caliber, nondominant vessel that courses as a Lateral OM with a diminutive AV groove circumflex. Angiographically normal.  Ramus intermedius:   D1: Large-caliber vessel the courses of the first diagonal branch along the anterolateral wall that reaches the apex. Minimal luminal irregularities.  OM1: Large-caliber vessel that courses as a first obtuse marginal branch along the lateral wall toward the apex  RCA: Large caliber, dominant vessel that is occluded in the midportion; 100% thrombotic.  After wire insertion this revealed a subtotally occluded mid segment with a 70% tandem lesion just before the mid vessel bend. The RCA then courses are relatively normal vessel with 2 RV marginal branches before bifurcating distally into the Right Posterior Descending Artery (RPDA) in the Right Posterior AV Groove Branch (RPAV). Beyond the occlusion and 70% stenoses, there is minimal luminal irregularities.  RPDA: Moderate large-caliber vessel that reaches almost to the apex. This is tortuous but with minimal luminal irregularities.  RPL Sysytem:The RPAV begins a moderate large-caliber vessel that gives off a major, or caliber RPL 1 with a smaller RPL 2 and more significant smaller moderate RPL 3. Minimal luminal irregularities.  Angiographically it clearly indicated the culprit lesion being mid RCA occlusion. Preparations were then made for PCI on this occlusion. The patient was given 180 mg of Brilinta, and Angiomax given at the time of sheath placement as a bolus was continued as a drip.  Percutaneous Coronary Intervention:  Sheath exchanged for 6 Fr Guide: 6 Fr   JR 4  Guidewire: BMW  Predilation Balloon: Trek 2.5  mm x 12  mm;   8  Atm x 20  Sec, 3 inflations   Stent: Xience Alpine 3.0  mm x 18  mm;   Deployed at: 14  Atm x 30 Sec,   Post dilation with stent balloon: 18  Atm x 30  Sec  Post-dilation Balloon: [place] Trek 3.5  mm x 12  mm;   12  Atm x 45  Sec: Distal diameter: 3.48 mm  14  Atm x 45  Sec: Proximal diameter: 3.54 mm  Final Diameter: ~3.5 mm  Post deployment angiography in multiple views, with and without guidewire in place revealed excellent stent deployment and lesion coverage.  There was no evidence of dissection or perforation.  PATIENT DISPOSITION:    The patient was transferred to the PACU holding area in a hemodynamicaly stable, chest pain free condition.  The patient tolerated the procedure well, and there were no complications.  EBL:   < 10  ml  The patient was stable before, during, and after the procedure.  POST-OPERATIVE DIAGNOSIS:    Single-vessel CAD with 100% thrombotic occlusion of the dominant mid RCA.  Successful PCI with a Xience Alpine DES 3.0 mm x 18 mm, postdilated to 3.5 mm  Preserved LVEF with expected mild basal inferior hypokinesis. Normal EDP.  PLAN OF CARE:  Admit to CCU On Fast-Track plan  Standard post radial cath care.  Dual Antiplatelet Therapy for a minimum of 1 year.  Continue home and ACE inhibitor and statin.  I have started a low-dose beta blocker.   Leonie Man, M.D., M.S. [facility] [address]. [place], [place]  [address]  [phone]  12/03/2013 5:04 AM

## 2013-12-03 NOTE — ED Notes (Signed)
Heparin 4000 units IVP administered by E. White, Therapist, sports

## 2013-12-03 NOTE — ED Notes (Signed)
Portable CXR at bedside.

## 2013-12-03 NOTE — Plan of Care (Signed)
Problem: Limited Adherence to Nutrition-Related Recommendations (NB-1.6) Goal: Nutrition education Formal process to instruct or train a patient/client in a skill or to impart knowledge to help patients/clients voluntarily manage or modify food choices and eating behavior to maintain or improve health. Outcome: Completed/Met Date Met:  12/03/13  RD consulted for nutrition education regarding diabetes & high cholesterol.    Lab Results  Component Value Date    HGBA1C 8.1* 11/08/2013    Lipid Panel     Component Value Date/Time    CHOL 218* 12/03/2013 0600    TRIG 1262* 12/03/2013 0600    HDL NOT REPORTED DUE TO HIGH TRIGLYCERIDES 12/03/2013 0600    CHOLHDL NOT REPORTED DUE TO HIGH TRIGLYCERIDES 12/03/2013 0600    VLDL UNABLE TO CALCULATE IF TRIGLYCERIDE OVER 400 mg/dL 12/03/2013 0600    LDLCALC UNABLE TO CALCULATE IF TRIGLYCERIDE OVER 400 mg/dL 12/03/2013 0600     RD provided "Carbohydrate Counting for People with Diabetes" & "High Cholesterol Nutrition Therapy" handouts from the Academy of Nutrition and Dietetics.  Per diet recall, patient mostly drinks water and also mixes regular tea with sweet tea.  RD recommended unsweet tea with artifical sweetener (ie Sweet N' Low, Splenda); provided other diet/sugar-free beverages options.  Emphasized carbohydrate portion control at a sitting and grilling/baking meats.  Patient states his weakness is french fries and fast food.  RD encouraged patient to choose "healthier" options when eating at fast food establishments.  Expect fair compliance.  Body mass index is 29.99 kg/(m^2). Pt meets criteria for Overweight based on current BMI.  Current diet order is Heart Healthy, patient is consuming approximately 100% of meals at this time. Labs and medications reviewed. No further nutrition interventions warranted at this time. If additional nutrition issues arise, please re-consult RD.  Arthur Holms, RD, LDN Pager #: [phone] After-Hours Pager #:  [phone]

## 2013-12-03 NOTE — Progress Notes (Signed)
Chaplain paged to code stemi. Staff working on patient. Patient's wife at bedside. Patient taken to cath lab.   12/03/13 0320  Clinical Encounter Type  Visited With Health care provider  Visit Type Initial;Code;ED  Referral From Nurse

## 2013-12-03 NOTE — ED Notes (Signed)
EKG done in triage and indicative of a STEMI. Immediately shown to Dr. Venora Maples who confirms this diagnoses. CODE STEMI called.

## 2013-12-03 NOTE — ED Notes (Addendum)
Aspirin 324 mg PO chewable administered by Meryl Crutch, RN

## 2013-12-03 NOTE — ED Notes (Signed)
Morphine 4 mg IVP administered by E. White, Therapist, sports

## 2013-12-04 LAB — BASIC METABOLIC PANEL
BUN: 9 mg/dL (ref 6–23)
CALCIUM: 8.7 mg/dL (ref 8.4–10.5)
CO2: 24 meq/L (ref 19–32)
Chloride: 97 mEq/L (ref 96–112)
Creatinine, Ser: 0.6 mg/dL (ref 0.50–1.35)
GFR calc Af Amer: >90 mL/min (ref >90)
GFR calc non Af Amer: >90 mL/min (ref >90)
Glucose, Bld: 200 mg/dL — ABNORMAL HIGH (ref 70–99)
Potassium: 4.3 mEq/L (ref 3.7–5.3)
SODIUM: 136 meq/L — AB (ref 137–147)

## 2013-12-04 LAB — CBC
HCT: 37.5 % — ABNORMAL LOW (ref 39.0–52.0)
Hemoglobin: 13.1 g/dL (ref 13.0–17.0)
MCH: 30.2 pg (ref 26.0–34.0)
MCHC: 34.9 g/dL (ref 30.0–36.0)
MCV: 86.4 fL (ref 78.0–100.0)
Platelets: 194 10*3/uL (ref 150–400)
RBC: 4.34 MIL/uL (ref 4.22–5.81)
RDW: 12.7 % (ref 11.5–15.5)
WBC: 9.1 10*3/uL (ref 4.0–10.5)

## 2013-12-04 LAB — GLUCOSE, CAPILLARY: GLUCOSE-CAPILLARY: 214 mg/dL — AB (ref 70–99)

## 2013-12-04 MED ORDER — DM-GUAIFENESIN ER 30-600 MG PO TB12: 1.0000 | ORAL_TABLET | Freq: Two times a day (BID) | ORAL | Status: DC

## 2013-12-04 MED ORDER — NITROGLYCERIN 0.4 MG SL SUBL: 0.4000 mg | SUBLINGUAL_TABLET | SUBLINGUAL | Status: DC | PRN

## 2013-12-04 MED ORDER — ATORVASTATIN CALCIUM 80 MG PO TABS: 80.0000 mg | ORAL_TABLET | Freq: Every day | ORAL | Status: DC

## 2013-12-04 MED ORDER — METOPROLOL TARTRATE 25 MG PO TABS: 12.5000 mg | ORAL_TABLET | Freq: Two times a day (BID) | ORAL | Status: DC

## 2013-12-04 MED ORDER — FENOFIBRATE 160 MG PO TABS: 160.0000 mg | ORAL_TABLET | Freq: Every day | ORAL | Status: DC

## 2013-12-04 MED ORDER — FOLIC ACID 1 MG PO TABS: 1.0000 mg | ORAL_TABLET | Freq: Every day | ORAL | Status: DC

## 2013-12-04 MED ORDER — TICAGRELOR 90 MG PO TABS: 90.0000 mg | ORAL_TABLET | Freq: Two times a day (BID) | ORAL | Status: DC

## 2013-12-04 MED ORDER — DM-GUAIFENESIN ER 30-600 MG PO TB12
1.0000 | ORAL_TABLET | Freq: Two times a day (BID) | ORAL | Status: DC
  Administered 2013-12-04: 1 via ORAL
  Filled 2013-12-04 (×2): qty 1

## 2013-12-04 MED ORDER — ADULT MULTIVITAMIN W/MINERALS CH: 1.0000 | ORAL_TABLET | Freq: Every day | ORAL | Status: DC

## 2013-12-04 NOTE — Progress Notes (Signed)
CARDIAC REHAB PHASE I   PRE:  Rate/Rhythm: 76 SR    BP: sitting 131/75    SaO2:   MODE:  Ambulation: 350 ft   POST:  Rate/Rhythm: 90 SR    BP: sitting 129/78     SaO2:   Tolerated well. No c/o. Ed completed with good reception. Wants to make change. Interested in [place] and will send referral to [facility]. [phone]   Josephina Shih [place] CES, ACSM 12/04/2013 11:48 AM

## 2013-12-04 NOTE — Discharge Instructions (Signed)
No work until you see Dr. Ellyn Hack.  Heart Healthy, low salt, diabetic diet.   No lifting over 5 pounds, no driving until you see Dr. Ellyn Hack.  Take 1 NTG, under your tongue, while sitting.  If no relief of pain may repeat NTG, one tab every 5 minutes up to 3 tablets total over 15 minutes.  If no relief CALL 911.  If you have dizziness/lightheadness  while taking NTG, stop taking and call 911.        Call [facility] at [phone] if any bleeding, swelling or drainage at cath site.  May shower, no tub baths for 48 hours for groin sticks.   Weigh daily call if increasing weight.  DO NOT TAKE METFORMIN UNTIL Monday 12/06/12--it may interact with cath dye.  Do Not Smoke  Excuse from Work, [employer], or Physical Activity [name]  DOB [birth year]_____________________________________________ needs to be excused from: __XX___ Work _____ [employer] _____ Physical activity Beginning now and through the following date: until seen in 90 week by Dr. Ellyn Hack-  He has been hospitalized and must recover.   ____________________ _____ He/she may return to work or school but still avoid physical activity from now until: ____________________ _____ He/she may return to full physical activity as of: ____________________ Caregiver's signature: Cecilie Kicks, [place], [place], [place]  [place], [place]   [phone]_______________________________________  Date: _1/3/14_____________________________________________________ Document Released: 05/14/2001 Document Revised: 02/10/2012 Document Reviewed: 11/18/2005 Exit[employer] Patient Information 2014 [place], [place].

## 2013-12-04 NOTE — Discharge Summary (Signed)
Physician Discharge Summary       Patient ID: [name] [name] MRN: [id number] DOB/AGE: February 05, 1961 53 y.o.  Admit date: 12/03/2013 Discharge date: 12/04/2013  Discharge Diagnoses:  Principal Problem:   STEMI (ST elevation myocardial infarction) of inf. wall Active Problems:   CAD S/P percutaneous coronary angioplasty - Promus Premier DES 3.0 mm x 18 mm (3.5 mm) to mid RCA occlusion; no significant left-sided disease.   HYPERLIPIDEMIA, MIXED   Diabetes mellitus type 2 with neurological manifestations   Presence of drug coated stent in right coronary artery: Promus Premier DES 3.0 mm x 18 mm (3.5 mm) to mid RCA occlusion 12/03/12   Light tobacco smoker   Discharged Condition: good  Procedures: Emergent cardiac cath 12/03/13 for STEMI by Dr. Ellyn Hack 12/03/13 Successful PCI with a Xience Alpine DES 3.0 mm x 18 mm, postdilated to 3.5 mm   Hospital Course:  53 year old Caucasian male with past medical history of hyperlipidemia, hypertension, diabetes mellitus type 2, tobacco abuse who presented with the chest pressure and burning in the substernal region 10 out of 10 in severity that started about 1:30 AM on 12/03/2013. There was associated mild nausea, patient denied shortness of breath. Arrival to the emergency department patient was noted to have inferior ST elevation myocardial infarction and was emergently taken to the cardiac cath lab.  Patient denied any significant bleeding issues, overall good compliance with his medication with the exception of recent start lisinopril.   Patient had recent URI symptoms and reports recovering from cold  Patient denied active symptoms of PND orthopnea, frequent or prolonged palpitations, lower extremity edema.   Pt did well post procedure and admitted to CCU.  Troponin pked at >20.  EF by echo 45-50%.  Pt remained stable.  By 12/04/13 he ambulated with cardiac rehab without problems.  Was seen and evaluated by Dr. Debara Pickett and found stable for discharge.    He will follow up in 1 week with Dr. Ellyn Hack.  Diabetic coordinator saw pt during his stay and encouraged lifestyle changes to improve diabetes management.  His metformin is on hold for possible interaction with cath dye and he will resume on Monday.  He will follow up with PCP for further management of the diabetes.  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   Consults: None  Significant Diagnostic Studies:  BMET    Component Value Date/Time   NA 136* 12/04/2013 0520   K 4.3 12/04/2013 0520   CL 97 12/04/2013 0520   CO2 24 12/04/2013 0520   GLUCOSE 200* 12/04/2013 0520   BUN 9 12/04/2013 0520   CREATININE 0.60 12/04/2013 0520   CALCIUM 8.7 12/04/2013 0520   GFRNONAA >90 12/04/2013 0520   GFRAA >90 12/04/2013 0520    CBC    Component Value Date/Time   WBC 9.1 12/04/2013 0520   RBC 4.34 12/04/2013 0520   HGB 13.1 12/04/2013 0520   HCT 37.5* 12/04/2013 0520   PLT 194 12/04/2013 0520   MCV 86.4 12/04/2013 0520   MCH 30.2 12/04/2013 0520   MCHC 34.9 12/04/2013 0520   RDW 12.7 12/04/2013 0520   LYMPHSABS 5.2* 12/03/2013 0310   MONOABS 0.8 12/03/2013 0310   EOSABS 0.2 12/03/2013 0310   BASOSABS 0.0 12/03/2013 0310    Troponin >20 HgBA1C 8.1 TSH 1.337  CXR PORTABLE CHEST - 1 VIEW COMPARISON: None. FINDINGS: Shallow inspiration. Cardiac enlargement without vascular congestion. Infiltration or atelectasis suggested in the left  mid lung. No blunting of costophrenic angles. No pneumothorax.  IMPRESSION: Shallow inspiration with infiltration or atelectasis in the left mid lung. Cardiac enlargement.   2D Echo:   Left ventricle: The cavity size was normal. There was mild concentric hypertrophy. Systolic function was mildly reduced. The estimated ejection fraction was in the range of 45% to 50%. Mild inferior hypokinesis. Doppler parameters are consistent with abnormal left ventricular relaxation (grade 1 diastolic dysfunction). The E/e' ratio is  ~10, suggesting borderline elevated LV filling pressure. - Left atrium: The atrium was normal in size. - Right atrium: The atrium was mildly dilated. - Atrial septum: No defect or patent foramen ovale was identified. - Inferior vena cava: The vessel was normal in size; the respirophasic diameter changes were in the normal range (= 50%); findings are consistent with normal central venous pressure. - Pericardium, extracardiac: There was no pericardial effusion.    Discharge Exam: Blood pressure 124/78, pulse 74, temperature 98.4 F (36.9 C), temperature source Oral, resp. rate 16, height 5' 10.5" (1.791 m), weight 211 lb 10.3 oz (96 kg), SpO2 96.00%.  AM exam Dr. Debara Pickett: Physical Exam:  General appearance: alert and no distress  Lungs: clear to auscultation bilaterally  Heart: regular rate and rhythm, S1, S2 normal, no murmur, click, rub or gallop  Extremities: extremities normal, atraumatic, no cyanosis or edema and intact right radial pulse, no hematoma at cath site     Disposition: 01-Home or Self Care      Discharge Orders   Future Appointments Provider Department Dept Phone   02/08/2014 8:15 AM [facility] [place] [facility] [place] [phone]   02/10/2014 8:15 AM Tonia Ghent, MD [place] [facility] [phone]   Future Orders Complete By Expires   Amb Referral to Cardiac Rehabilitation  As directed    Ambulatory referral to Nutrition and Diabetic Education  As directed        Medication List    STOP taking these medications       simvastatin 40 MG tablet  Commonly known as:  ZOCOR      TAKE these medications       aspirin EC 81 MG tablet  Take 81 mg by mouth daily.     atorvastatin 80 MG tablet  Commonly known as:  LIPITOR  Take 1 tablet (80 mg total) by mouth daily at 6 PM.     cetirizine 10 MG tablet  Commonly known as:  ZYRTEC  Take 10 mg by mouth daily as needed.     dextromethorphan-guaiFENesin 30-600 MG per 12 hr  tablet  Commonly known as:  MUCINEX DM  Take 1 tablet by mouth 2 (two) times daily.     fenofibrate 160 MG tablet  Take 1 tablet (160 mg total) by mouth daily.     l-methylfolate-B6-B12 3-35-2 MG Tabs  Commonly known as:  METANX  Take 1 tablet by mouth 2 (two) times daily. [employer] states Rx no longer available in tablet form, capsules authorized.     lisinopril 2.5 MG tablet  Commonly known as:  ZESTRIL  Take 1 tablet (2.5 mg total) by mouth daily.     metFORMIN 500 MG tablet  Commonly known as:  GLUCOPHAGE  Take 1 tablet (500 mg total) by mouth 2 (two) times daily with a meal.     metoprolol tartrate 25 MG tablet  Commonly known as:  LOPRESSOR  Take 0.5 tablets (12.5 mg total) by mouth 2 (two) times daily.     multivitamin  with minerals Tabs tablet  Take 1 tablet by mouth daily.     nitroGLYCERIN 0.4 MG SL tablet  Commonly known as:  NITROSTAT  Place 1 tablet (0.4 mg total) under the tongue every 5 (five) minutes x 3 doses as needed for chest pain.     Omega-3 Krill Oil 300 MG Caps  Take by mouth daily.     Ticagrelor 90 MG Tabs tablet  Commonly known as:  BRILINTA  Take 1 tablet (90 mg total) by mouth 2 (two) times daily.       Follow-up Information   Follow up with HARDING,DAVID W, MD. (in 1 week our office will call with date and time)    Specialty:  Cardiology   Contact information:   [place] [address] [phone]        Discharge Instructions: No work until you see Dr. Ellyn Hack.  Heart Healthy, low salt, diabetic diet.   No lifting over 5 pounds, no driving until you see Dr. Ellyn Hack.  Take 1 NTG, under your tongue, while sitting.  If no relief of pain may repeat NTG, one tab every 5 minutes up to 3 tablets total over 15 minutes.  If no relief CALL 911.  If you have dizziness/lightheadness  while taking NTG, stop taking and call 911.        Call [facility] at [phone] if any bleeding, swelling or  drainage at cath site.  May shower, no tub baths for 48 hours for groin sticks.   Weigh daily call if increasing weight.  DO NOT TAKE METFORMIN UNTIL Monday 12/06/12--it may interact with cath dye.  Do Not Smoke    Signed: [place] Nurse Practitioner-Certified [facility]: [facility] 12/04/2013, 2:14 PM  Time spent on discharge :40 minutes.

## 2013-12-04 NOTE — Progress Notes (Signed)
DAILY PROGRESS NOTE  Subjective:  No events tonight. Feeling better today. Some cough.  Objective:  Temp:  [98.2 F (36.8 C)-98.7 F (37.1 C)] 98.2 F (36.8 C) (01/03 0400) Pulse Rate:  [63-85] 74 (01/02 2250) Resp:  [13-22] 22 (01/02 2000) BP: (109-141)/(72-87) 109/74 mmHg (01/03 0400) SpO2:  [91 %-97 %] 92 % (01/03 0400) Weight:  [211 lb 10.3 oz (96 kg)] 211 lb 10.3 oz (96 kg) (01/03 0500) Weight change: 0 lb (0 kg)  Intake/Output from previous day: 01/02 0701 - 01/03 0700 In: 1684 [P.O.:1260; I.V.:424] Out: 2050 [Urine:2050]  Intake/Output from this shift:    Medications: Current Facility-Administered Medications  Medication Dose Route Frequency Provider Last Rate Last Dose  . acetaminophen (TYLENOL) tablet 650 mg  650 mg Oral Q4H PRN Inez Pilgrim, MD      . alum & mag hydroxide-simeth (MAALOX/MYLANTA) 200-200-20 MG/5ML suspension 30 mL  30 mL Oral Q4H PRN Leonie Man, MD   30 mL at 12/03/13 1830  . aspirin chewable tablet 81 mg  81 mg Oral Daily Leonie Man, MD   81 mg at 12/03/13 1020  . atorvastatin (LIPITOR) tablet 80 mg  80 mg Oral q1800 Inez Pilgrim, MD   80 mg at 12/03/13 1810  . enoxaparin (LOVENOX) injection 40 mg  40 mg Subcutaneous Q24H Leonie Man, MD      . fenofibrate tablet 160 mg  160 mg Oral Daily Pixie Casino, MD   160 mg at 12/03/13 1058  . folic acid (FOLVITE) tablet 1 mg  1 mg Oral Daily Inez Pilgrim, MD   1 mg at 12/03/13 1057  . insulin aspart (novoLOG) injection 0-15 Units  0-15 Units Subcutaneous TID WC Inez Pilgrim, MD   5 Units at 12/03/13 1808  . insulin aspart (novoLOG) injection 0-5 Units  0-5 Units Subcutaneous QHS Pixie Casino, MD      . lisinopril (PRINIVIL,ZESTRIL) tablet 2.5 mg  2.5 mg Oral Daily Leonie Man, MD   2.5 mg at 12/03/13 1022  . LORazepam (ATIVAN) tablet 1 mg  1 mg Oral Q6H PRN Inez Pilgrim, MD   1 mg at 12/03/13 2253   Or  . LORazepam (ATIVAN) injection 1 mg  1 mg Intravenous Q6H  PRN Inez Pilgrim, MD      . LORazepam (ATIVAN) tablet 0-4 mg  0-4 mg Oral Q6H Inez Pilgrim, MD   1 mg at 12/03/13 1057   Followed by  . [START ON 12/05/2013] LORazepam (ATIVAN) tablet 0-4 mg  0-4 mg Oral Q12H Inez Pilgrim, MD      . metoprolol tartrate (LOPRESSOR) tablet 12.5 mg  12.5 mg Oral BID Leonie Man, MD   12.5 mg at 12/03/13 2250  . morphine 2 MG/ML injection 2 mg  2 mg Intravenous Q1H PRN Leonie Man, MD      . multivitamin with minerals tablet 1 tablet  1 tablet Oral Daily Inez Pilgrim, MD   1 tablet at 12/03/13 1026  . nitroGLYCERIN (NITROSTAT) SL tablet 0.4 mg  0.4 mg Sublingual Q5 Min x 3 PRN Inez Pilgrim, MD   0.4 mg at 12/03/13 1039  . ondansetron (ZOFRAN) injection 4 mg  4 mg Intravenous Q6H PRN Inez Pilgrim, MD      . thiamine (VITAMIN B-1) tablet 100 mg  100 mg Oral Daily Inez Pilgrim, MD   100 mg at 12/03/13 1026   Or  . thiamine (B-1) injection 100 mg  100 mg Intravenous Daily Fenton Malling  Ulyses Amor, MD      . Ticagrelor [facility]) tablet 90 mg  90 mg Oral BID Leonie Man, MD   90 mg at 12/03/13 2253    Physical Exam: General appearance: alert and no distress Lungs: clear to auscultation bilaterally Heart: regular rate and rhythm, S1, S2 normal, no murmur, click, rub or gallop Extremities: extremities normal, atraumatic, no cyanosis or edema and intact right radial pulse, no hematoma at cath site  Lab Results: Results for orders placed during the hospital encounter of 12/03/13 (from the past 48 hour(s))  CBC     Status: None   Collection Time    12/03/13  3:10 AM      Result Value Range   WBC 10.4  4.0 - 10.5 K/uL   RBC 4.68  4.22 - 5.81 MIL/uL   Hemoglobin 14.2  13.0 - 17.0 g/dL   HCT 39.9  39.0 - 52.0 %   MCV 85.3  78.0 - 100.0 fL   MCH 30.3  26.0 - 34.0 pg   MCHC 35.6  30.0 - 36.0 g/dL   RDW 12.7  11.5 - 15.5 %   Platelets 239  150 - 400 K/uL  DIFFERENTIAL     Status: Abnormal   Collection Time    12/03/13  3:10 AM      Result  Value Range   Neutrophils Relative % 40 (*) 43 - 77 %   Lymphocytes Relative 50 (*) 12 - 46 %   Monocytes Relative 8  3 - 12 %   Eosinophils Relative 2  0 - 5 %   Basophils Relative 0  0 - 1 %   Neutro Abs 4.2  1.7 - 7.7 K/uL   Lymphs Abs 5.2 (*) 0.7 - 4.0 K/uL   Monocytes Absolute 0.8  0.1 - 1.0 K/uL   Eosinophils Absolute 0.2  0.0 - 0.7 K/uL   Basophils Absolute 0.0  0.0 - 0.1 K/uL   RBC Morphology POLYCHROMASIA PRESENT     Smear Review PLATELET CLUMPS NOTED ON SMEAR     Comment: LARGE PLATELETS PRESENT  PROTIME-INR     Status: Abnormal   Collection Time    12/03/13  3:10 AM      Result Value Range   Prothrombin Time 10.8 (*) 11.6 - 15.2 seconds   INR 0.78  0.00 - 1.49  APTT     Status: Abnormal   Collection Time    12/03/13  3:10 AM      Result Value Range   aPTT 23 (*) 24 - 37 seconds  BASIC METABOLIC PANEL     Status: Abnormal   Collection Time    12/03/13  3:10 AM      Result Value Range   Sodium 135 (*) 137 - 147 mEq/L   Comment: Please note change in reference range.   Potassium 4.3  3.7 - 5.3 mEq/L   Comment: HEMOLYSIS AT THIS LEVEL MAY AFFECT RESULT     Please note change in reference range.   Chloride 96  96 - 112 mEq/L   CO2 20  19 - 32 mEq/L   Glucose, Bld 273 (*) 70 - 99 mg/dL   BUN 9  6 - 23 mg/dL   Creatinine, Ser 0.58  0.50 - 1.35 mg/dL   Calcium 9.9  8.4 - 10.5 mg/dL   GFR calc non Af Amer >90  >90 mL/min   GFR calc Af Amer >90  >90 mL/min   Comment: (NOTE)     The  eGFR has been calculated using the CKD EPI equation.     This calculation has not been validated in all clinical situations.     eGFR's persistently <90 mL/min signify possible Chronic Kidney     Disease.  POCT I-STAT TROPONIN I     Status: None   Collection Time    12/03/13  3:10 AM      Result Value Range   Troponin i, poc 0.00  0.00 - 0.08 ng/mL   Comment 3            Comment: Due to the release kinetics of cTnI,     a negative result within the first hours     of the onset of  symptoms does not rule out     myocardial infarction with certainty.     If myocardial infarction is still suspected,     repeat the test at appropriate intervals.  POCT I-STAT, CHEM 8     Status: Abnormal   Collection Time    12/03/13  3:12 AM      Result Value Range   Sodium 136 (*) 137 - 147 mEq/L   Potassium 3.9  3.7 - 5.3 mEq/L   Chloride 103  96 - 112 mEq/L   BUN 8  6 - 23 mg/dL   Creatinine, Ser 0.70  0.50 - 1.35 mg/dL   Glucose, Bld 267 (*) 70 - 99 mg/dL   Calcium, Ion 1.21  1.12 - 1.23 mmol/L   TCO2 20  0 - 100 mmol/L   Hemoglobin 13.9  13.0 - 17.0 g/dL   HCT 41.0  39.0 - 52.0 %  POCT ACTIVATED CLOTTING TIME     Status: None   Collection Time    12/03/13  4:12 AM      Result Value Range   Activated Clotting Time 337    MRSA PCR SCREENING     Status: None   Collection Time    12/03/13  5:17 AM      Result Value Range   MRSA by PCR NEGATIVE  NEGATIVE   Comment:            The GeneXpert MRSA Assay (FDA     approved for NASAL specimens     only), is one component of a     comprehensive MRSA colonization     surveillance program. It is not     intended to diagnose MRSA     infection nor to guide or     monitor treatment for     MRSA infections.  HEMOGLOBIN A1C     Status: Abnormal   Collection Time    12/03/13  6:00 AM      Result Value Range   Hemoglobin A1C 8.1 (*) <5.7 %   Comment: (NOTE)                                                                               According to the ADA Clinical Practice Recommendations for 2011, when     HbA1c is used as a screening test:      >=6.5%   Diagnostic of Diabetes Mellitus               (  if abnormal result is confirmed)     5.7-6.4%   Increased risk of developing Diabetes Mellitus     References:Diagnosis and Classification of Diabetes Mellitus,Diabetes     FIEP,3295,18(ACZYS 1):S62-S69 and Standards of Medical Care in             Diabetes - 2011,Diabetes AYTK,1601,09 (Suppl 1):S11-S61.   Mean Plasma Glucose 186  (*) <117 mg/dL   Comment: Performed at [facility]  TSH     Status: None   Collection Time    12/03/13  6:00 AM      Result Value Range   TSH 1.337  0.350 - 4.500 uIU/mL   Comment: Performed at [facility]     Status: None   Collection Time    12/03/13  6:00 AM      Result Value Range   Magnesium 1.9  1.5 - 2.5 mg/dL  LIPID PANEL     Status: Abnormal   Collection Time    12/03/13  6:00 AM      Result Value Range   Cholesterol 218 (*) 0 - 200 mg/dL   Triglycerides 1262 (*) <150 mg/dL   HDL NOT REPORTED DUE TO HIGH TRIGLYCERIDES  >39 mg/dL   Total CHOL/HDL Ratio NOT REPORTED DUE TO HIGH TRIGLYCERIDES     VLDL UNABLE TO CALCULATE IF TRIGLYCERIDE OVER 400 mg/dL  0 - 40 mg/dL   LDL Cholesterol UNABLE TO CALCULATE IF TRIGLYCERIDE OVER 400 mg/dL  0 - 99 mg/dL   Comment:            Total Cholesterol/HDL:CHD Risk     Coronary Heart Disease Risk Table                         Men   Women      1/2 Average Risk   3.4   3.3      Average Risk       5.0   4.4      2 X Average Risk   9.6   7.1      3 X Average Risk  23.4   11.0                Use the calculated Patient Ratio     above and the CHD Risk Table     to determine the patient's CHD Risk.                ATP III CLASSIFICATION (LDL):      <100     mg/dL   Optimal      100-129  mg/dL   Near or Above                        Optimal      130-159  mg/dL   Borderline      160-189  mg/dL   High      >190     mg/dL   Very High  GLUCOSE, CAPILLARY     Status: Abnormal   Collection Time    12/03/13  8:03 AM      Result Value Range   Glucose-Capillary 227 (*) 70 - 99 mg/dL  TROPONIN I     Status: Abnormal   Collection Time    12/03/13 10:18 AM      Result Value Range   Troponin I >20.00 (*) <0.30 ng/mL   Comment: CRITICAL RESULT CALLED TO, READ BACK  BY AND VERIFIED WITH:     J.OLCZAK,RN 12/03/13 1219 CLARK,S  GLUCOSE, CAPILLARY     Status: Abnormal   Collection Time    12/03/13 12:45 PM      Result  Value Range   Glucose-Capillary 187 (*) 70 - 99 mg/dL  TROPONIN I     Status: Abnormal   Collection Time    12/03/13  3:41 PM      Result Value Range   Troponin I >20.00 (*) <0.30 ng/mL   Comment: CRITICAL RESULT CALLED TO, READ BACK BY AND VERIFIED WITH:     E. SMITH,RN [id number] SHIPMANM  GLUCOSE, CAPILLARY     Status: Abnormal   Collection Time    12/03/13  4:29 PM      Result Value Range   Glucose-Capillary 218 (*) 70 - 99 mg/dL  GLUCOSE, CAPILLARY     Status: Abnormal   Collection Time    12/03/13 10:49 PM      Result Value Range   Glucose-Capillary 188 (*) 70 - 99 mg/dL  CBC     Status: Abnormal   Collection Time    12/04/13  5:20 AM      Result Value Range   WBC 9.1  4.0 - 10.5 K/uL   RBC 4.34  4.22 - 5.81 MIL/uL   Hemoglobin 13.1  13.0 - 17.0 g/dL   HCT 49.9 (*) 31.9 - 36.5 %   MCV 86.4  78.0 - 100.0 fL   MCH 30.2  26.0 - 34.0 pg   MCHC 34.9  30.0 - 36.0 g/dL   RDW 83.6  49.4 - 02.7 %   Platelets 194  150 - 400 K/uL  BASIC METABOLIC PANEL     Status: Abnormal   Collection Time    12/04/13  5:20 AM      Result Value Range   Sodium 136 (*) 137 - 147 mEq/L   Comment: Please note change in reference range.   Potassium 4.3  3.7 - 5.3 mEq/L   Comment: Please note change in reference range.   Chloride 97  96 - 112 mEq/L   CO2 24  19 - 32 mEq/L   Glucose, Bld 200 (*) 70 - 99 mg/dL   BUN 9  6 - 23 mg/dL   Creatinine, Ser 0.04  0.50 - 1.35 mg/dL   Calcium 8.7  8.4 - 32.9 mg/dL   GFR calc non Af Amer >90  >90 mL/min   GFR calc Af Amer >90  >90 mL/min   Comment: (NOTE)     The eGFR has been calculated using the CKD EPI equation.     This calculation has not been validated in all clinical situations.     eGFR's persistently <90 mL/min signify possible Chronic Kidney     Disease.    Imaging: Dg Chest Portable 1 View  12/03/2013   CLINICAL DATA:  Chest pain. STEMI  EXAM: PORTABLE CHEST - 1 VIEW  COMPARISON:  None.  FINDINGS: Shallow inspiration. Cardiac enlargement  without vascular congestion. Infiltration or atelectasis suggested in the left mid lung. No blunting of costophrenic angles. No pneumothorax.  IMPRESSION: Shallow inspiration with infiltration or atelectasis in the left mid lung. Cardiac enlargement.   Electronically Signed   By: Burman Nieves M.D.   On: 12/03/2013 05:26    Assessment:  1. Principal Problem: 2.   STEMI (ST elevation myocardial infarction) of inf. wall 3. Active Problems: 4.   HYPERLIPIDEMIA, MIXED 5.   Diabetes mellitus type 2  with neurological manifestations 6.   CAD S/P percutaneous coronary angioplasty - Promus Premier DES 3.0 mm x 18 mm (3.5 mm) to mid RCA occlusion; no significant left-sided disease. 7.   Presence of drug coated stent in right coronary artery: Promus Premier DES 3.0 mm x 18 mm (3.5 mm) to mid RCA occlusion 12/03/12 8.   Light tobacco smoker 9.   Plan:  1. Continues to improve. Had some "chest burning" which was mild yesterday, but it has resolved. Troponin was >20. Echo shows mildly reduced EF of 45-50% with inferior hypokinesis.  I think he is appropriate for discharge today. He is ambulating without difficulty. Will restart metformin 500 mg BID as outpatient in addition to current meds. Will need follow-up with PCP for additional diabetes medications. Continue lipitor 80 mg and tricor 160 mg daily. Asa and Brillinta for at least 1 year. Will not need CIWA meds - only daily multivitamin. Does not appear to need lasix at this time, but record discharge weight and follow closely. Will need work excuse until follow-up appointment with Dr. Ellyn Hack. He is appropriate for Phase 2 outpatient cardiac rehab.  Follow-up in 1 week.  Time Spent Directly with Patient:  15 minutes  Length of Stay:  LOS: 1 day   Pixie Casino, MD, [facility] Attending Cardiologist [facility]  Markeem Noreen C 12/04/2013, 7:58 AM

## 2013-12-04 NOTE — Progress Notes (Signed)
Discharge instructions are completed. Pt will be discharged to home with wife

## 2013-12-06 ENCOUNTER — Telehealth: Payer: Self-pay | Admitting: Cardiology

## 2013-12-06 MED FILL — Sodium Chloride IV Soln 0.9%: INTRAVENOUS | Qty: 50 | Status: AC

## 2013-12-06 NOTE — Telephone Encounter (Signed)
Returned call and pt verified x 2.  Pt stated he has been having the cough since Christmas Day.  Stated they gave him Mucinex in the hospital and he took some yesterday w/o relief of cough.  Stated cough is somewhat productive w/ clear "phlegm."  Pt advised he can continue Mucinex DM, but should avoid otc medications containing pseudoephedrine unless instructed by MD.  Pt also advised to contact PCP for evaluation as cough could be r/t lisinopril he was started on or it could be viral, both of which would be evaluated at PCP's office.  Pt asked if coughing would hurt his heart.  Pt informed coughing should not hurt his heart or dislodge stent(s).  Pt again advised to see PCP for cough.  Pt verbalized understanding and agreed w/ plan.

## 2013-12-06 NOTE — Telephone Encounter (Signed)
Just got out of hospital Saturday.  Has a very bad cough.  Is concerned since had heart attack Friday Night  .  Please call

## 2013-12-13 ENCOUNTER — Telehealth: Payer: Self-pay | Admitting: Cardiology

## 2013-12-13 ENCOUNTER — Ambulatory Visit (INDEPENDENT_AMBULATORY_CARE_PROVIDER_SITE_OTHER): Payer: BC Managed Care – PPO | Admitting: Cardiology

## 2013-12-13 VITALS — BP 100/70 | HR 68 | Ht 70.0 in | Wt 210.0 lb

## 2013-12-13 DIAGNOSIS — Z9861 Coronary angioplasty status: Secondary | ICD-10-CM

## 2013-12-13 DIAGNOSIS — F17200 Nicotine dependence, unspecified, uncomplicated: Secondary | ICD-10-CM

## 2013-12-13 DIAGNOSIS — I219 Acute myocardial infarction, unspecified: Secondary | ICD-10-CM

## 2013-12-13 DIAGNOSIS — E1149 Type 2 diabetes mellitus with other diabetic neurological complication: Secondary | ICD-10-CM

## 2013-12-13 DIAGNOSIS — I213 ST elevation (STEMI) myocardial infarction of unspecified site: Secondary | ICD-10-CM

## 2013-12-13 DIAGNOSIS — E782 Mixed hyperlipidemia: Secondary | ICD-10-CM

## 2013-12-13 DIAGNOSIS — I2510 Atherosclerotic heart disease of native coronary artery without angina pectoris: Secondary | ICD-10-CM

## 2013-12-13 MED ORDER — ASPIRIN EC 81 MG PO TBEC: 81.0000 mg | DELAYED_RELEASE_TABLET | Freq: Every day | ORAL | Status: DC

## 2013-12-13 MED ORDER — SAFETY SEAL LANCETS MISC: 1.0000 | Status: DC

## 2013-12-13 NOTE — Telephone Encounter (Signed)
Patient states that he was contacted by [facility] to schedule an appointment.  He was seen in the office today and I told him if he cannot schedule to let me know and I would call.

## 2013-12-13 NOTE — Progress Notes (Signed)
Patient ID: [name] [name], male   DOB: Jul 27, 1961, 53 y.o.   MRN: [id number]  12/14/2013 [name] [name]   [birth year]  [id number]  Primary Physicia Elsie Stain, MD Primary Cardiologist: Dr. Ellyn Hack  HPI:  [name] [name] is a 53 y.o. male with a PMH of poorly controlled HLD, HTN, poorly controlled DM-2 & tobacco abuse who presented to [facility] ER on 12/03/13 with a complaint of severe substernal chest pain with associated diaphroesis and nausea. An EKG revealed inferior ST elevations with a suggestion of possible posterior infarction as well. Code STEMI was activated and he was taken urgently to the cath lab by Dr. Debby Bud for emergent LHC and PCI. He was found to have single-vessel CAD with 100% thrombotic occlusion of the dominant mid RCA. This was successfully treated with  PCI with a Xience Alpine DES 3.0 mm x 18 mm, postdilated to 3.5 mm. He had preserved LVEF with expected mild basal inferior hypokinesis and normal EDP. A 2D echo revealed an EF of 45-50%. He was placed on DAPT with ASA + Brilinta and also started on a BB, ACE-I, Lipitor and fenofibrate. The remainder of his hospitalization was uncomplicated. He was discharged home on 12/04/13.  He presents to clinic today for post-hospital f/u. Since discharge, he has been doing well. He denies any further anginal symptoms and no SOB. He has given up smoking completely. He reports daily compliance with his medications. He is overall pleased with his progress so far and seems motivated to start outpatient rehab and to meet with a dietician to improve his dietary habits.     Current Outpatient Prescriptions  Medication Sig Dispense Refill  . aspirin EC 81 MG tablet Take 1 tablet (81 mg total) by mouth daily.      Marland Kitchen atorvastatin (LIPITOR) 80 MG tablet Take 1 tablet (80 mg total) by mouth daily at 6 PM.  30 tablet  0  . fenofibrate 160 MG tablet Take 1 tablet (160 mg total) by mouth daily.  30 tablet  0  . l-methylfolate-B6-B12  (METANX) 3-35-2 MG TABS Take 1 tablet by mouth 2 (two) times daily. [employer] states Rx no longer available in tablet form, capsules authorized.      Marland Kitchen lisinopril (ZESTRIL) 2.5 MG tablet Take 1 tablet (2.5 mg total) by mouth daily.  90 tablet  3  . metFORMIN (GLUCOPHAGE) 500 MG tablet Take 1 tablet (500 mg total) by mouth 2 (two) times daily with a meal.  180 tablet  1  . metoprolol tartrate (LOPRESSOR) 25 MG tablet Take 0.5 tablets (12.5 mg total) by mouth 2 (two) times daily.  30 tablet  0  . nitroGLYCERIN (NITROSTAT) 0.4 MG SL tablet Place 1 tablet (0.4 mg total) under the tongue every 5 (five) minutes x 3 doses as needed for chest pain.  25 tablet  0  . OMEGA-3 KRILL OIL 300 MG CAPS Take by mouth daily.        . Ticagrelor (BRILINTA) 90 MG TABS tablet Take 1 tablet (90 mg total) by mouth 2 (two) times daily.  60 tablet  11  . cetirizine (ZYRTEC) 10 MG tablet Take 10 mg by mouth daily as needed.       Marland Kitchen dextromethorphan-guaiFENesin (MUCINEX DM) 30-600 MG per 12 hr tablet Take 1 tablet by mouth 2 (two) times daily.      . Multiple Vitamin (MULTIVITAMIN WITH MINERALS) TABS tablet Take 1 tablet by mouth daily.  30 tablet  6  . SAFETY  SEAL LANCETS MISC 1 cartridge by Does not apply route as directed.  1 each  5   No current facility-administered medications for this visit.    Allergies  Allergen Reactions  . Metformin And Related     Intolerant of more than 1000mg  a day    History   Social History  . Marital Status: Married    Spouse Name: N/A    Number of Children: 1  . Years of Education: N/A   Occupational History  . Operations Automotive engineer   Social History Main Topics  . Smoking status: Current Some Day Smoker -- 0.50 packs/day for 20 years    Types: Cigarettes  . Smokeless tobacco: Never Used     Comment: smoke a pack a week  . Alcohol Use: 27.0 oz/week    45 Cans of beer per week     Comment: daily  . Drug Use: No  . Sexual Activity: Not on file    Other Topics Concern  . Not on file   Social History Narrative   Married 1988 and lives with wife   One son   Works at Museum/gallery exhibitions officer, Librarian, academic     Review of Systems: General: negative for chills, fever, night sweats or weight changes.  Cardiovascular: negative for chest pain, dyspnea on exertion, edema, orthopnea, palpitations, paroxysmal nocturnal dyspnea or shortness of breath Dermatological: negative for rash Respiratory: negative for cough or wheezing Urologic: negative for hematuria Abdominal: negative for nausea, vomiting, diarrhea, bright red blood per rectum, melena, or hematemesis Neurologic: negative for visual changes, syncope, or dizziness All other systems reviewed and are otherwise negative except as noted above.    Blood pressure 100/70, pulse 68, height 5\' 10"  (1.778 m), weight 210 lb (95.255 kg).  General appearance: alert, cooperative and no distress Neck: no carotid bruit and no JVD Lungs: clear to auscultation bilaterally Heart: regular rate and rhythm, S1, S2 normal, no murmur, click, rub or gallop Abdomen: soft, non-tender; bowel sounds normal; no masses,  no organomegaly Extremities: no LEE Pulses: 2+ and symmetric Skin: warm and dry Neurologic: Grossly normal    ASSESSMENT AND PLAN:   STEMI (ST elevation myocardial infarction) of inf. wall S/p PCI 11 days ago. Stable w/o further chest pain.   CAD S/P percutaneous coronary angioplasty - Promus Premier DES 3.0 mm x 18 mm (3.5 mm) to mid RCA occlusion; no significant left-sided disease. Stable. No further chest pain. Denies SOB. He has not required use of SL NTG since being discharged. He reports compliance with medications and he understands the importance of daily compliance with his Brilinta BID. He understands that he is to take low dose ASA with Brilinta. In addition to DAPT, we willl resume him on his BB, ACE-I and statin. Due to his BP being soft, will not titrate his lisinopril at  this time. Will continue on low dose of 2.5 mg daily. Will continue Lopressor at 25 mg BID. He is in agreement to begin Phase 2 Cardiac Rehab at [facility]. Will place referral today.  Diabetes mellitus type 2 with neurological manifestations Poorly controlled with most recent Hgb a1c of 8.0. He is on Metformin. This is followed by his PCP. We discussed the importance of better glycemic control for overall cardiovascular health. I stated I would like for him to achieve a Hgb a1c goal of <7.0. We discussed the importance of proper diet and exercise. He is scheduled to meet with a dietician in  the next 2 weeks.   HYPERLIPIDEMIA, MIXED Severely elevated triglycerides. On high dose Lipitor and fenofibrate. Tolerating this well. No side effects. Would ultimately like to achieve LDL goal of < 100 and improve triglycerides. Also discussed proper diet and exercise to help improve cholesterol. Plan is to have patient to meet with a dietician.   Light tobacco smoker Has stopped smoking completely since his MI. He has been doing well with this. He has had no urge to smoke.     PLAN  Doing well post STEMI. No angina or SOB since undergoing PCI. Will continue current medication regimen. He will f/u with Cardiac rehab, for phase II. Will also f/u with a dietician to hep improve his dietary habits. He will follow-up with Dr. Ellyn Hack in 1 month to reassess.   Tiana Sivertson, BRITTAINYPA-C 12/14/2013 11:51 AM

## 2013-12-13 NOTE — Patient Instructions (Signed)
Continue taking medicines as directed Enroll in cardiac rehab Schedule appointment with dietician Continue to meet with your family doctor regularly to follow-up on diabetes Follow up with Dr. Ellyn Hack 4-5 weeks

## 2013-12-14 ENCOUNTER — Encounter: Payer: Self-pay | Admitting: Cardiology

## 2013-12-14 NOTE — Assessment & Plan Note (Addendum)
Stable. No further chest pain. Denies SOB. He has not required use of SL NTG since being discharged. He reports compliance with medications and he understands the importance of daily compliance with his Brilinta BID. He understands that he is to take low dose ASA with Brilinta. In addition to DAPT, we willl resume him on his BB, ACE-I and statin. Due to his BP being soft, will not titrate his lisinopril at this time. Will continue on low dose of 2.5 mg daily. Will continue Lopressor at 25 mg BID. He is in agreement to begin Phase 2 Cardiac Rehab at [facility]. Will place referral today.

## 2013-12-14 NOTE — Assessment & Plan Note (Addendum)
S/p PCI 11 days ago. Stable w/o further chest pain.

## 2013-12-14 NOTE — Assessment & Plan Note (Addendum)
Poorly controlled with most recent Hgb a1c of 8.0. He is on Metformin. This is followed by his PCP. We discussed the importance of better glycemic control for overall cardiovascular health. I stated I would like for him to achieve a Hgb a1c goal of <7.0. We discussed the importance of proper diet and exercise. He is scheduled to meet with a dietician in the next 2 weeks.

## 2013-12-14 NOTE — Assessment & Plan Note (Signed)
Severely elevated triglycerides. On high dose Lipitor and fenofibrate. Tolerating this well. No side effects. Would ultimately like to achieve LDL goal of < 100 and improve triglycerides. Also discussed proper diet and exercise to help improve cholesterol. Plan is to have patient to meet with a dietician.

## 2013-12-14 NOTE — Assessment & Plan Note (Signed)
Has stopped smoking completely since his MI. He has been doing well with this. He has had no urge to smoke.

## 2013-12-16 ENCOUNTER — Encounter (HOSPITAL_COMMUNITY)
Admission: RE | Admit: 2013-12-16 | Discharge: 2013-12-16 | Disposition: A | Discharge status: Home / Self Care | Payer: BC Managed Care – PPO | Source: Ambulatory Visit | Attending: Cardiology | Admitting: Cardiology

## 2013-12-16 DIAGNOSIS — I2510 Atherosclerotic heart disease of native coronary artery without angina pectoris: Secondary | ICD-10-CM | POA: Insufficient documentation

## 2013-12-16 DIAGNOSIS — Z8249 Family history of ischemic heart disease and other diseases of the circulatory system: Secondary | ICD-10-CM | POA: Insufficient documentation

## 2013-12-16 DIAGNOSIS — Z7982 Long term (current) use of aspirin: Secondary | ICD-10-CM | POA: Insufficient documentation

## 2013-12-16 DIAGNOSIS — Z5189 Encounter for other specified aftercare: Secondary | ICD-10-CM | POA: Insufficient documentation

## 2013-12-16 DIAGNOSIS — I10 Essential (primary) hypertension: Secondary | ICD-10-CM | POA: Insufficient documentation

## 2013-12-16 DIAGNOSIS — I2119 ST elevation (STEMI) myocardial infarction involving other coronary artery of inferior wall: Secondary | ICD-10-CM | POA: Insufficient documentation

## 2013-12-16 NOTE — Progress Notes (Signed)
Cardiac Rehab Medication Review by a Pharmacist  Does the patient  feel that his/her medications are working for him/her?  yes  Has the patient been experiencing any side effects to the medications prescribed?  no  Does the patient measure his/her own blood pressure or blood glucose at home?  yes (glucose)  Does the patient have any problems obtaining medications due to transportation or finances?   no  Understanding of regimen: good Understanding of indications: good Potential of compliance: excellent    Pharmacist comments: Mr. Glascoe demonstrates a good understanding of his medications and how to take them. He describes never missing a dose (sets up a pill box for adherence). He monitors his blood glucose at home, and does not state any issues obtaining his medications.     Lorenda Peck 12/16/2013 8:25 AM

## 2013-12-17 ENCOUNTER — Ambulatory Visit: Payer: BC Managed Care – PPO | Admitting: Dietician

## 2013-12-20 ENCOUNTER — Encounter (HOSPITAL_COMMUNITY)
Admission: RE | Admit: 2013-12-20 | Discharge: 2013-12-20 | Disposition: A | Discharge status: Home / Self Care | Payer: BC Managed Care – PPO | Source: Ambulatory Visit | Attending: Cardiology | Admitting: Cardiology

## 2013-12-20 ENCOUNTER — Encounter (HOSPITAL_COMMUNITY): Payer: Self-pay

## 2013-12-20 LAB — GLUCOSE, CAPILLARY
Glucose-Capillary: 151 mg/dL — ABNORMAL HIGH (ref 70–99)
Glucose-Capillary: 219 mg/dL — ABNORMAL HIGH (ref 70–99)

## 2013-12-20 NOTE — Progress Notes (Signed)
Pt started cardiac rehab today.  Pt tolerated light exercise without difficulty.  VSS, telemetry-NSR, Twave inversion. Asymptomatic.  PHQ-0.  Pt psychosocial assessment slightly altered by recent death of his mother followed by father having CVA.  Pt demonstrates normal grief pattern and exhibits positive coping skills with good family support , primarily his wife. Pt oriented to exercise equipment and routine.  Understanding verbalized.

## 2013-12-22 ENCOUNTER — Encounter (HOSPITAL_COMMUNITY)
Admission: RE | Admit: 2013-12-22 | Discharge: 2013-12-22 | Disposition: A | Discharge status: Home / Self Care | Payer: BC Managed Care – PPO | Source: Ambulatory Visit | Attending: Cardiology | Admitting: Cardiology

## 2013-12-22 LAB — GLUCOSE, CAPILLARY: Glucose-Capillary: 108 mg/dL — ABNORMAL HIGH (ref 70–99)

## 2013-12-24 ENCOUNTER — Encounter (HOSPITAL_COMMUNITY)
Admission: RE | Admit: 2013-12-24 | Discharge: 2013-12-24 | Disposition: A | Discharge status: Home / Self Care | Payer: BC Managed Care – PPO | Source: Ambulatory Visit | Attending: Cardiology | Admitting: Cardiology

## 2013-12-24 LAB — GLUCOSE, CAPILLARY: Glucose-Capillary: 108 mg/dL — ABNORMAL HIGH (ref 70–99)

## 2013-12-27 ENCOUNTER — Encounter (HOSPITAL_COMMUNITY)
Admission: RE | Admit: 2013-12-27 | Discharge: 2013-12-27 | Disposition: A | Discharge status: Home / Self Care | Payer: BC Managed Care – PPO | Source: Ambulatory Visit | Attending: Cardiology | Admitting: Cardiology

## 2013-12-27 NOTE — Progress Notes (Signed)
I have reviewed home exercise with [name] [name]. The patient was advised to walk 2-4 days per week for 15 minutes, 2 times per day until he can walk 30 minutes continuously.  Pt will also complete one additional day of hand weights outside of CRP II.  Progression of exercise prescription was discussed.  Reviewed THR, pulse, RPE, sign and symptoms, NTG use and when to call 911 or MD.  Pt voiced understanding.  Steger, MS, ACSM RCEP 12/27/2013 4:56 PM

## 2013-12-29 ENCOUNTER — Encounter (HOSPITAL_COMMUNITY)
Admission: RE | Admit: 2013-12-29 | Discharge: 2013-12-29 | Disposition: A | Discharge status: Home / Self Care | Payer: BC Managed Care – PPO | Source: Ambulatory Visit | Attending: Cardiology | Admitting: Cardiology

## 2013-12-31 ENCOUNTER — Encounter (HOSPITAL_COMMUNITY)
Admission: RE | Admit: 2013-12-31 | Discharge: 2013-12-31 | Disposition: A | Discharge status: Home / Self Care | Payer: BC Managed Care – PPO | Source: Ambulatory Visit | Attending: Cardiology | Admitting: Cardiology

## 2014-01-03 ENCOUNTER — Encounter (HOSPITAL_COMMUNITY)
Admission: RE | Admit: 2014-01-03 | Discharge: 2014-01-03 | Disposition: A | Discharge status: Home / Self Care | Payer: BC Managed Care – PPO | Source: Ambulatory Visit | Attending: Cardiology | Admitting: Cardiology

## 2014-01-03 DIAGNOSIS — I2510 Atherosclerotic heart disease of native coronary artery without angina pectoris: Secondary | ICD-10-CM | POA: Insufficient documentation

## 2014-01-03 DIAGNOSIS — Z5189 Encounter for other specified aftercare: Secondary | ICD-10-CM | POA: Insufficient documentation

## 2014-01-03 DIAGNOSIS — I10 Essential (primary) hypertension: Secondary | ICD-10-CM | POA: Insufficient documentation

## 2014-01-03 DIAGNOSIS — I252 Old myocardial infarction: Secondary | ICD-10-CM | POA: Insufficient documentation

## 2014-01-03 DIAGNOSIS — Z8249 Family history of ischemic heart disease and other diseases of the circulatory system: Secondary | ICD-10-CM | POA: Insufficient documentation

## 2014-01-03 DIAGNOSIS — Z7982 Long term (current) use of aspirin: Secondary | ICD-10-CM | POA: Insufficient documentation

## 2014-01-03 NOTE — Progress Notes (Signed)
Nutrition Education Time Spent: 1.5 hours Spoke with pt and pt's wife. Pt and wife educated re: etiology of DM, how DM medication (Metformin) works to control DM, and DM diet. Pt given handout for 250 gram Consistent CHO diet, list of AHA approved foods, and nutrition information for eating out. Pt and pt wife exhibited understanding of information reviewed. Continue client-centered nutrition education by RD as part of interdisciplinary care.  Monitor and evaluate progress toward nutrition goal with team.  Derek Mound, M.Ed, RD, LDN, CDE 01/03/2014 4:24 PM

## 2014-01-05 ENCOUNTER — Encounter (HOSPITAL_COMMUNITY)
Admission: RE | Admit: 2014-01-05 | Discharge: 2014-01-05 | Disposition: A | Discharge status: Home / Self Care | Payer: BC Managed Care – PPO | Source: Ambulatory Visit | Attending: Cardiology | Admitting: Cardiology

## 2014-01-07 ENCOUNTER — Encounter (HOSPITAL_COMMUNITY)
Admission: RE | Admit: 2014-01-07 | Discharge: 2014-01-07 | Disposition: A | Discharge status: Home / Self Care | Payer: BC Managed Care – PPO | Source: Ambulatory Visit | Attending: Cardiology | Admitting: Cardiology

## 2014-01-07 ENCOUNTER — Telehealth: Payer: Self-pay | Admitting: Cardiology

## 2014-01-07 NOTE — Telephone Encounter (Signed)
Patient was here on 12/13/2013 and was referred to see a dietician.  Patient stated at that time that he would call if we needed to schedule the appointment for him.

## 2014-01-10 ENCOUNTER — Encounter (HOSPITAL_COMMUNITY)
Admission: RE | Admit: 2014-01-10 | Discharge: 2014-01-10 | Disposition: A | Discharge status: Home / Self Care | Payer: BC Managed Care – PPO | Source: Ambulatory Visit | Attending: Cardiology | Admitting: Cardiology

## 2014-01-12 ENCOUNTER — Encounter (HOSPITAL_COMMUNITY)
Admission: RE | Admit: 2014-01-12 | Discharge: 2014-01-12 | Disposition: A | Discharge status: Home / Self Care | Payer: BC Managed Care – PPO | Source: Ambulatory Visit | Attending: Cardiology | Admitting: Cardiology

## 2014-01-14 ENCOUNTER — Encounter (HOSPITAL_COMMUNITY): Payer: BC Managed Care – PPO

## 2014-01-17 ENCOUNTER — Encounter (HOSPITAL_COMMUNITY)
Admission: RE | Admit: 2014-01-17 | Discharge: 2014-01-17 | Disposition: A | Discharge status: Home / Self Care | Payer: BC Managed Care – PPO | Source: Ambulatory Visit | Attending: Cardiology | Admitting: Cardiology

## 2014-01-17 NOTE — Progress Notes (Signed)
Nutrition Education Time Spent: 1 hour Spoke with pt and pt's wife, [name] [name]. Reviewed DM education from previous meeting. Breakfast choices reviewed with pt. Pt currently drinking a high protein, fruit smoothie with flaxseed for breakfast, which provides approximately 60-70 grams of carbs and eating a protein bar for snack mid-morning, which provides about 21 grams of carbs. Pt reports eating a salad with tuna, low-fat cheese and fat-free salad dressing for lunch. The importance of incorporating carbs along with protein and fat discussed. Pt agreed to add 7 triscuits, 1/2 cup fruit in it's own juice or lite syrup, and Greek yogurt with lunch. Alternatives to patient's lunch discussed (e.g. Sandwich or choices when eating out). Pt and pt wife exhibited understanding of information reviewed. Continue client-centered nutrition education by RD as part of interdisciplinary care.  Monitor and evaluate progress toward nutrition goal with team.  [name] [name], M.Ed, RD, LDN, CDE 01/17/2014 3:39 PM

## 2014-01-18 ENCOUNTER — Ambulatory Visit: Payer: BC Managed Care – PPO | Admitting: Cardiology

## 2014-01-19 ENCOUNTER — Encounter (HOSPITAL_COMMUNITY)
Admission: RE | Admit: 2014-01-19 | Discharge: 2014-01-19 | Disposition: A | Discharge status: Home / Self Care | Payer: BC Managed Care – PPO | Source: Ambulatory Visit | Attending: Cardiology | Admitting: Cardiology

## 2014-01-19 ENCOUNTER — Other Ambulatory Visit: Payer: Self-pay | Admitting: Cardiology

## 2014-01-19 NOTE — Telephone Encounter (Signed)
Rx was sent to pharmacy electronically. 

## 2014-01-21 ENCOUNTER — Encounter (HOSPITAL_COMMUNITY)
Admission: RE | Admit: 2014-01-21 | Discharge: 2014-01-21 | Disposition: A | Discharge status: Home / Self Care | Payer: BC Managed Care – PPO | Source: Ambulatory Visit | Attending: Cardiology | Admitting: Cardiology

## 2014-01-21 LAB — GLUCOSE, CAPILLARY: Glucose-Capillary: 194 mg/dL — ABNORMAL HIGH (ref 70–99)

## 2014-01-24 ENCOUNTER — Encounter (HOSPITAL_COMMUNITY)
Admission: RE | Admit: 2014-01-24 | Discharge: 2014-01-24 | Disposition: A | Discharge status: Home / Self Care | Payer: BC Managed Care – PPO | Source: Ambulatory Visit | Attending: Cardiology | Admitting: Cardiology

## 2014-01-24 LAB — GLUCOSE, CAPILLARY: Glucose-Capillary: 143 mg/dL — ABNORMAL HIGH (ref 70–99)

## 2014-01-25 ENCOUNTER — Encounter: Payer: Self-pay | Admitting: Cardiology

## 2014-01-25 ENCOUNTER — Ambulatory Visit (INDEPENDENT_AMBULATORY_CARE_PROVIDER_SITE_OTHER): Payer: BC Managed Care – PPO | Admitting: Cardiology

## 2014-01-25 VITALS — BP 142/80 | HR 77 | Ht 70.5 in | Wt 212.9 lb

## 2014-01-25 DIAGNOSIS — K219 Gastro-esophageal reflux disease without esophagitis: Secondary | ICD-10-CM

## 2014-01-25 DIAGNOSIS — I219 Acute myocardial infarction, unspecified: Secondary | ICD-10-CM

## 2014-01-25 DIAGNOSIS — Z9861 Coronary angioplasty status: Secondary | ICD-10-CM

## 2014-01-25 DIAGNOSIS — I2510 Atherosclerotic heart disease of native coronary artery without angina pectoris: Secondary | ICD-10-CM

## 2014-01-25 DIAGNOSIS — I10 Essential (primary) hypertension: Secondary | ICD-10-CM

## 2014-01-25 DIAGNOSIS — I213 ST elevation (STEMI) myocardial infarction of unspecified site: Secondary | ICD-10-CM

## 2014-01-25 DIAGNOSIS — E782 Mixed hyperlipidemia: Secondary | ICD-10-CM

## 2014-01-25 DIAGNOSIS — F17200 Nicotine dependence, unspecified, uncomplicated: Secondary | ICD-10-CM

## 2014-01-25 NOTE — Patient Instructions (Signed)
Try Over The Counter Prilosec and let us know if your symptoms resolve.  Your physician recommends that you schedule a follow-up appointment in: 3 months.

## 2014-01-26 ENCOUNTER — Encounter (HOSPITAL_COMMUNITY)
Admission: RE | Admit: 2014-01-26 | Discharge: 2014-01-26 | Disposition: A | Discharge status: Home / Self Care | Payer: BC Managed Care – PPO | Source: Ambulatory Visit | Attending: Cardiology | Admitting: Cardiology

## 2014-01-27 ENCOUNTER — Encounter: Payer: Self-pay | Admitting: Cardiology

## 2014-01-27 DIAGNOSIS — I10 Essential (primary) hypertension: Secondary | ICD-10-CM | POA: Insufficient documentation

## 2014-01-27 NOTE — Progress Notes (Signed)
PATIENT: [name] [name] MRN: [id number]  DOB: [birth year]   DOV:01/27/2014 PCP: Elsie Stain, MD  Clinic Note: Chief Complaint  Patient presents with  . Follow-up    No chest pain, SOB, or edema.  Occas. dizziness.  Last week experienced numbess and tingling of right fingers and toes - they were cold to touch but no discoloration. Frequent indigestion after intake of food or liquids relieved w/tums.  Occas. headaches.    HPI: [name] [name] is a 53 y.o.  male with a PMH below who presents today for his second followup after his inferior STEMI in early January. On January 2nd he presented with an inferior STEMI, and was taken emergently to the cardiac catheterization lab where he had his RCA opened up with a Xience DES stent. He saw [name], PA on the 12th for quick followup and was doing quite well. He was motivated to quit smoking and to start cardiac rehabilitation. He still has several questions about returning back to work and is routine activity.  Interval History: He is feeling quite well any major complaints or concerns. He has had some GERD-type symptoms with epigastric burning. He did not have any further real anginal type symptoms with rest or exertion. No is rather pressures are so. No heart failure symptoms such as PND, orthopnea or edema. No melena hematochezia or hematuria. No claudication. No rapid or irregular heartbeats, no lightheadedness, dizziness weakness or syncope/near syncope. No TIA or RCA symptoms. He has several questions about when he can return back to work.  Past Medical History  Diagnosis Date  . Allergic rhinitis   . Fracture of right hand 1983  . Paresthesia     tingling in feet  . Hyperlipidemia   . Nocturia   . Hypertension   . Diabetes mellitus     type2  . STEMI (ST elevation myocardial infarction) of inf. wall 12/03/2013  . CAD S/P percutaneous coronary angioplasty 12/03/2013    PCI-RCA - Xience Alpine DES 3.0 mm x 18 mm --> 3.5  mm    Prior Cardiac Evaluation and Past Surgical History: Past Surgical History  Procedure Laterality Date  . Septoplasty  1970    due to Fx Nose  . Knee surgery      B arthroscopic  . Percutaneous coronary stent intervention (pci-s)  12/03/2013    mRCA - Xience Alpine DES 3.0 mm x 18 mm --> 3.5 mm  . Transthoracic echocardiogram  12/03/2013    Mild Conc LVH; EF 45-50%, Mild Inferior HK. Grade 1 DD    Allergies  Allergen Reactions  . Metformin And Related     Intolerant of more than 1000mg  a day    Current Outpatient Prescriptions  Medication Sig Dispense Refill  . aspirin EC 81 MG tablet Take 1 tablet (81 mg total) by mouth daily.      Marland Kitchen atorvastatin (LIPITOR) 80 MG tablet Take 1 tablet (80 mg total) by mouth daily at 6 PM.  30 tablet  0  . BRILINTA 90 MG TABS tablet TAKE 1 TABLET BY MOUTH TWICE A DAY  60 tablet  11  . fenofibrate 160 MG tablet Take 1 tablet (160 mg total) by mouth daily.  30 tablet  0  . folic acid (FOLVITE) 1 MG tablet Take 1 mg by mouth daily.      Marland Kitchen l-methylfolate-B6-B12 (METANX) 3-35-2 MG TABS Take 1 tablet by mouth 2 (two) times daily. [employer] states Rx no longer available in tablet form, capsules  authorized.      Marland Kitchen lisinopril (ZESTRIL) 2.5 MG tablet Take 1 tablet (2.5 mg total) by mouth daily.  90 tablet  3  . metFORMIN (GLUCOPHAGE) 500 MG tablet Take 1 tablet (500 mg total) by mouth 2 (two) times daily with a meal.  180 tablet  1  . metoprolol tartrate (LOPRESSOR) 25 MG tablet Take 0.5 tablets (12.5 mg total) by mouth 2 (two) times daily.  30 tablet  0  . Multiple Vitamin (MULTIVITAMIN WITH MINERALS) TABS tablet Take 1 tablet by mouth daily.  30 tablet  6  . nitroGLYCERIN (NITROSTAT) 0.4 MG SL tablet Place 1 tablet (0.4 mg total) under the tongue every 5 (five) minutes x 3 doses as needed for chest pain.  25 tablet  0  . OMEGA-3 KRILL OIL 300 MG CAPS Take by mouth daily.       Marland Kitchen SAFETY SEAL LANCETS MISC 1 cartridge by Does not apply route as directed.  1  each  5   No current facility-administered medications for this visit.    History   Social History Narrative   Married 1988 and lives with wife   One son   Works at Editor, commissioning, Merchandiser, retail.   Forward 10 pack year smoker, who quit on the day of his MI.          ROS: A comprehensive Review of Systems - Negative except GERD Symptoms noted in history of present illness per  PHYSICAL EXAM BP 142/80  Pulse 77  Ht 5' 10.5" (1.791 m)  Wt 212 lb 14.4 oz (96.571 kg)  BMI 30.11 kg/m2 General appearance: alert, cooperative, appears stated age, no distress and Well-nourished/well-groomed. Neck: no adenopathy, no carotid bruit, no JVD and supple, symmetrical, trachea midline Lungs: clear to auscultation bilaterally, normal percussion bilaterally and Nonlabored, good air movement Heart: regular rate and rhythm, S1, S2 normal, no murmur, click, rub or gallop and normal apical impulse Abdomen: soft, non-tender; bowel sounds normal; no masses,  no organomegaly Extremities: extremities normal, atraumatic, no cyanosis or edema Pulses: 2+ and symmetric Neurologic: Alert and oriented X 3, normal strength and tone. Normal symmetric reflexes. Normal coordination and gait  [id number] today: Yes Rate: 77 , Rhythm: SR with PAC. Inferior MI, age undetermined  Recent Labs: None Since last visit.  ASSESSMENT / PLAN: CAD S/P percutaneous coronary angioplasty - Promus Premier DES 3.0 mm x 18 mm (3.5 mm) to mid RCA occlusion; no significant left-sided disease. He remains relatively stable on dual therapy. He is also on combination fenofibrate plus statin in addition to beta blocker and ACE inhibitor.  Light tobacco smoker Quit smoking at the time of his MI.  HYPERLIPIDEMIA, MIXED -- significant hypertriglyceridemia We will need to be check his lipids in the future. It is triglycerides were extremely elevated during the time of his MI. He is on combination of fenofibrate, statin and fish  oil. I will recheck in a few months following his next visit.  Essential hypertension Borderline blood pressures on beta blocker and ACE inhibitor. We'll need to monitor, and we will likely need to increase his ACE inhibitor dose to 5 mg daily.  GERD Symptoms are quite suggestive of exacerbation of baseline GERD symptoms with being on dual antiplatelet therapy. I recommended that he try over-the-counter Prilosec to see if it would help. If there is some relief, we would then probably prescribe Nexium as a more potent option.    Orders Placed This Encounter  Procedures  . EKG 12-Lead  Meds ordered this encounter  Medications  . folic acid (FOLVITE) 1 MG tablet    Sig: Take 1 mg by mouth daily.    Followup: 3 months  DAVID W. Ellyn Hack, M.D., M.S. Interventional Cardiology [facility]

## 2014-01-27 NOTE — Assessment & Plan Note (Signed)
Borderline blood pressures on beta blocker and ACE inhibitor. We'll need to monitor, and we will likely need to increase his ACE inhibitor dose to 5 mg daily.

## 2014-01-27 NOTE — Assessment & Plan Note (Signed)
He remains relatively stable on dual therapy. He is also on combination fenofibrate plus statin in addition to beta blocker and ACE inhibitor.

## 2014-01-27 NOTE — Assessment & Plan Note (Signed)
We will need to be check his lipids in the future. It is triglycerides were extremely elevated during the time of his MI. He is on combination of fenofibrate, statin and fish oil. I will recheck in a few months following his next visit.

## 2014-01-27 NOTE — Assessment & Plan Note (Signed)
Quit smoking at the time of his MI.

## 2014-01-27 NOTE — Assessment & Plan Note (Signed)
Symptoms are quite suggestive of exacerbation of baseline GERD symptoms with being on dual antiplatelet therapy. I recommended that he try over-the-counter Prilosec to see if it would help. If there is some relief, we would then probably prescribe Nexium as a more potent option.

## 2014-01-28 ENCOUNTER — Encounter (HOSPITAL_COMMUNITY)
Admission: RE | Admit: 2014-01-28 | Discharge: 2014-01-28 | Disposition: A | Discharge status: Home / Self Care | Payer: BC Managed Care – PPO | Source: Ambulatory Visit | Attending: Cardiology | Admitting: Cardiology

## 2014-01-31 ENCOUNTER — Encounter (HOSPITAL_COMMUNITY)
Admission: RE | Admit: 2014-01-31 | Discharge: 2014-01-31 | Disposition: A | Discharge status: Home / Self Care | Payer: BC Managed Care – PPO | Source: Ambulatory Visit | Attending: Cardiology | Admitting: Cardiology

## 2014-01-31 DIAGNOSIS — Z5189 Encounter for other specified aftercare: Secondary | ICD-10-CM | POA: Insufficient documentation

## 2014-01-31 DIAGNOSIS — Z7982 Long term (current) use of aspirin: Secondary | ICD-10-CM | POA: Insufficient documentation

## 2014-01-31 DIAGNOSIS — Z8249 Family history of ischemic heart disease and other diseases of the circulatory system: Secondary | ICD-10-CM | POA: Insufficient documentation

## 2014-01-31 DIAGNOSIS — I10 Essential (primary) hypertension: Secondary | ICD-10-CM | POA: Insufficient documentation

## 2014-01-31 DIAGNOSIS — I2510 Atherosclerotic heart disease of native coronary artery without angina pectoris: Secondary | ICD-10-CM | POA: Insufficient documentation

## 2014-01-31 DIAGNOSIS — I252 Old myocardial infarction: Secondary | ICD-10-CM | POA: Insufficient documentation

## 2014-02-02 ENCOUNTER — Encounter (HOSPITAL_COMMUNITY)
Admission: RE | Admit: 2014-02-02 | Discharge: 2014-02-02 | Disposition: A | Discharge status: Home / Self Care | Payer: BC Managed Care – PPO | Source: Ambulatory Visit | Attending: Cardiology | Admitting: Cardiology

## 2014-02-02 LAB — GLUCOSE, CAPILLARY: GLUCOSE-CAPILLARY: 189 mg/dL — AB (ref 70–99)

## 2014-02-02 NOTE — Progress Notes (Signed)
Nutrition Note Spoke with pt. Pt requested handouts for Nutrition I and II classes. Handouts provided. Continue client-centered nutrition education by RD as part of interdisciplinary care.  Monitor and evaluate progress toward nutrition goal with team.  Derek Mound, M.Ed, RD, LDN, CDE 02/02/2014 2:49 PM

## 2014-02-04 ENCOUNTER — Encounter (HOSPITAL_COMMUNITY)
Admission: RE | Admit: 2014-02-04 | Discharge: 2014-02-04 | Disposition: A | Discharge status: Home / Self Care | Payer: BC Managed Care – PPO | Source: Ambulatory Visit | Attending: Cardiology | Admitting: Cardiology

## 2014-02-07 ENCOUNTER — Encounter (HOSPITAL_COMMUNITY)
Admission: RE | Admit: 2014-02-07 | Discharge: 2014-02-07 | Disposition: A | Discharge status: Home / Self Care | Payer: BC Managed Care – PPO | Source: Ambulatory Visit | Attending: Cardiology | Admitting: Cardiology

## 2014-02-07 NOTE — Progress Notes (Signed)
Nutrition Education Time Spent: 1 hour Spoke with pt and pt's wife, [name] [name]. Reviewed DM education from previous meeting. Breakfast choices reviewed with pt. Pt continues drinking a fruit smoothie with flaxseed for breakfast and eating a protein bar or handful of nuts for snack mid-morning. Pt's wife states she has not "yet" ordered the protein supplement for the am smoothies and states "I know I need to do it because the smoothie isn't lasting as long as it used to." Pt reports he is now eating "something quick for lunch (e.g. [employer] grilled chicken sandwich) so I can come to Cardiac Rehab." Pt and pt wife feel dinner is going well and generally includes a high fiber carb, lean protein, and vegetable. Pt incorporates a bedtime snack of either Smart Pop popcorn, peanut butter and no added sugar jam on graham crackers, or rarely "a no added sugar Klondike bar with peanut butter." Pt checks his CBG's 3 times a week "usually before rehab." Using the control solution with the glucometer and glucometer hygiene discussed. Pt reports he is having labs drawn tomorrow and sees the MD Wed 02/09/14. Pt encouraged to discuss frequency of CBG checks needed with MD after an updated A1c is available. Proper DM foot care reviewed. Pt sees a podiatrist due to peripheral neuropathy and wears orthotics. Pt and pt wife exhibited understanding of information reviewed. Continue client-centered nutrition education by RD as part of interdisciplinary care.  Monitor and evaluate progress toward nutrition goal with team.  Derek Mound, M.Ed, RD, LDN, CDE 02/07/2014 4:08 PM

## 2014-02-08 ENCOUNTER — Other Ambulatory Visit: Payer: Self-pay | Admitting: Family Medicine

## 2014-02-08 ENCOUNTER — Other Ambulatory Visit (INDEPENDENT_AMBULATORY_CARE_PROVIDER_SITE_OTHER): Payer: BC Managed Care – PPO

## 2014-02-08 DIAGNOSIS — E1149 Type 2 diabetes mellitus with other diabetic neurological complication: Secondary | ICD-10-CM

## 2014-02-08 DIAGNOSIS — E119 Type 2 diabetes mellitus without complications: Secondary | ICD-10-CM

## 2014-02-08 LAB — LIPID PANEL
Cholesterol: 198 mg/dL (ref 0–200)
HDL: 36.3 mg/dL — ABNORMAL LOW (ref >39.00)
LDL CALC: 81 mg/dL (ref 0–99)
Total CHOL/HDL Ratio: 5
Triglycerides: 403 mg/dL — ABNORMAL HIGH (ref 0.0–149.0)
VLDL: 80.6 mg/dL — AB (ref 0.0–40.0)

## 2014-02-08 LAB — COMPREHENSIVE METABOLIC PANEL
ALK PHOS: 38 U/L — AB (ref 39–117)
ALT: 42 U/L (ref 0–53)
AST: 25 U/L (ref 0–37)
Albumin: 4.4 g/dL (ref 3.5–5.2)
BILIRUBIN TOTAL: 0.7 mg/dL (ref 0.3–1.2)
BUN: 16 mg/dL (ref 6–23)
CO2: 25 mEq/L (ref 19–32)
Calcium: 9.1 mg/dL (ref 8.4–10.5)
Chloride: 106 mEq/L (ref 96–112)
Creatinine, Ser: 0.6 mg/dL (ref 0.4–1.5)
GFR: 138.39 mL/min (ref >60.00)
Glucose, Bld: 173 mg/dL — ABNORMAL HIGH (ref 70–99)
Potassium: 4.4 mEq/L (ref 3.5–5.1)
SODIUM: 138 meq/L (ref 135–145)
TOTAL PROTEIN: 6.9 g/dL (ref 6.0–8.3)

## 2014-02-08 LAB — HEMOGLOBIN A1C: HEMOGLOBIN A1C: 7.8 % — AB (ref 4.6–6.5)

## 2014-02-08 NOTE — Addendum Note (Signed)
Addended by: Marchia Bond on: 02/08/2014 08:36 AM   Modules accepted: Orders

## 2014-02-09 ENCOUNTER — Encounter (HOSPITAL_COMMUNITY)
Admission: RE | Admit: 2014-02-09 | Discharge: 2014-02-09 | Disposition: A | Discharge status: Home / Self Care | Payer: BC Managed Care – PPO | Source: Ambulatory Visit | Attending: Cardiology | Admitting: Cardiology

## 2014-02-10 ENCOUNTER — Ambulatory Visit (INDEPENDENT_AMBULATORY_CARE_PROVIDER_SITE_OTHER): Payer: BC Managed Care – PPO | Admitting: Family Medicine

## 2014-02-10 ENCOUNTER — Encounter: Payer: Self-pay | Admitting: Family Medicine

## 2014-02-10 VITALS — BP 106/70 | HR 74 | Temp 98.3°F | Wt 209.0 lb

## 2014-02-10 DIAGNOSIS — E1149 Type 2 diabetes mellitus with other diabetic neurological complication: Secondary | ICD-10-CM

## 2014-02-10 DIAGNOSIS — E782 Mixed hyperlipidemia: Secondary | ICD-10-CM

## 2014-02-10 DIAGNOSIS — E119 Type 2 diabetes mellitus without complications: Secondary | ICD-10-CM

## 2014-02-10 DIAGNOSIS — I2510 Atherosclerotic heart disease of native coronary artery without angina pectoris: Secondary | ICD-10-CM

## 2014-02-10 DIAGNOSIS — Z9861 Coronary angioplasty status: Secondary | ICD-10-CM

## 2014-02-10 MED ORDER — GLIPIZIDE 5 MG PO TABS: 5.0000 mg | ORAL_TABLET | Freq: Every day | ORAL | Status: DC

## 2014-02-10 MED ORDER — L-METHYLFOLATE-B6-B12 3-35-2 MG PO TABS: 1.0000 | ORAL_TABLET | Freq: Two times a day (BID) | ORAL | Status: DC

## 2014-02-10 MED ORDER — METFORMIN HCL 500 MG PO TABS: 500.0000 mg | ORAL_TABLET | Freq: Two times a day (BID) | ORAL | Status: DC

## 2014-02-10 NOTE — Assessment & Plan Note (Signed)
No CP, SOB, BLE edema. Feels well.  Continue secondary prevention.  Will recheck lipids with next A1c. He agrees with plan.

## 2014-02-10 NOTE — Progress Notes (Signed)
Pre visit review using our clinic review tool, if applicable. No additional management support is needed unless otherwise documented below in the visit note.  Admitted with CP, STEMI, stented and then here for f/u today.  D/w pt about labs.  No CP, SOB, BLE edema. Still in cardiac rehab.  No bleeding or sig bruising.    DM2.  A1c still up.  Discussed options.  Still on metformin BID.  No low. 120-150s usually in AM.  A1c 7.8. Discussed diet, much improved.    Hospital records reviewed.    PMH and SH reviewed  ROS: See HPI, otherwise noncontributory.  Meds, vitals, and allergies reviewed.   GEN: nad, alert and oriented HEENT: mucous membranes moist NECK: supple w/o LA CV: rrr PULM: ctab, no inc wob ABD: soft, +bs EXT: no edema SKIN: no acute rash  Diabetic foot exam: Normal inspection No skin breakdown No calluses  Normal DP pulses Normal sensation to light touch and monofilament Nails normal

## 2014-02-10 NOTE — Assessment & Plan Note (Signed)
Add on glipizide, continue diet and exercise.  Recheck in 3 months.  D/w pt.  He agrees.

## 2014-02-10 NOTE — Assessment & Plan Note (Signed)
D/w pt.  Hopefully dec in A1c additional med (see above) will help TG. Continue diet and exercise.

## 2014-02-10 NOTE — Addendum Note (Signed)
Addended by: Ellamae Sia on: 02/10/2014 05:10 PM   Modules accepted: Orders

## 2014-02-10 NOTE — Patient Instructions (Signed)
Keep working on your diet, recheck your A1c in about 3 months before a visit.  Add on 5mg  of glipizide each AM.  Check your sugar before and then 2 hours after a meal after the med change.  Update me at that point.  If you have low sugars, stop the glipizide and notify me.  Take care.

## 2014-02-11 ENCOUNTER — Encounter (HOSPITAL_COMMUNITY)
Admission: RE | Admit: 2014-02-11 | Discharge: 2014-02-11 | Disposition: A | Discharge status: Home / Self Care | Payer: BC Managed Care – PPO | Source: Ambulatory Visit | Attending: Cardiology | Admitting: Cardiology

## 2014-02-11 ENCOUNTER — Telehealth: Payer: Self-pay

## 2014-02-11 NOTE — Telephone Encounter (Signed)
Relevant patient education assigned to patient using Emmi. ° °

## 2014-02-14 ENCOUNTER — Encounter (HOSPITAL_COMMUNITY)
Admission: RE | Admit: 2014-02-14 | Discharge: 2014-02-14 | Disposition: A | Discharge status: Home / Self Care | Payer: BC Managed Care – PPO | Source: Ambulatory Visit | Attending: Cardiology | Admitting: Cardiology

## 2014-02-16 ENCOUNTER — Encounter (HOSPITAL_COMMUNITY)
Admission: RE | Admit: 2014-02-16 | Discharge: 2014-02-16 | Disposition: A | Discharge status: Home / Self Care | Payer: BC Managed Care – PPO | Source: Ambulatory Visit | Attending: Cardiology | Admitting: Cardiology

## 2014-02-16 ENCOUNTER — Ambulatory Visit: Payer: BC Managed Care – PPO | Admitting: Cardiology

## 2014-02-18 ENCOUNTER — Encounter (HOSPITAL_COMMUNITY)
Admission: RE | Admit: 2014-02-18 | Discharge: 2014-02-18 | Disposition: A | Discharge status: Home / Self Care | Payer: BC Managed Care – PPO | Source: Ambulatory Visit | Attending: Cardiology | Admitting: Cardiology

## 2014-02-18 LAB — GLUCOSE, CAPILLARY
Glucose-Capillary: 126 mg/dL — ABNORMAL HIGH (ref 70–99)
Glucose-Capillary: 160 mg/dL — ABNORMAL HIGH (ref 70–99)

## 2014-02-18 NOTE — Progress Notes (Signed)
Pt arrived at cardiac rehab reporting he was started on glucotrol 5mg  once daily per PCP.  Pt has not checked his CBG today.   Pt instructed to check CBG prior to exercise for safety. Pt also instructed we will monitor CBG closely following this medication change to watch for hypoglycemia.  Pt also instructed to bring home meter with him to rehab to verify  Accuracy.  Understanding verbalized

## 2014-02-21 ENCOUNTER — Encounter (HOSPITAL_COMMUNITY)
Admission: RE | Admit: 2014-02-21 | Discharge: 2014-02-21 | Disposition: A | Discharge status: Home / Self Care | Payer: BC Managed Care – PPO | Source: Ambulatory Visit | Attending: Cardiology | Admitting: Cardiology

## 2014-02-21 LAB — GLUCOSE, CAPILLARY
Glucose-Capillary: 171 mg/dL — ABNORMAL HIGH (ref 70–99)
Glucose-Capillary: 182 mg/dL — ABNORMAL HIGH (ref 70–99)

## 2014-02-23 ENCOUNTER — Encounter (HOSPITAL_COMMUNITY)
Admission: RE | Admit: 2014-02-23 | Discharge: 2014-02-23 | Disposition: A | Discharge status: Home / Self Care | Payer: BC Managed Care – PPO | Source: Ambulatory Visit | Attending: Cardiology | Admitting: Cardiology

## 2014-02-23 LAB — GLUCOSE, CAPILLARY
GLUCOSE-CAPILLARY: 129 mg/dL — AB (ref 70–99)
Glucose-Capillary: 110 mg/dL — ABNORMAL HIGH (ref 70–99)

## 2014-02-25 ENCOUNTER — Encounter (HOSPITAL_COMMUNITY)
Admission: RE | Admit: 2014-02-25 | Discharge: 2014-02-25 | Disposition: A | Discharge status: Home / Self Care | Payer: BC Managed Care – PPO | Source: Ambulatory Visit | Attending: Cardiology | Admitting: Cardiology

## 2014-02-25 LAB — GLUCOSE, CAPILLARY: Glucose-Capillary: 123 mg/dL — ABNORMAL HIGH (ref 70–99)

## 2014-02-28 ENCOUNTER — Encounter (HOSPITAL_COMMUNITY)
Admission: RE | Admit: 2014-02-28 | Discharge: 2014-02-28 | Disposition: A | Discharge status: Home / Self Care | Payer: BC Managed Care – PPO | Source: Ambulatory Visit | Attending: Cardiology | Admitting: Cardiology

## 2014-02-28 LAB — GLUCOSE, CAPILLARY: Glucose-Capillary: 166 mg/dL — ABNORMAL HIGH (ref 70–99)

## 2014-03-02 ENCOUNTER — Encounter (HOSPITAL_COMMUNITY)
Admission: RE | Admit: 2014-03-02 | Discharge: 2014-03-02 | Disposition: A | Discharge status: Home / Self Care | Payer: BC Managed Care – PPO | Source: Ambulatory Visit | Attending: Cardiology | Admitting: Cardiology

## 2014-03-02 DIAGNOSIS — I10 Essential (primary) hypertension: Secondary | ICD-10-CM | POA: Insufficient documentation

## 2014-03-02 DIAGNOSIS — Z8249 Family history of ischemic heart disease and other diseases of the circulatory system: Secondary | ICD-10-CM | POA: Insufficient documentation

## 2014-03-02 DIAGNOSIS — Z7982 Long term (current) use of aspirin: Secondary | ICD-10-CM | POA: Insufficient documentation

## 2014-03-02 DIAGNOSIS — Z5189 Encounter for other specified aftercare: Secondary | ICD-10-CM | POA: Insufficient documentation

## 2014-03-02 DIAGNOSIS — I252 Old myocardial infarction: Secondary | ICD-10-CM | POA: Insufficient documentation

## 2014-03-02 DIAGNOSIS — I2510 Atherosclerotic heart disease of native coronary artery without angina pectoris: Secondary | ICD-10-CM | POA: Insufficient documentation

## 2014-03-04 ENCOUNTER — Encounter (HOSPITAL_COMMUNITY)
Admission: RE | Admit: 2014-03-04 | Discharge: 2014-03-04 | Disposition: A | Discharge status: Home / Self Care | Payer: BC Managed Care – PPO | Source: Ambulatory Visit | Attending: Cardiology | Admitting: Cardiology

## 2014-03-04 LAB — GLUCOSE, CAPILLARY: GLUCOSE-CAPILLARY: 132 mg/dL — AB (ref 70–99)

## 2014-03-07 ENCOUNTER — Encounter (HOSPITAL_COMMUNITY)
Admission: RE | Admit: 2014-03-07 | Discharge: 2014-03-07 | Disposition: A | Discharge status: Home / Self Care | Payer: BC Managed Care – PPO | Source: Ambulatory Visit | Attending: Cardiology | Admitting: Cardiology

## 2014-03-09 ENCOUNTER — Encounter (HOSPITAL_COMMUNITY)
Admission: RE | Admit: 2014-03-09 | Discharge: 2014-03-09 | Disposition: A | Discharge status: Home / Self Care | Payer: BC Managed Care – PPO | Source: Ambulatory Visit | Attending: Cardiology | Admitting: Cardiology

## 2014-03-11 ENCOUNTER — Encounter (HOSPITAL_COMMUNITY)
Admission: RE | Admit: 2014-03-11 | Discharge: 2014-03-11 | Disposition: A | Discharge status: Home / Self Care | Payer: BC Managed Care – PPO | Source: Ambulatory Visit | Attending: Cardiology | Admitting: Cardiology

## 2014-03-11 ENCOUNTER — Telehealth: Payer: Self-pay | Admitting: *Deleted

## 2014-03-11 NOTE — Telephone Encounter (Signed)
PER DR HARDING  IF > 758 mmhg systolic , Back down exercise effort  Decrease  Less than 180 mmhg

## 2014-03-14 ENCOUNTER — Encounter (HOSPITAL_COMMUNITY)
Admission: RE | Admit: 2014-03-14 | Discharge: 2014-03-14 | Disposition: A | Discharge status: Home / Self Care | Payer: BC Managed Care – PPO | Source: Ambulatory Visit | Attending: Cardiology | Admitting: Cardiology

## 2014-03-14 ENCOUNTER — Encounter (HOSPITAL_COMMUNITY): Payer: Self-pay

## 2014-03-14 NOTE — Progress Notes (Signed)
Pt graduated from cardiac rehab program today.  Medication list reconciled.  PHQ9 score-0  .  Pt has made significant lifestyle changes and should be commended for his success. Pt has now gotten glucometer for home use and advised to check cbg once daily, more often prn.  Pt plans to continue exercise on his own walking and may return to cardiac maintenance program in the winter months.

## 2014-03-16 ENCOUNTER — Encounter (HOSPITAL_COMMUNITY): Payer: BC Managed Care – PPO

## 2014-03-18 ENCOUNTER — Encounter (HOSPITAL_COMMUNITY): Payer: BC Managed Care – PPO

## 2014-03-21 ENCOUNTER — Encounter (HOSPITAL_COMMUNITY): Payer: BC Managed Care – PPO

## 2014-03-23 ENCOUNTER — Encounter (HOSPITAL_COMMUNITY): Payer: BC Managed Care – PPO

## 2014-03-25 ENCOUNTER — Encounter (HOSPITAL_COMMUNITY): Payer: BC Managed Care – PPO

## 2014-03-28 ENCOUNTER — Encounter (HOSPITAL_COMMUNITY): Payer: BC Managed Care – PPO

## 2014-03-30 ENCOUNTER — Encounter (HOSPITAL_COMMUNITY): Payer: BC Managed Care – PPO

## 2014-04-01 ENCOUNTER — Encounter (HOSPITAL_COMMUNITY): Payer: BC Managed Care – PPO

## 2014-04-04 ENCOUNTER — Encounter (HOSPITAL_COMMUNITY): Payer: BC Managed Care – PPO

## 2014-04-04 ENCOUNTER — Other Ambulatory Visit: Payer: Self-pay | Admitting: Family Medicine

## 2014-04-06 ENCOUNTER — Encounter (HOSPITAL_COMMUNITY): Payer: BC Managed Care – PPO

## 2014-04-08 ENCOUNTER — Encounter (HOSPITAL_COMMUNITY): Payer: BC Managed Care – PPO

## 2014-04-11 ENCOUNTER — Encounter (HOSPITAL_COMMUNITY): Payer: BC Managed Care – PPO

## 2014-04-13 ENCOUNTER — Encounter (HOSPITAL_COMMUNITY): Payer: BC Managed Care – PPO

## 2014-04-15 ENCOUNTER — Encounter (HOSPITAL_COMMUNITY): Payer: BC Managed Care – PPO

## 2014-04-18 ENCOUNTER — Encounter (HOSPITAL_COMMUNITY): Payer: BC Managed Care – PPO

## 2014-04-20 ENCOUNTER — Encounter (HOSPITAL_COMMUNITY): Payer: BC Managed Care – PPO

## 2014-04-21 ENCOUNTER — Encounter: Payer: Self-pay | Admitting: Cardiology

## 2014-04-21 ENCOUNTER — Ambulatory Visit (INDEPENDENT_AMBULATORY_CARE_PROVIDER_SITE_OTHER): Payer: BC Managed Care – PPO | Admitting: Cardiology

## 2014-04-21 VITALS — BP 110/82 | HR 69 | Ht 70.0 in | Wt 215.6 lb

## 2014-04-21 DIAGNOSIS — I2510 Atherosclerotic heart disease of native coronary artery without angina pectoris: Secondary | ICD-10-CM

## 2014-04-21 DIAGNOSIS — Z9861 Coronary angioplasty status: Secondary | ICD-10-CM

## 2014-04-21 DIAGNOSIS — E782 Mixed hyperlipidemia: Secondary | ICD-10-CM

## 2014-04-21 DIAGNOSIS — I213 ST elevation (STEMI) myocardial infarction of unspecified site: Secondary | ICD-10-CM

## 2014-04-21 DIAGNOSIS — R5381 Other malaise: Secondary | ICD-10-CM

## 2014-04-21 DIAGNOSIS — I219 Acute myocardial infarction, unspecified: Secondary | ICD-10-CM

## 2014-04-21 DIAGNOSIS — R5383 Other fatigue: Secondary | ICD-10-CM

## 2014-04-21 DIAGNOSIS — Z955 Presence of coronary angioplasty implant and graft: Secondary | ICD-10-CM

## 2014-04-21 DIAGNOSIS — I10 Essential (primary) hypertension: Secondary | ICD-10-CM

## 2014-04-21 DIAGNOSIS — E1149 Type 2 diabetes mellitus with other diabetic neurological complication: Secondary | ICD-10-CM

## 2014-04-21 MED ORDER — LISINOPRIL 2.5 MG PO TABS: 2.5000 mg | ORAL_TABLET | Freq: Every day | ORAL | Status: DC

## 2014-04-21 MED ORDER — ATORVASTATIN CALCIUM 40 MG PO TABS: 40.0000 mg | ORAL_TABLET | Freq: Every day | ORAL | Status: DC

## 2014-04-21 MED ORDER — FENOFIBRATE 160 MG PO TABS: 160.0000 mg | ORAL_TABLET | Freq: Every day | ORAL | Status: DC

## 2014-04-21 MED ORDER — METOPROLOL TARTRATE 25 MG PO TABS: 12.5000 mg | ORAL_TABLET | Freq: Two times a day (BID) | ORAL | Status: DC

## 2014-04-21 NOTE — Patient Instructions (Signed)
Your physician recommends that you schedule a follow-up appointment in: 6 months  Your physician has recommended you make the following change in your medication: Decrease lipitor from 80 mg to 40 mg daily

## 2014-04-22 ENCOUNTER — Encounter (HOSPITAL_COMMUNITY): Payer: BC Managed Care – PPO

## 2014-04-24 DIAGNOSIS — R5383 Other fatigue: Secondary | ICD-10-CM | POA: Insufficient documentation

## 2014-04-24 NOTE — Assessment & Plan Note (Signed)
Excellent control. Need to monitor when the back of his beta blocker, may need increase lisinopril to 5 mg.

## 2014-04-24 NOTE — Assessment & Plan Note (Signed)
I'm not sure why he is feeling tired, his EF is normal. Backing off his Lipitor to 40 mg and his metoprolol 12 twice a day.

## 2014-04-24 NOTE — Assessment & Plan Note (Signed)
A1c is much better from his March labs. LDL now back to 81 with HDL 36 total of 198. It was transferred down to 403 from 1262.  Continue fenofibrate and statin. Also omega-3 fatty acids. Continue diet and exercise.

## 2014-04-24 NOTE — Assessment & Plan Note (Signed)
No anginal symptoms. No heart failure symptoms. The only thing he notices that he tires easily and does not feel as good as he would expect to feel. He is on aspirin +1 day without problem. On high-dose statin which will reduce to 40 mg. He is on lisinopril 2.5 and mgmetoprolol 25 twice a day which I will decrease to 12/2 twice a day.

## 2014-04-24 NOTE — Assessment & Plan Note (Signed)
On aspirin plus Brilinta - no bleeding complications he says mild nosebleeds spontaneously resolved.

## 2014-04-24 NOTE — Progress Notes (Signed)
PATIENT: [name] [name] MRN: [id number]  DOB: 01-11-1961   DOV:04/24/2014 PCP: Elsie Stain, MD  Clinic Note: Chief Complaint  Patient presents with  . 4 MONTH VISIT     NO CHEST PAIN ,  SOB WHEN WORKING,BECOME TIRED EASILY DO NOT HAVE THE STAMINA , NO EDEMA, COMPLETED CARDIAC REHAB   HPI: [name] is a 53 y.o.  male with a PMH below who presents today for his 3rd followup after his inferior STEMI on January 2nd -- PI to RCA with a Xience DES stent.  I last saw him in February and he was doing well - starting Cardiac Rehab.  Interval History: He overall is doing fairly well, however he does notice he is tired more easily and gets winded when walking. However then when you ask him he walks 2 miles involving his knees. He did fine with cardiac rehabilitation. He denies any chest tightness or pressure with rest or exertion. No PND, orthopnea or edema. No palpitations, rapid heart beats or worsens. No syncope/near-syncope TIA/amaurosis fugax. No melena, hematochezia hematuria. However he has had some intermittent mild nosebleeds when his allergies were acting up. No claudication.  Past Medical History  Diagnosis Date  . Allergic rhinitis   . Fracture of right hand 1983  . Paresthesia     tingling in feet  . Hyperlipidemia   . Nocturia   . Hypertension   . Diabetes mellitus     type2  . STEMI (ST elevation myocardial infarction) of inf. wall 12/03/2013  . CAD S/P percutaneous coronary angioplasty 12/03/2013    PCI-RCA - Xience Alpine DES 3.0 mm x 18 mm --> 3.5 mm    Prior Cardiac Evaluation and Past Surgical History: Past Surgical History  Procedure Laterality Date  . Septoplasty  1970    due to Fx Nose  . Knee surgery      B arthroscopic  . Percutaneous coronary stent intervention (pci-s)  12/03/2013    mRCA - Xience Alpine DES 3.0 mm x 18 mm --> 3.5 mm  . Transthoracic echocardiogram  12/03/2013    Mild Conc LVH; EF 45-50%, Mild Inferior HK. Grade 1 DD     Allergies  Allergen Reactions  . Metformin And Related     Intolerant of more than 1000mg  a day    Current Outpatient Prescriptions  Medication Sig Dispense Refill  . aspirin EC 81 MG tablet Take 1 tablet (81 mg total) by mouth daily.      Marland Kitchen BRILINTA 90 MG TABS tablet TAKE 1 TABLET BY MOUTH TWICE A DAY  60 tablet  11  . fenofibrate 160 MG tablet Take 1 tablet (160 mg total) by mouth daily.  90 tablet  3  . glipiZIDE (GLUCOTROL) 5 MG tablet Take 1 tablet (5 mg total) by mouth daily before breakfast.  90 tablet  3  . l-methylfolate-B6-B12 (METANX) 3-35-2 MG TABS Take 1 tablet by mouth 2 (two) times daily. [employer] states Rx no longer available in tablet form, capsules authorized.  180 tablet  3  . lisinopril (ZESTRIL) 2.5 MG tablet Take 1 tablet (2.5 mg total) by mouth daily.  90 tablet  3  . metFORMIN (GLUCOPHAGE) 500 MG tablet Take 1 tablet (500 mg total) by mouth 2 (two) times daily with a meal.  180 tablet  3  . metoprolol tartrate (LOPRESSOR) 25 MG tablet Take 0.5 tablets (12.5 mg total) by mouth 2 (two) times daily.  90 tablet  3  . Multiple Vitamin (MULTIVITAMIN  WITH MINERALS) TABS tablet Take 1 tablet by mouth daily.  30 tablet  6  . nitroGLYCERIN (NITROSTAT) 0.4 MG SL tablet Place 1 tablet (0.4 mg total) under the tongue every 5 (five) minutes x 3 doses as needed for chest pain.  25 tablet  0  . OMEGA-3 KRILL OIL 300 MG CAPS Take by mouth daily.       Marland Kitchen SAFETY SEAL LANCETS MISC 1 cartridge by Does not apply route as directed.  1 each  5  . atorvastatin (LIPITOR) 40 MG tablet Take 1 tablet (40 mg total) by mouth daily.  90 tablet  3   No current facility-administered medications for this visit.    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   Forward 10 pack year smoker, who quit on the day of his MI.          ROS: A comprehensive Review of Systems - was negative with exception of  symptoms noted above and knee pain  PHYSICAL EXAM BP 110/82  Pulse 69  Ht 5\' 10"  (1.778 m)  Wt 215 lb 9.6 oz (97.796 kg)  BMI 30.94 kg/m2 General appearance:  Alert and oriented X 3,cooperative, appears stated age, no distress and Well-nourished/well-groomed. Neck: no adenopathy, no carotid bruit, no JVD and supple, symmetrical, trachea midline Lungs: clear to auscultation bilaterally, normal percussion bilaterally and Nonlabored, good air movement Heart: regular rate and rhythm, S1, S2 normal, no murmur, click, rub or gallop and normal apical impulse Abdomen: soft, non-tender; bowel sounds normal; no masses,  no organomegaly Extremities: extremities normal, atraumatic, no cyanosis or edema Pulses: 2+ and symmetric Neurologic: normal strength and tone. Normal symmetric reflexes. Normal coordination and gait  PYP:[id number] today: Yes - no significant change Rate:  69  , Rhythm: NSR; Inferior MI, age undetermined  Recent Labs: None Since last visit.  ASSESSMENT / PLAN: CAD S/P percutaneous coronary angioplasty - Promus Premier DES 3.0 mm x 18 mm (3.5 mm) to mid RCA occlusion; no significant left-sided disease. No anginal symptoms. No heart failure symptoms. The only thing he notices that he tires easily and does not feel as good as he would expect to feel. He is on aspirin +1 day without problem. On high-dose statin which will reduce to 40 mg. He is on lisinopril 2.5 and mgmetoprolol 25 twice a day which I will decrease to 12/2 twice a day.  Fatigue I'm not sure why he is feeling tired, his EF is normal. Backing off his Lipitor to 40 mg and his metoprolol 12 twice a day.  HYPERLIPIDEMIA, MIXED -- significant hypertriglyceridemia A1c is much better from his March labs. LDL now back to 81 with HDL 36 total of 198. It was transferred down to 403 from 1262.  Continue fenofibrate and statin. Also omega-3 fatty acids. Continue diet and exercise.  Presence of drug coated stent in right  coronary artery: Promus Premier DES 3.0 mm x 18 mm (3.5 mm) to mid RCA occlusion 12/03/12 On aspirin plus Brilinta - no bleeding complications he says mild nosebleeds spontaneously resolved.  Essential hypertension Excellent control. Need to monitor when the back of his beta blocker, may need increase lisinopril to 5 mg.    Orders Placed This Encounter  Procedures  . EKG 12-Lead   Meds ordered this encounter  Medications  . fenofibrate 160 MG tablet    Sig: Take 1 tablet (160 mg total)  by mouth daily.    Dispense:  90 tablet    Refill:  3    Order Specific Question:  Supervising Provider    Answer:  Leona Carry  . lisinopril (ZESTRIL) 2.5 MG tablet    Sig: Take 1 tablet (2.5 mg total) by mouth daily.    Dispense:  90 tablet    Refill:  3  . metoprolol tartrate (LOPRESSOR) 25 MG tablet    Sig: Take 0.5 tablets (12.5 mg total) by mouth 2 (two) times daily.    Dispense:  90 tablet    Refill:  3    Order Specific Question:  Supervising Provider    Answer:  Leona Carry  . DISCONTD: atorvastatin (LIPITOR) 80 MG tablet    Sig: Take 40 mg by mouth daily at 6 PM.  . atorvastatin (LIPITOR) 40 MG tablet    Sig: Take 1 tablet (40 mg total) by mouth daily.    Dispense:  90 tablet    Refill:  3    Followup:6  months  DAVID W. Ellyn Hack, M.D., M.S. Interventional Cardiology [facility]

## 2014-04-26 ENCOUNTER — Ambulatory Visit: Payer: BC Managed Care – PPO | Admitting: Cardiology

## 2014-05-10 ENCOUNTER — Other Ambulatory Visit (INDEPENDENT_AMBULATORY_CARE_PROVIDER_SITE_OTHER): Payer: BC Managed Care – PPO

## 2014-05-10 DIAGNOSIS — E1149 Type 2 diabetes mellitus with other diabetic neurological complication: Secondary | ICD-10-CM

## 2014-05-10 DIAGNOSIS — I10 Essential (primary) hypertension: Secondary | ICD-10-CM

## 2014-05-10 DIAGNOSIS — E119 Type 2 diabetes mellitus without complications: Secondary | ICD-10-CM

## 2014-05-10 DIAGNOSIS — E782 Mixed hyperlipidemia: Secondary | ICD-10-CM

## 2014-05-10 LAB — LIPID PANEL
CHOLESTEROL: 195 mg/dL (ref 0–200)
HDL: 37.3 mg/dL — AB (ref >39.00)
LDL CALC: 86 mg/dL (ref 0–99)
NonHDL: 157.7
TRIGLYCERIDES: 361 mg/dL — AB (ref 0.0–149.0)
Total CHOL/HDL Ratio: 5
VLDL: 72.2 mg/dL — ABNORMAL HIGH (ref 0.0–40.0)

## 2014-05-10 LAB — HEMOGLOBIN A1C: HEMOGLOBIN A1C: 7.1 % — AB (ref 4.6–6.5)

## 2014-05-12 ENCOUNTER — Ambulatory Visit (INDEPENDENT_AMBULATORY_CARE_PROVIDER_SITE_OTHER): Payer: BC Managed Care – PPO | Admitting: Family Medicine

## 2014-05-12 ENCOUNTER — Encounter: Payer: Self-pay | Admitting: Family Medicine

## 2014-05-12 VITALS — BP 116/74 | HR 63 | Temp 97.8°F | Ht 70.0 in | Wt 212.2 lb

## 2014-05-12 DIAGNOSIS — E782 Mixed hyperlipidemia: Secondary | ICD-10-CM

## 2014-05-12 DIAGNOSIS — E1149 Type 2 diabetes mellitus with other diabetic neurological complication: Secondary | ICD-10-CM

## 2014-05-12 NOTE — Patient Instructions (Signed)
Schedule a physical for about 09/2014 with labs ahead of time.  Cut back on sodas and try to keep exercising. Glad to see you.

## 2014-05-12 NOTE — Progress Notes (Signed)
Pre visit review using our clinic review tool, if applicable. No additional management support is needed unless otherwise documented below in the visit note.  Diabetes:  Using medications without difficulties: yes Hypoglycemic episodes:no Hyperglycemic episodes:no Feet problems: at baseline, not changed from prev Blood Sugars averaging: 120-140s eye exam within last year: yes  Elevated Cholesterol: Using medications without problems: yes, see below Muscle aches: no Diet compliance: yes Exercise: walking until the last week (schedule and weather changes) His weight is up and down.  D/w pt about diet and exercise routine.   He still has some fatigue.  Persistent tiredness.  He is going to cut back from 80 to 40mg  of lipitor to see if that helps- he had d/w cards prev.   Meds, vitals, and allergies reviewed.   ROS: See HPI.  Otherwise negative.    GEN: nad, alert and oriented HEENT: mucous membranes moist NECK: supple w/o LA CV: rrr. PULM: ctab, no inc wob ABD: soft, +bs EXT: no edema SKIN: no acute rash

## 2014-05-13 NOTE — Assessment & Plan Note (Signed)
A1c improved, he'll continues to work on weight and diet. Recheck at CPE later in 2015. He agrees.

## 2014-05-13 NOTE — Assessment & Plan Note (Signed)
He'll try the lower dose of statin to see if he feels better.

## 2014-05-19 ENCOUNTER — Other Ambulatory Visit: Payer: Self-pay

## 2014-05-19 MED ORDER — METFORMIN HCL 500 MG PO TABS
500.0000 mg | ORAL_TABLET | Freq: Two times a day (BID) | ORAL | Status: DC
Start: 2014-05-19 — End: 2014-09-02

## 2014-05-19 NOTE — Telephone Encounter (Signed)
[name] [name] has requested refill from mail order but pt will be out of med prior to receiving mail order rx. pts wife request # [phone] of metformin to [facility]. Advised done.

## 2014-05-31 ENCOUNTER — Encounter: Payer: Self-pay | Admitting: Cardiology

## 2014-06-08 ENCOUNTER — Encounter: Payer: Self-pay | Admitting: Adult Health

## 2014-06-08 ENCOUNTER — Ambulatory Visit (INDEPENDENT_AMBULATORY_CARE_PROVIDER_SITE_OTHER): Payer: BC Managed Care – PPO | Admitting: Adult Health

## 2014-06-08 VITALS — BP 120/68 | HR 87 | Temp 98.3°F | Resp 16 | Ht 70.0 in | Wt 212.5 lb

## 2014-06-08 DIAGNOSIS — R21 Rash and other nonspecific skin eruption: Secondary | ICD-10-CM

## 2014-06-08 MED ORDER — DOXYCYCLINE HYCLATE 100 MG PO TABS: 100.0000 mg | ORAL_TABLET | Freq: Two times a day (BID) | ORAL | Status: DC

## 2014-06-08 NOTE — Progress Notes (Signed)
Pre visit review using our clinic review tool, if applicable. No additional management support is needed unless otherwise documented below in the visit note. 

## 2014-06-08 NOTE — Patient Instructions (Signed)
  Start Doxycycline 1 capsule twice a day for 10 days.  Follow up with your provider if your symptoms do not improve.  Keep the area clean and dry. You can also apply neosporin.

## 2014-06-08 NOTE — Progress Notes (Signed)
Patient ID: [name] [name], male   DOB: [birth year], 53 y.o.   MRN: [id number]   Subjective:    Patient ID: [name] [name], male    DOB: 24-Apr-1961, 53 y.o.   MRN: [id number]  HPI  Pt is a pleasant 53 y/o male who presents to clinic with a reddened area on his 2nd toe on left foot. He reports that he was at [facility] this past weekend. He felt something crawling on him but did not actually see anything. He noticed the reddened and swollen area this past Friday. Denies pain, itching, fever. He reports the area is improved. At first he thought it was a "blood blister" but this never developed.     Past Medical History  Diagnosis Date  . Allergic rhinitis   . Fracture of right hand 1983  . Paresthesia     tingling in feet  . Hyperlipidemia   . Nocturia   . Hypertension   . Diabetes mellitus     type2  . STEMI (ST elevation myocardial infarction) of inf. wall 12/03/2013  . CAD S/P percutaneous coronary angioplasty 12/03/2013    PCI-RCA - Xience Alpine DES 3.0 mm x 18 mm --> 3.5 mm    Current Outpatient Prescriptions on File Prior to Visit  Medication Sig Dispense Refill  . aspirin EC 81 MG tablet Take 1 tablet (81 mg total) by mouth daily.      Marland Kitchen atorvastatin (LIPITOR) 40 MG tablet Take 1 tablet (40 mg total) by mouth daily.  90 tablet  3  . BRILINTA 90 MG TABS tablet TAKE 1 TABLET BY MOUTH TWICE A DAY  60 tablet  11  . fenofibrate 160 MG tablet Take 1 tablet (160 mg total) by mouth daily.  90 tablet  3  . glipiZIDE (GLUCOTROL) 5 MG tablet Take 1 tablet (5 mg total) by mouth daily before breakfast.  90 tablet  3  . l-methylfolate-B6-B12 (METANX) 3-35-2 MG TABS Take 1 tablet by mouth 2 (two) times daily. [employer] states Rx no longer available in tablet form, capsules authorized.  180 tablet  3  . lisinopril (ZESTRIL) 2.5 MG tablet Take 1 tablet (2.5 mg total) by mouth daily.  90 tablet  3  . metFORMIN (GLUCOPHAGE) 500 MG tablet Take 1 tablet (500 mg total) by mouth 2  (two) times daily with a meal.  30 tablet  0  . metoprolol tartrate (LOPRESSOR) 25 MG tablet Take 0.5 tablets (12.5 mg total) by mouth 2 (two) times daily.  90 tablet  3  . Multiple Vitamin (MULTIVITAMIN WITH MINERALS) TABS tablet Take 1 tablet by mouth daily.  30 tablet  6  . nitroGLYCERIN (NITROSTAT) 0.4 MG SL tablet Place 1 tablet (0.4 mg total) under the tongue every 5 (five) minutes x 3 doses as needed for chest pain.  25 tablet  0  . OMEGA-3 KRILL OIL 300 MG CAPS Take by mouth daily.       Marland Kitchen SAFETY SEAL LANCETS MISC 1 cartridge by Does not apply route as directed.  1 each  5   No current facility-administered medications on file prior to visit.    Review of Systems  Skin: Positive for rash.       2nd toe on left foot with redness and swelling.  All other systems reviewed and are negative.      Objective:  BP 120/68  Pulse 87  Temp(Src) 98.3 F (36.8 C) (Oral)  Resp 16  Ht 5\' 10"  (1.778  m)  Wt 212 lb 8 oz (96.389 kg)  BMI 30.49 kg/m2  SpO2 96%   Physical Exam  Constitutional: He is oriented to person, place, and time. He appears well-developed and well-nourished. No distress.  HENT:  Head: Normocephalic and atraumatic.  Eyes: Conjunctivae and EOM are normal.  Neck: Neck supple.  Cardiovascular: Normal rate and regular rhythm.   Pulmonary/Chest: Effort normal. No respiratory distress.  Musculoskeletal: Normal range of motion.  Neurological: He is alert and oriented to person, place, and time. Coordination normal.  Skin: Skin is warm and dry. Rash noted.  Area on 2nd toe of the left foot with erythema. There is no blistering. The remaining skin on the toe as well as toenail is intact.   Psychiatric: He has a normal mood and affect. His behavior is normal. Judgment and thought content normal.      Assessment & Plan:   1. Rash and nonspecific skin eruption Approximate 2 cm area right below the cuticle line of the 2nd toe on the left foot with macular area. It is  erythematous. No broken skin. Suspect insect bite. Start doxycycline 100 mg bid x 10 days. I have asked pt to monitor the area for any sign of worsening. Keep the area clean and dry. May apply neosporin or polysporin to the area. RTC if symptoms do not improve within 3-4 days or sooner if necessary.

## 2014-06-10 ENCOUNTER — Ambulatory Visit: Payer: BC Managed Care – PPO | Admitting: Family Medicine

## 2014-06-28 ENCOUNTER — Other Ambulatory Visit: Payer: Self-pay | Admitting: *Deleted

## 2014-07-01 ENCOUNTER — Other Ambulatory Visit: Payer: Self-pay | Admitting: *Deleted

## 2014-07-08 ENCOUNTER — Telehealth: Payer: Self-pay

## 2014-07-08 NOTE — Telephone Encounter (Signed)
Pt got note from [facility] that Cecilie Kicks NP has not responded to refill request for folic acid. Pt is not sure who Cecilie Kicks NP is or what office she is affilitated with. Pt requeset Dr Damita Dunnings to refill folic acid. Historical list for folic acid said to stop upon discharge. Pt is presently taking folic acid 1 mg pt taking one tab twice a day. Pt request cb.

## 2014-07-10 MED ORDER — L-METHYLFOLATE-B6-B12 3-35-2 MG PO TABS: 1.0000 | ORAL_TABLET | Freq: Two times a day (BID) | ORAL | Status: DC

## 2014-07-10 NOTE — Telephone Encounter (Signed)
Sent!

## 2014-08-29 LAB — HM DIABETES EYE EXAM

## 2014-09-02 ENCOUNTER — Other Ambulatory Visit: Payer: Self-pay | Admitting: *Deleted

## 2014-09-02 MED ORDER — METFORMIN HCL 500 MG PO TABS: 500.0000 mg | ORAL_TABLET | Freq: Two times a day (BID) | ORAL | Status: DC

## 2014-09-02 NOTE — Telephone Encounter (Signed)
Okay, sent. Thanks.

## 2014-09-02 NOTE — Telephone Encounter (Signed)
Received faxed refill request from pharmacy. See allergy/contrainidcation. Is it okay to refill medication?

## 2014-09-21 ENCOUNTER — Other Ambulatory Visit: Payer: Self-pay | Admitting: Family Medicine

## 2014-09-21 DIAGNOSIS — E1149 Type 2 diabetes mellitus with other diabetic neurological complication: Secondary | ICD-10-CM

## 2014-09-26 ENCOUNTER — Other Ambulatory Visit (INDEPENDENT_AMBULATORY_CARE_PROVIDER_SITE_OTHER): Payer: BC Managed Care – PPO

## 2014-09-26 DIAGNOSIS — I10 Essential (primary) hypertension: Secondary | ICD-10-CM

## 2014-09-26 DIAGNOSIS — Z0000 Encounter for general adult medical examination without abnormal findings: Secondary | ICD-10-CM

## 2014-09-26 DIAGNOSIS — E1149 Type 2 diabetes mellitus with other diabetic neurological complication: Secondary | ICD-10-CM

## 2014-09-26 DIAGNOSIS — E1140 Type 2 diabetes mellitus with diabetic neuropathy, unspecified: Secondary | ICD-10-CM

## 2014-09-26 DIAGNOSIS — R5383 Other fatigue: Secondary | ICD-10-CM

## 2014-09-26 LAB — LDL CHOLESTEROL, DIRECT: Direct LDL: 95.9 mg/dL

## 2014-09-26 LAB — COMPREHENSIVE METABOLIC PANEL
ALT: 29 U/L (ref 0–53)
AST: 23 U/L (ref 0–37)
Albumin: 3.8 g/dL (ref 3.5–5.2)
Alkaline Phosphatase: 36 U/L — ABNORMAL LOW (ref 39–117)
BILIRUBIN TOTAL: 0.5 mg/dL (ref 0.2–1.2)
BUN: 14 mg/dL (ref 6–23)
CHLORIDE: 103 meq/L (ref 96–112)
CO2: 24 mEq/L (ref 19–32)
CREATININE: 0.9 mg/dL (ref 0.4–1.5)
Calcium: 9.5 mg/dL (ref 8.4–10.5)
GFR: 89.7 mL/min (ref >60.00)
GLUCOSE: 132 mg/dL — AB (ref 70–99)
Potassium: 4.7 mEq/L (ref 3.5–5.1)
Sodium: 135 mEq/L (ref 135–145)
Total Protein: 7.4 g/dL (ref 6.0–8.3)

## 2014-09-26 LAB — LIPID PANEL
CHOLESTEROL: 205 mg/dL — AB (ref 0–200)
HDL: 29.1 mg/dL — AB (ref >39.00)
NonHDL: 175.9
Total CHOL/HDL Ratio: 7
Triglycerides: 557 mg/dL — ABNORMAL HIGH (ref 0.0–149.0)
VLDL: 111.4 mg/dL — AB (ref 0.0–40.0)

## 2014-09-26 LAB — HEMOGLOBIN A1C: Hgb A1c MFr Bld: 7 % — ABNORMAL HIGH (ref 4.6–6.5)

## 2014-09-28 ENCOUNTER — Encounter: Payer: Self-pay | Admitting: Family Medicine

## 2014-09-28 ENCOUNTER — Ambulatory Visit (INDEPENDENT_AMBULATORY_CARE_PROVIDER_SITE_OTHER): Payer: BC Managed Care – PPO | Admitting: Family Medicine

## 2014-09-28 VITALS — BP 130/80 | HR 72 | Temp 98.4°F | Ht 70.0 in | Wt 210.5 lb

## 2014-09-28 DIAGNOSIS — E118 Type 2 diabetes mellitus with unspecified complications: Secondary | ICD-10-CM

## 2014-09-28 DIAGNOSIS — I10 Essential (primary) hypertension: Secondary | ICD-10-CM

## 2014-09-28 DIAGNOSIS — E782 Mixed hyperlipidemia: Secondary | ICD-10-CM

## 2014-09-28 DIAGNOSIS — Z0000 Encounter for general adult medical examination without abnormal findings: Secondary | ICD-10-CM

## 2014-09-28 DIAGNOSIS — Z7189 Other specified counseling: Secondary | ICD-10-CM

## 2014-09-28 DIAGNOSIS — Z23 Encounter for immunization: Secondary | ICD-10-CM

## 2014-09-28 DIAGNOSIS — E1149 Type 2 diabetes mellitus with other diabetic neurological complication: Secondary | ICD-10-CM

## 2014-09-28 MED ORDER — VITAMIN B-12 1000 MCG PO TABS: 1000.0000 ug | ORAL_TABLET | Freq: Every day | ORAL | Status: AC

## 2014-09-28 MED ORDER — VITAMIN B-6 25 MG PO TABS: 25.0000 mg | ORAL_TABLET | Freq: Every day | ORAL | Status: AC

## 2014-09-28 MED ORDER — FOLIC ACID 400 MCG PO TABS: 400.0000 ug | ORAL_TABLET | Freq: Every day | ORAL | Status: DC

## 2014-09-28 NOTE — Assessment & Plan Note (Signed)
Routine anticipatory guidance given to patient.  See health maintenance. Tetanus due.  D/w pt.  Flu shot today.  PNA prev done.  Shingles due at 62.  Prostate cancer screening and PSA options (with potential risks and benefits of testing vs not testing) were discussed along with recent recs/guidelines.  He declined testing PSA at this point. Colonoscopy 2011 Living will d/w pt.  Wife designated if patient were incapacitated.   Diet and exercise d/w pt.  Encouraged on both. He joined [employer] and was on a good routine until vacation.  He fell out of the routine.

## 2014-09-28 NOTE — Progress Notes (Signed)
Pre visit review using our clinic review tool, if applicable. No additional management support is needed unless otherwise documented below in the visit note.  CPE- See plan.  Routine anticipatory guidance given to patient.  See health maintenance. Tetanus due.  D/w pt.  Flu shot today.  PNA prev done.  Shingles due at 43.  Prostate cancer screening and PSA options (with potential risks and benefits of testing vs not testing) were discussed along with recent recs/guidelines.  He declined testing PSA at this point. Colonoscopy 2011 Living will d/w pt.  Wife designated if patient were incapacitated.   Diet and exercise d/w pt.  Encouraged on both. He joined [employer] and was on a good routine until vacation.  He fell out of the routine.    Diabetes:  Using medications without difficulties:yes Hypoglycemic episodes:not checked recently, no sx Hyperglycemic episodes: no sx Feet problems: tingling and pain at baseline, had gotten some better on metanx prev, but couldn't get that filled recently.  Blood Sugars averaging: not checked.  eye exam within last year: 1 month ago.  He had some blurry vision after the last eye exam, unclear source for the change.  He has f/u with a second opinion pending.  No retinopathy per patient report.   Hypertension:    Using medication without problems or lightheadedness: yes Chest pain with exertion:no Edema:no Short of breath: occ, likely from deconditioning recently.   Elevated Cholesterol: Using medications without problems:yes Muscle aches: no Diet compliance:yes  Exercise:see above  PMH and SH reviewed  ROS: See HPI, otherwise noncontributory.  Meds, vitals, and allergies reviewed.   GEN: nad, alert and oriented HEENT: mucous membranes moist NECK: supple w/o LA CV: rrr.  PULM: ctab, no inc wob ABD: soft, +bs EXT: no edema SKIN: no acute rash  Diabetic foot exam: Normal inspection No skin breakdown No calluses  Normal DP  pulses Normal sensation to light touch and monofilament Nails normal

## 2014-09-28 NOTE — Assessment & Plan Note (Signed)
TG up with sugar, d/w pt. He'll get back in the gym, recheck A1c and lipids in 3 months.  He agrees.  No changes in meds today.

## 2014-09-28 NOTE — Patient Instructions (Addendum)
Vary the time you check your sugar, fasting in the AM, before and then two hours after meals.   Get back in the gym.  Recheck A1c and lipids in about 3 months before a visit.  Fasting lab visit.  Try the OTC B12, B6, and folate.  Take one of each a day.  Take care. Glad to see you.

## 2014-09-28 NOTE — Assessment & Plan Note (Addendum)
Normal exam today, but still with foot tingling and pain.  Restart folate/B6/B12 to see if that helps at al.  TG up with sugar, d/w pt. He'll get back in the gym, recheck A1c and lipids in 3 months.  He agrees.  No changes in meds today o/w.  Correction 09/29/14. There was a name alert issue on this encounter.  His father's labs (similar name) was printed and given to patient.  I talked to patient about this, it was corrected and patient is aware.  We discussed his correct labs.  His father is also aware (there was no error with his father's encounter).  This issue appears resolved. I apologized to both patient and his father.

## 2014-09-28 NOTE — Assessment & Plan Note (Signed)
Controlled.  He'll get back in the gym, he'll continue work on diet.  No changes in meds today.

## 2014-09-29 ENCOUNTER — Encounter: Payer: Self-pay | Admitting: Family Medicine

## 2014-10-25 ENCOUNTER — Ambulatory Visit (INDEPENDENT_AMBULATORY_CARE_PROVIDER_SITE_OTHER): Payer: BC Managed Care – PPO | Admitting: Podiatry

## 2014-10-25 ENCOUNTER — Encounter: Payer: Self-pay | Admitting: Podiatry

## 2014-10-25 VITALS — BP 127/85 | HR 82 | Resp 16

## 2014-10-25 DIAGNOSIS — M722 Plantar fascial fibromatosis: Secondary | ICD-10-CM

## 2014-10-25 DIAGNOSIS — S92912A Unspecified fracture of left toe(s), initial encounter for closed fracture: Secondary | ICD-10-CM

## 2014-10-25 NOTE — Progress Notes (Signed)
He presents today stating that his feet become painful as times gone on and he is requesting a new pair of his orthotics. He denies changes in his past medical history medications allergy surgery and social history.  Objective: Vital signs are stable he is alert and oriented 3. Mild hammertoe deformities bilateral mild tenderness on palpation me continue tubercles bilateral.  Assessment: Hammertoe deformities for her fasciitis bilateral.  Plan: He was scanned for set of orthotics today.

## 2014-11-01 HISTORY — PX: TRANSTHORACIC ECHOCARDIOGRAM: SHX275

## 2014-11-10 ENCOUNTER — Encounter (HOSPITAL_COMMUNITY): Payer: Self-pay | Admitting: Cardiology

## 2014-11-22 ENCOUNTER — Ambulatory Visit (INDEPENDENT_AMBULATORY_CARE_PROVIDER_SITE_OTHER): Payer: BC Managed Care – PPO | Admitting: Cardiology

## 2014-11-22 ENCOUNTER — Encounter: Payer: Self-pay | Admitting: Cardiology

## 2014-11-22 VITALS — BP 104/70 | HR 68 | Ht 70.0 in | Wt 214.2 lb

## 2014-11-22 DIAGNOSIS — E1149 Type 2 diabetes mellitus with other diabetic neurological complication: Secondary | ICD-10-CM

## 2014-11-22 DIAGNOSIS — I2510 Atherosclerotic heart disease of native coronary artery without angina pectoris: Secondary | ICD-10-CM

## 2014-11-22 DIAGNOSIS — I10 Essential (primary) hypertension: Secondary | ICD-10-CM

## 2014-11-22 DIAGNOSIS — E1140 Type 2 diabetes mellitus with diabetic neuropathy, unspecified: Secondary | ICD-10-CM

## 2014-11-22 DIAGNOSIS — E782 Mixed hyperlipidemia: Secondary | ICD-10-CM

## 2014-11-22 DIAGNOSIS — I2119 ST elevation (STEMI) myocardial infarction involving other coronary artery of inferior wall: Secondary | ICD-10-CM

## 2014-11-22 DIAGNOSIS — R5383 Other fatigue: Secondary | ICD-10-CM

## 2014-11-22 DIAGNOSIS — Z9861 Coronary angioplasty status: Secondary | ICD-10-CM

## 2014-11-22 NOTE — Assessment & Plan Note (Signed)
On Metformin & Glipizide --> monitored by PCP.

## 2014-11-22 NOTE — Assessment & Plan Note (Signed)
Doing well.  ON DAPT still.   On BB, ACE-I as well as stating & fibrate.

## 2014-11-22 NOTE — Assessment & Plan Note (Signed)
Lab Results  Component Value Date   CHOL 205* 09/26/2014   HDL 29.10* 09/26/2014   LDLCALC 86 05/10/2014   LDLDIRECT 95.9 09/26/2014   TRIG 557.0* 09/26/2014   CHOLHDL 7 09/26/2014   Plan per PCP is to recheck in January - Unexpected increase in TG with fibrate.  Consider Omega 3 FA (Lovaza)

## 2014-11-22 NOTE — Patient Instructions (Signed)
Your physician has requested that you have an echocardiogram. Echocardiography is a painless test that uses sound waves to create images of your heart. It provides your doctor with information about the size and shape of your heart and how well your heart's chambers and valves are working. This procedure takes approximately one hour. There are no restrictions for this procedure.   No change in medications   Your physician wants you to follow-up in 12 months Dr Ellyn Hack.  You will receive a reminder letter in the mail two months in advance. If you don't receive a letter, please call our office to schedule the follow-up appointment.

## 2014-11-22 NOTE — Progress Notes (Signed)
PATIENT: [name] [name] MRN: [id number]  DOB: 1960/12/27   DOV:11/22/2014 PCP: Elsie Stain, MD  Clinic Note: Chief Complaint  Patient presents with  . Follow-up    1 yr visit. pt denies chest pain, sob, and swelling   HPI: [name] [name] is a 53 y.o.  male with a PMH below who presents today for his 3rd followup after his inferior STEMI on January 2nd -- PI to RCA with a Xience DES stent.  I last saw him in May & he mostly noted easy fatigue.  He had lipids checked in October that showed worsening Triglycerides (seen below).    Interval History: He is doing very well with no complaints since his last visit.  He was seeing a podiatrist for an issue with his foot/toe, but notes that this is better.  He stopped going to the gym after his vacation in July. He has remained active. He denies any resting or exertional chest tightness or pressure. His fatigue is improved. No PND orthopnea or edema. No palpitations, rapid heart beats or worsens. No syncope/near-syncope TIA/amaurosis fugax. No melena, hematochezia hematuria. However he has had some intermittent mild nosebleeds when his allergies were acting up. No claudication.  Past Medical History  Diagnosis Date  . Allergic rhinitis   . Fracture of right hand 1983  . Paresthesia     tingling in feet  . Hyperlipidemia   . Nocturia   . Hypertension   . Diabetes mellitus     type2  . STEMI (ST elevation myocardial infarction) of inf. wall 12/03/2013  . CAD S/P percutaneous coronary angioplasty 12/03/2013    PCI-RCA - Xience Alpine DES 3.0 mm x 18 mm --> 3.5 mm; Echo 1/'15" EF 45-50%, mild C LVH, mild Inf HK, Gr 1 DD    Prior Cardiac Evaluation and Past Surgical History: Past Surgical History  Procedure Laterality Date  . Septoplasty  1970    due to Fx Nose  . Knee surgery      B arthroscopic  . Percutaneous coronary stent intervention (pci-s)  12/03/2013    mRCA - Xience Alpine DES 3.0 mm x 18 mm --> 3.5 mm  . Transthoracic  echocardiogram  12/03/2013    Mild Conc LVH; EF 45-50%, Mild Inferior HK. Grade 1 DD  . Left heart catheterization with coronary angiogram N/A 12/03/2013    Procedure: LEFT HEART CATHETERIZATION WITH CORONARY ANGIOGRAM;  Surgeon: Leonie Man, MD;  Location: [facility] CATH LAB;  Service: Cardiovascular;  Laterality: N/A;    Allergies  Allergen Reactions  . Metformin And Related     Intolerant of more than 1000mg  a day    Current Outpatient Prescriptions  Medication Sig Dispense Refill  . aspirin EC 81 MG tablet Take 1 tablet (81 mg total) by mouth daily.    Marland Kitchen atorvastatin (LIPITOR) 40 MG tablet Take 1 tablet (40 mg total) by mouth daily. 90 tablet 3  . BRILINTA 90 MG TABS tablet TAKE 1 TABLET BY MOUTH TWICE A DAY 60 tablet 11  . fenofibrate 160 MG tablet Take 1 tablet (160 mg total) by mouth daily. 90 tablet 3  . folic acid ([employer] FOLIC ACID) 989 MCG tablet Take 1 tablet (400 mcg total) by mouth daily.    Marland Kitchen glipiZIDE (GLUCOTROL) 5 MG tablet Take 1 tablet (5 mg total) by mouth daily before breakfast. 90 tablet 3  . lisinopril (ZESTRIL) 2.5 MG tablet Take 1 tablet (2.5 mg total) by mouth daily. 90 tablet 3  .  metFORMIN (GLUCOPHAGE) 500 MG tablet Take 1 tablet (500 mg total) by mouth 2 (two) times daily with a meal. 180 tablet 3  . metoprolol tartrate (LOPRESSOR) 25 MG tablet Take 0.5 tablets (12.5 mg total) by mouth 2 (two) times daily. 90 tablet 3  . nitroGLYCERIN (NITROSTAT) 0.4 MG SL tablet Place 1 tablet (0.4 mg total) under the tongue every 5 (five) minutes x 3 doses as needed for chest pain. 25 tablet 0  . OMEGA-3 KRILL OIL 300 MG CAPS Take by mouth daily.     Marland Kitchen SAFETY SEAL LANCETS MISC 1 cartridge by Does not apply route as directed. 1 each 5  . vitamin B-12 (CYANOCOBALAMIN) 1000 MCG tablet Take 1 tablet (1,000 mcg total) by mouth daily.    . vitamin B-6 (PYRIDOXINE) 25 MG tablet Take 1 tablet (25 mg total) by mouth daily.     No current facility-administered medications for this visit.    SOCIAL & FAMILY HISTORY: No change  ROS: A comprehensive Review of Systems - was performed. Review of Systems  Constitutional: Negative for malaise/fatigue.  HENT: Negative for nosebleeds.   Cardiovascular: Negative for claudication and leg swelling.  Gastrointestinal: Negative for constipation, blood in stool and melena.  Genitourinary: Negative for hematuria.  Endo/Heme/Allergies: Bruises/bleeds easily.  Psychiatric/Behavioral: Negative for depression and memory loss. The patient is not nervous/anxious.   All other systems reviewed and are negative.  PHYSICAL EXAM BP 104/70 mmHg  Pulse 68  Ht 5\' 10"  (1.778 m)  Wt 214 lb 3.2 oz (97.16 kg)  BMI 30.73 kg/m2 General appearance:  Alert and oriented X 3,cooperative, appears stated age, no distress and Well-nourished/well-groomed. Neck: no adenopathy, no carotid bruit, no JVD and supple, symmetrical, trachea midline Lungs: clear to auscultation bilaterally, normal percussion bilaterally and Nonlabored, good air movement Heart: regular rate and rhythm, S1, S2 normal, no murmur, click, rub or gallop and normal apical impulse Abdomen: soft, non-tender; bowel sounds normal; no masses,  no organomegaly Extremities: extremities normal, atraumatic, no cyanosis or edema Pulses: 2+ and symmetric Neurologic: normal strength and tone. Normal symmetric reflexes. Normal coordination and gait  QBH:[id number] today: Yes - stable, no change Rate:  60, Rhythm: NSR, cannot rule out inferior MI, age undetermined. Otherwise normal axis and intervals.  Recent Labs: None Since last visit.  ASSESSMENT / PLAN: Very stable one year out from inferior MI. He did have a mildly reduced ejection fraction at that time. We will recheck an echocardiogram to reassess baseline cardiac function. Otherwise he is stable enough for one year followup.  CAD S/P percutaneous coronary angioplasty - Promus Premier DES 3.0 mm x 18 mm (3.5 mm) to mid RCA occlusion; no  significant left-sided disease. Doing well.  ON DAPT still.   On BB, ACE-I as well as stating & fibrate.  HYPERLIPIDEMIA, MIXED -- significant hypertriglyceridemia Lab Results  Component Value Date   CHOL 205* 09/26/2014   HDL 29.10* 09/26/2014   LDLCALC 86 05/10/2014   LDLDIRECT 95.9 09/26/2014   TRIG 557.0* 09/26/2014   CHOLHDL 7 09/26/2014   Plan per PCP is to recheck in January - Unexpected increase in TG with fibrate.  Consider Omega 3 FA (Lovaza)   Diabetes mellitus type 2 with neurological manifestations On Metformin & Glipizide --> monitored by PCP.    Orders Placed This Encounter  Procedures  . EKG 12-Lead  . 2D Echocardiogram without contrast    Standing Status: Future     Number of Occurrences:      Standing Expiration Date:  11/22/2015    Order Specific Question:  Type of Echo    Answer:  Complete    Order Specific Question:  Where should this test be performed    Answer:  [facility]    Order Specific Question:  Reason for exam-Echo    Answer:  Myocardial Infact-old  410.9 / I21.3   No orders of the defined types were placed in this encounter.    Followup: 12 months  Jasilyn Holderman W. Ellyn Hack, M.D., M.S. Interventional Cardiology [facility]

## 2014-11-28 ENCOUNTER — Ambulatory Visit: Payer: BC Managed Care – PPO

## 2014-11-28 ENCOUNTER — Ambulatory Visit (HOSPITAL_COMMUNITY)
Admission: RE | Admit: 2014-11-28 | Discharge: 2014-11-28 | Disposition: A | Discharge status: Home / Self Care | Payer: BC Managed Care – PPO | Source: Ambulatory Visit | Attending: Cardiology | Admitting: Cardiology

## 2014-11-28 DIAGNOSIS — M722 Plantar fascial fibromatosis: Secondary | ICD-10-CM

## 2014-11-28 DIAGNOSIS — I10 Essential (primary) hypertension: Secondary | ICD-10-CM | POA: Diagnosis not present

## 2014-11-28 DIAGNOSIS — I2510 Atherosclerotic heart disease of native coronary artery without angina pectoris: Secondary | ICD-10-CM

## 2014-11-28 DIAGNOSIS — E119 Type 2 diabetes mellitus without complications: Secondary | ICD-10-CM | POA: Insufficient documentation

## 2014-11-28 DIAGNOSIS — Z9861 Coronary angioplasty status: Secondary | ICD-10-CM

## 2014-11-28 DIAGNOSIS — I252 Old myocardial infarction: Secondary | ICD-10-CM | POA: Insufficient documentation

## 2014-11-28 DIAGNOSIS — R5383 Other fatigue: Secondary | ICD-10-CM

## 2014-11-28 DIAGNOSIS — E1149 Type 2 diabetes mellitus with other diabetic neurological complication: Secondary | ICD-10-CM

## 2014-11-28 DIAGNOSIS — I2119 ST elevation (STEMI) myocardial infarction involving other coronary artery of inferior wall: Secondary | ICD-10-CM

## 2014-11-28 NOTE — Patient Instructions (Signed)

## 2014-11-28 NOTE — Progress Notes (Signed)
Pt is here to PUO 

## 2014-11-28 NOTE — Progress Notes (Signed)
2D Echo Performed 11/28/2014    Marygrace Drought, RCS

## 2014-12-03 ENCOUNTER — Other Ambulatory Visit: Payer: Self-pay | Admitting: Cardiology

## 2014-12-03 ENCOUNTER — Other Ambulatory Visit: Payer: Self-pay | Admitting: Family Medicine

## 2014-12-05 NOTE — Telephone Encounter (Signed)
Rx(s) sent to pharmacy electronically.  

## 2014-12-05 NOTE — Progress Notes (Signed)
Quick Note:  Echo results: Good news: Essentially normal echocardiogram and normal pump function and normal valve function.  EF: 55-60%. No regional wall motion abnormalities. No signs to suggest heart attack. Right ventricle is a bit enlarged - but with normal function.  Leonie Man, MD    ______

## 2014-12-22 ENCOUNTER — Other Ambulatory Visit (INDEPENDENT_AMBULATORY_CARE_PROVIDER_SITE_OTHER): Payer: 59

## 2014-12-22 DIAGNOSIS — E118 Type 2 diabetes mellitus with unspecified complications: Secondary | ICD-10-CM

## 2014-12-22 LAB — LIPID PANEL
CHOLESTEROL: 196 mg/dL (ref 0–200)
HDL: 37 mg/dL — AB (ref >39.00)
NonHDL: 159
Total CHOL/HDL Ratio: 5
Triglycerides: 370 mg/dL — ABNORMAL HIGH (ref 0.0–149.0)
VLDL: 74 mg/dL — AB (ref 0.0–40.0)

## 2014-12-22 LAB — HEMOGLOBIN A1C: Hgb A1c MFr Bld: 7.7 % — ABNORMAL HIGH (ref 4.6–6.5)

## 2014-12-22 LAB — LDL CHOLESTEROL, DIRECT: Direct LDL: 113 mg/dL

## 2014-12-29 ENCOUNTER — Ambulatory Visit: Payer: BC Managed Care – PPO | Admitting: Family Medicine

## 2015-01-17 ENCOUNTER — Ambulatory Visit (INDEPENDENT_AMBULATORY_CARE_PROVIDER_SITE_OTHER): Payer: 59 | Admitting: Family Medicine

## 2015-01-17 ENCOUNTER — Encounter: Payer: Self-pay | Admitting: Family Medicine

## 2015-01-17 VITALS — BP 102/64 | HR 76 | Temp 98.4°F | Wt 216.0 lb

## 2015-01-17 DIAGNOSIS — E1149 Type 2 diabetes mellitus with other diabetic neurological complication: Secondary | ICD-10-CM

## 2015-01-17 DIAGNOSIS — E1140 Type 2 diabetes mellitus with diabetic neuropathy, unspecified: Secondary | ICD-10-CM

## 2015-01-17 NOTE — Progress Notes (Signed)
Pre visit review using our clinic review tool, if applicable. No additional management support is needed unless otherwise documented below in the visit note.  Diabetes:  Using medications without difficulties: yes Hypoglycemic episodes: no Hyperglycemic episodes: no Feet problems: slightly more tingling than prev baseline Blood Sugars averaging: up to 180 in AM, not checked often eye exam within last year: d/w pt.  See AVS.  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  Diet and exercise affected by that.   A1c 7.7.   Prev EF improved on echo, d/w pt.    He has had some nasal crusting internally.  Using nasal saline.  D/w pt about using humidifier and he can use a small amount of vaseline if skin in nose is really dry.    Meds, vitals, and allergies reviewed.   ROS: See HPI.  Otherwise negative.    GEN: nad, alert and oriented HEENT: mucous membranes moist, nasal exam with internal irritation and with amount of scabbed material bilaterally  NECK: supple w/o LA CV: rrr. PULM: ctab, no inc wob ABD: soft, +bs EXT: no edema

## 2015-01-17 NOTE — Patient Instructions (Addendum)
Call [facility] and ask for a diabetic eye appointment.  [phone].  If you need a referral, then notify us.   Recheck A1c in about 3 months, before a visit.  Try to get back in the gym.  No meds changes at this point.  Take care.

## 2015-01-17 NOTE — Assessment & Plan Note (Signed)
Needs to get back in the gym.  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  Diet and exercise affected by that.   A1c 7.7.  No med changes.  He'll work on diet and exercise, recheck in 3 months.   He'll call about an eye exam.

## 2015-01-19 ENCOUNTER — Other Ambulatory Visit: Payer: Self-pay | Admitting: Cardiology

## 2015-01-19 NOTE — Telephone Encounter (Signed)
Rx refill sent to patient pharmacy   

## 2015-03-14 ENCOUNTER — Encounter: Payer: Self-pay | Admitting: Family Medicine

## 2015-03-14 ENCOUNTER — Ambulatory Visit (INDEPENDENT_AMBULATORY_CARE_PROVIDER_SITE_OTHER): Payer: 59 | Admitting: Family Medicine

## 2015-03-14 VITALS — BP 122/72 | HR 66 | Temp 97.8°F | Wt 216.0 lb

## 2015-03-14 DIAGNOSIS — J029 Acute pharyngitis, unspecified: Secondary | ICD-10-CM | POA: Diagnosis not present

## 2015-03-14 DIAGNOSIS — J069 Acute upper respiratory infection, unspecified: Secondary | ICD-10-CM | POA: Diagnosis not present

## 2015-03-14 LAB — POCT RAPID STREP A (OFFICE): RAPID STREP A SCREEN: NEGATIVE

## 2015-03-14 MED ORDER — LIDOCAINE VISCOUS 2 % MT SOLN: 10.0000 mL | OROMUCOSAL | Status: DC | PRN

## 2015-03-14 MED ORDER — GLIPIZIDE 5 MG PO TABS: ORAL_TABLET | ORAL | Status: DC

## 2015-03-14 NOTE — Patient Instructions (Signed)
Drink plenty of fluids, take tylenol as needed, and gargle with warm salt water for your throat.  Use lidocaine if needed for the throat pain. This should gradually improve.  Take care.  Let us know if you have other concerns.

## 2015-03-14 NOTE — Progress Notes (Signed)
Pre visit review using our clinic review tool, if applicable. No additional management support is needed unless otherwise documented below in the visit note.  duration of symptoms: in the last few days.  Rhinorrhea: likely from allergies.   Congestion: a little, recently ear pain: no sore throat: more ST yesterday, noted voice was different Cough: no Myalgias: no- has leg aches at baseline likely from DM2.   other concerns: mult sick contacts at work.   Wife also had a ST.   No fevers on temp checks  ROS: See HPI.  Otherwise negative.    Meds, vitals, and allergies reviewed.   GEN: nad, alert and oriented HEENT: mucous membranes moist, TM w/o erythema, nasal epithelium injected, OP with irritation limited to the soft palate NECK: supple w/o LA CV: rrr. PULM: ctab, no inc wob ABD: soft, +bs EXT: no edema

## 2015-03-15 DIAGNOSIS — J069 Acute upper respiratory infection, unspecified: Secondary | ICD-10-CM | POA: Insufficient documentation

## 2015-03-15 IMAGING — CR DG CHEST 1V PORT
1 series · 1 of 1 positions shown · non-contrast
Comparison: None.

CLINICAL DATA: Chest pain. STEMI

EXAM:
PORTABLE CHEST - 1 VIEW

[AP]
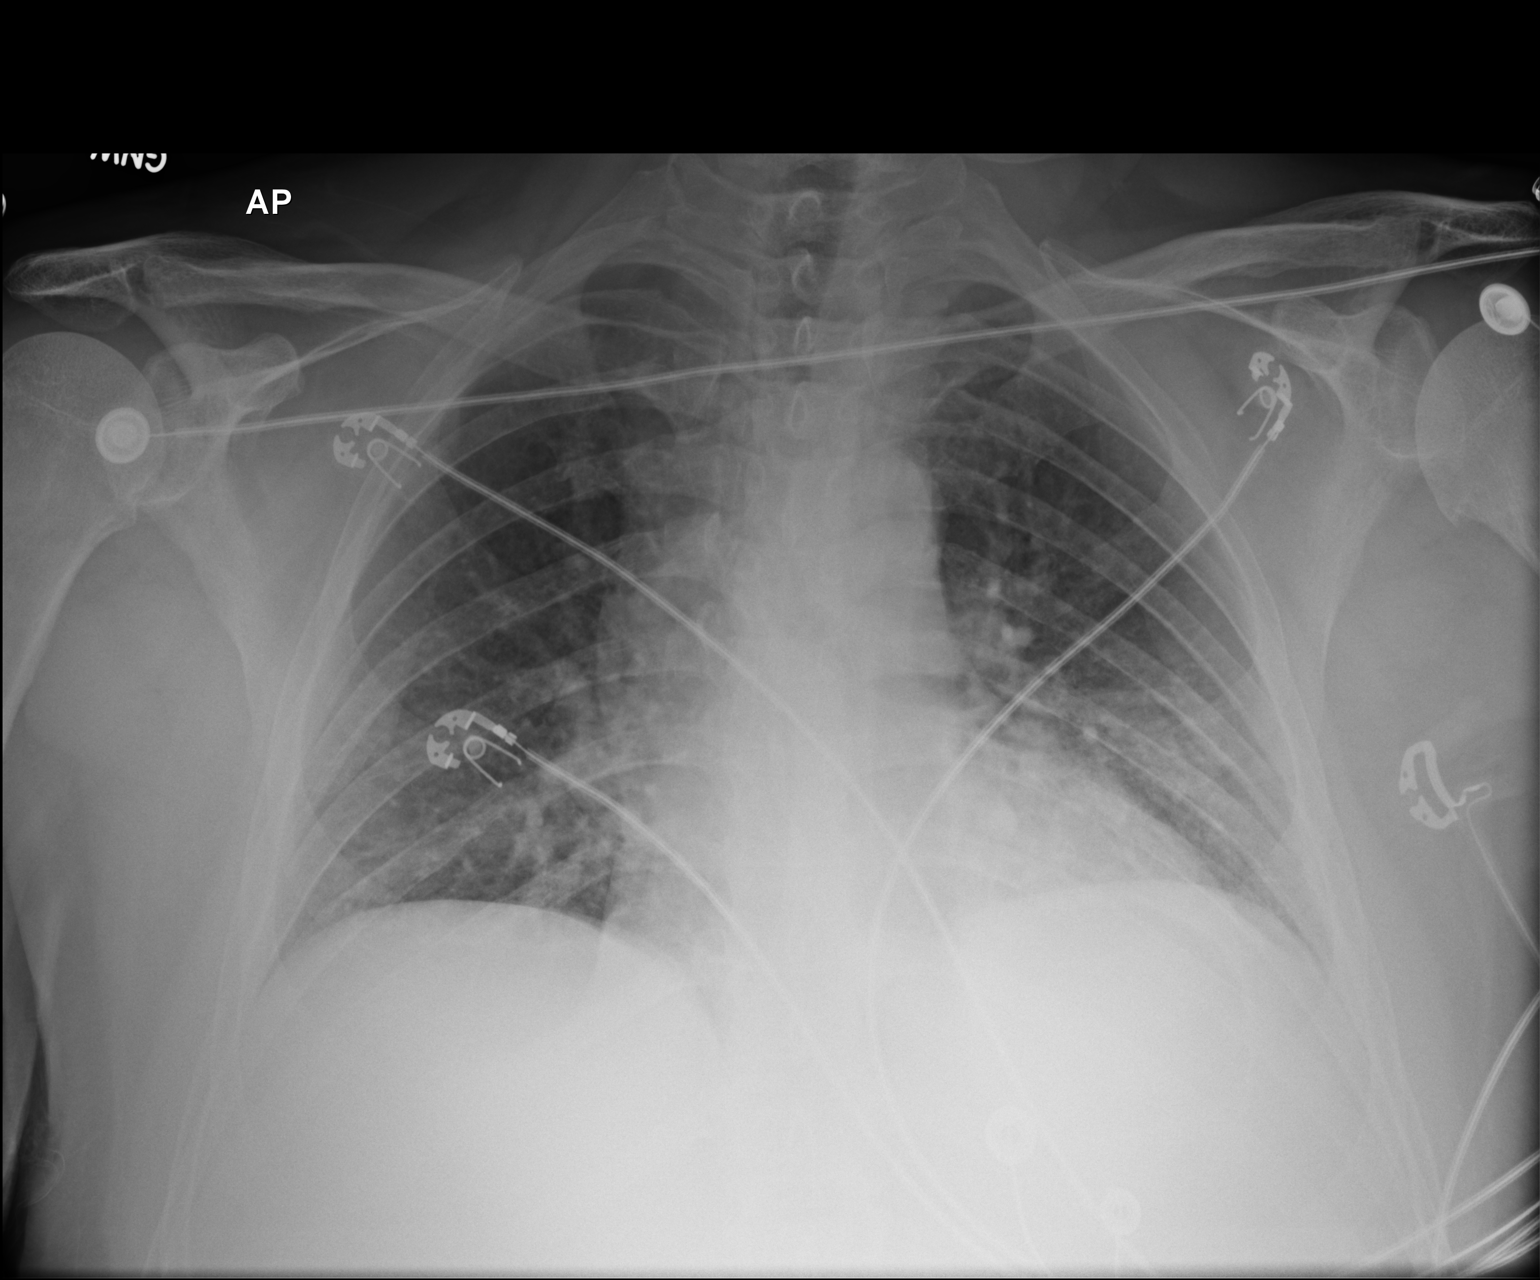

[1 of 1 positions shown; findings below may reference images not displayed]

FINDINGS: Shallow inspiration. Cardiac enlargement without vascular
congestion. Infiltration or atelectasis suggested in the left mid
lung. No blunting of costophrenic angles. No pneumothorax.
IMPRESSION: Shallow inspiration with infiltration or atelectasis in the left mid
lung. Cardiac enlargement.

## 2015-03-15 NOTE — Assessment & Plan Note (Signed)
Likely viral, nontoxic, dw pt.  RST neg.  Supportive care, use lidocaine for ST if salt water gargles don't help.  He agrees.  F/u prn.

## 2015-04-09 ENCOUNTER — Other Ambulatory Visit: Payer: Self-pay | Admitting: Family Medicine

## 2015-04-09 DIAGNOSIS — E1149 Type 2 diabetes mellitus with other diabetic neurological complication: Secondary | ICD-10-CM

## 2015-04-11 ENCOUNTER — Other Ambulatory Visit (INDEPENDENT_AMBULATORY_CARE_PROVIDER_SITE_OTHER): Payer: 59

## 2015-04-11 DIAGNOSIS — E1140 Type 2 diabetes mellitus with diabetic neuropathy, unspecified: Secondary | ICD-10-CM | POA: Diagnosis not present

## 2015-04-11 DIAGNOSIS — E1149 Type 2 diabetes mellitus with other diabetic neurological complication: Secondary | ICD-10-CM

## 2015-04-11 LAB — HEMOGLOBIN A1C: Hgb A1c MFr Bld: 7.8 % — ABNORMAL HIGH (ref 4.6–6.5)

## 2015-04-17 ENCOUNTER — Ambulatory Visit (INDEPENDENT_AMBULATORY_CARE_PROVIDER_SITE_OTHER): Payer: 59 | Admitting: Family Medicine

## 2015-04-17 ENCOUNTER — Encounter: Payer: Self-pay | Admitting: Family Medicine

## 2015-04-17 VITALS — BP 120/74 | HR 79 | Temp 98.6°F | Wt 214.5 lb

## 2015-04-17 DIAGNOSIS — E1140 Type 2 diabetes mellitus with diabetic neuropathy, unspecified: Secondary | ICD-10-CM

## 2015-04-17 DIAGNOSIS — E1149 Type 2 diabetes mellitus with other diabetic neurological complication: Secondary | ICD-10-CM

## 2015-04-17 NOTE — Patient Instructions (Signed)
Recheck labs before a visit in about 3 months.  Fruit at night for a snack.  Cut out the ice cream.  Gradual return to exercise.  Take care.  Glad to see you.

## 2015-04-17 NOTE — Progress Notes (Signed)
Pre visit review using our clinic review tool, if applicable. No additional management support is needed unless otherwise documented below in the visit note.  DM2.  D/w pt.  A1c stable, but not <7.  Has been exercising some, but not enough per patient report.  He is working on diet, cut out soft drinks.  He has help at work now, that should help with him getting out to exercise.  He thinks he is doing better on diet then exercise usually.  He is eating more salads and working on his snacking at night.    No CP.  Not SOB unless working hard for prolonged period.  No BLE edema.   Foot sx at baseline, some tingling.    We talked about med options.  He didn't want to add extra meds and I think that is reasonable given his hx (he has gotten his A1c down prev).    Meds, vitals, and allergies reviewed.   ROS: See HPI.  Otherwise, noncontributory.  GEN: nad, alert and oriented HEENT: mucous membranes moist NECK: supple w/o LA CV: rrr. PULM: ctab, no inc wob ABD: soft, +bs EXT: no edema SKIN: no acute rash

## 2015-04-19 NOTE — Assessment & Plan Note (Signed)
We talked about med options. He didn't want to add extra meds and I think that is reasonable given his hx (he has gotten his A1c down prev).  See AVS re: diet tips.  Recheck A1c in a few months.  He agrees.

## 2015-06-13 ENCOUNTER — Other Ambulatory Visit: Payer: Self-pay | Admitting: Cardiology

## 2015-06-13 NOTE — Telephone Encounter (Signed)
REFILL 

## 2015-06-14 ENCOUNTER — Other Ambulatory Visit: Payer: Self-pay | Admitting: *Deleted

## 2015-06-14 MED ORDER — FENOFIBRATE 160 MG PO TABS: 160.0000 mg | ORAL_TABLET | Freq: Every day | ORAL | Status: DC

## 2015-06-14 MED ORDER — METOPROLOL TARTRATE 25 MG PO TABS: 12.5000 mg | ORAL_TABLET | Freq: Two times a day (BID) | ORAL | Status: DC

## 2015-06-14 MED ORDER — ATORVASTATIN CALCIUM 40 MG PO TABS: 40.0000 mg | ORAL_TABLET | Freq: Every day | ORAL | Status: DC

## 2015-07-18 ENCOUNTER — Other Ambulatory Visit: Payer: 59

## 2015-07-20 ENCOUNTER — Ambulatory Visit: Payer: 59 | Admitting: Family Medicine

## 2015-07-21 ENCOUNTER — Other Ambulatory Visit: Payer: Self-pay | Admitting: Family Medicine

## 2015-07-21 ENCOUNTER — Other Ambulatory Visit (INDEPENDENT_AMBULATORY_CARE_PROVIDER_SITE_OTHER): Payer: 59

## 2015-07-21 DIAGNOSIS — E1149 Type 2 diabetes mellitus with other diabetic neurological complication: Secondary | ICD-10-CM

## 2015-07-21 DIAGNOSIS — E1140 Type 2 diabetes mellitus with diabetic neuropathy, unspecified: Secondary | ICD-10-CM | POA: Diagnosis not present

## 2015-07-21 LAB — HEMOGLOBIN A1C: Hgb A1c MFr Bld: 7.4 % — ABNORMAL HIGH (ref 4.6–6.5)

## 2015-07-24 ENCOUNTER — Ambulatory Visit (INDEPENDENT_AMBULATORY_CARE_PROVIDER_SITE_OTHER): Payer: 59 | Admitting: Family Medicine

## 2015-07-24 ENCOUNTER — Encounter: Payer: Self-pay | Admitting: Family Medicine

## 2015-07-24 VITALS — BP 132/74 | HR 86 | Temp 98.6°F | Wt 215.0 lb

## 2015-07-24 DIAGNOSIS — E1140 Type 2 diabetes mellitus with diabetic neuropathy, unspecified: Secondary | ICD-10-CM | POA: Diagnosis not present

## 2015-07-24 DIAGNOSIS — E119 Type 2 diabetes mellitus without complications: Secondary | ICD-10-CM | POA: Diagnosis not present

## 2015-07-24 DIAGNOSIS — Z119 Encounter for screening for infectious and parasitic diseases, unspecified: Secondary | ICD-10-CM

## 2015-07-24 DIAGNOSIS — E1149 Type 2 diabetes mellitus with other diabetic neurological complication: Secondary | ICD-10-CM

## 2015-07-24 NOTE — Patient Instructions (Addendum)
Call about an eye exam.   Keep working on cutting out soda and carbs.  Gradually work on getting more exercise on the treadmill.  Recheck in 3 months at a physical.  Labs ahead of time.  Glad to see you.

## 2015-07-24 NOTE — Progress Notes (Signed)
Pre visit review using our clinic review tool, if applicable. No additional management support is needed unless otherwise documented below in the visit note.  Diabetes:  Using medications without difficulties: yes Hypoglycemic episodes: rare, if prolonged fasting Hyperglycemic episodes: no Feet problems: less burning and pain, less cramping now Blood Sugars averaging: not checked often eye exam within last year: due this fall.   A1c improved, not at goal but better.   He cut out some sodas.  D/w pt about diet and exercise.   D/w pt about HIV and HCV screening.  He opts in for next lab draw.    Meds, vitals, and allergies reviewed.   ROS: See HPI.  Otherwise negative.    GEN: nad, alert and oriented HEENT: mucous membranes moist NECK: supple w/o LA CV: rrr. PULM: ctab, no inc wob ABD: soft, +bs EXT: no edema SKIN: no acute rash

## 2015-07-25 NOTE — Assessment & Plan Note (Signed)
Foot sx improved with improved A1c,  eye exam within last year: due this fall.   D/ wpt.   A1c improved, not at goal but better.   He cut out some sodas.  D/w pt about diet and exercise.  Recheck in about 3 months with CPE.  Continue work on diet and exercise.  No med change today.

## 2015-07-31 ENCOUNTER — Other Ambulatory Visit: Payer: Self-pay | Admitting: Cardiology

## 2015-07-31 MED ORDER — NITROGLYCERIN 0.4 MG SL SUBL: 0.4000 mg | SUBLINGUAL_TABLET | SUBLINGUAL | Status: DC | PRN

## 2015-07-31 NOTE — Telephone Encounter (Signed)
°  1. Which medications need to be refilled? NTG  2. Which pharmacy is medication to be sent to? [facility]   3. Do they need a 30 day or 90 day supply? 30  4. Would they like a call back once the medication has been sent to the pharmacy? Yes

## 2015-07-31 NOTE — Addendum Note (Signed)
Addended by: Diana Eves on: 07/31/2015 01:57 PM   Modules accepted: Orders

## 2015-07-31 NOTE — Telephone Encounter (Signed)
Rx(s) sent to pharmacy electronically. Patient notified. 

## 2015-08-06 ENCOUNTER — Other Ambulatory Visit: Payer: Self-pay | Admitting: Family Medicine

## 2015-08-08 NOTE — Telephone Encounter (Signed)
Received refill electronically from pharmacy See allergy/contraindiction Is it okay to refill?

## 2015-08-08 NOTE — Telephone Encounter (Signed)
Sent. Thanks.   

## 2015-09-27 ENCOUNTER — Other Ambulatory Visit: Payer: Self-pay | Admitting: Family Medicine

## 2015-09-27 ENCOUNTER — Other Ambulatory Visit (INDEPENDENT_AMBULATORY_CARE_PROVIDER_SITE_OTHER): Payer: 59

## 2015-09-27 DIAGNOSIS — E119 Type 2 diabetes mellitus without complications: Secondary | ICD-10-CM | POA: Diagnosis not present

## 2015-09-27 DIAGNOSIS — Z119 Encounter for screening for infectious and parasitic diseases, unspecified: Secondary | ICD-10-CM

## 2015-09-27 LAB — COMPREHENSIVE METABOLIC PANEL
ALBUMIN: 4.4 g/dL (ref 3.5–5.2)
ALK PHOS: 35 U/L — AB (ref 39–117)
ALT: 34 U/L (ref 0–53)
AST: 20 U/L (ref 0–37)
BUN: 16 mg/dL (ref 6–23)
CALCIUM: 9.5 mg/dL (ref 8.4–10.5)
CO2: 25 mEq/L (ref 19–32)
Chloride: 102 mEq/L (ref 96–112)
Creatinine, Ser: 0.86 mg/dL (ref 0.40–1.50)
GFR: 97.81 mL/min (ref >60.00)
Glucose, Bld: 185 mg/dL — ABNORMAL HIGH (ref 70–99)
POTASSIUM: 4.4 meq/L (ref 3.5–5.1)
Sodium: 137 mEq/L (ref 135–145)
TOTAL PROTEIN: 7 g/dL (ref 6.0–8.3)
Total Bilirubin: 0.5 mg/dL (ref 0.2–1.2)

## 2015-09-27 LAB — LIPID PANEL
CHOL/HDL RATIO: 7
CHOLESTEROL: 206 mg/dL — AB (ref 0–200)
HDL: 31.6 mg/dL — AB (ref >39.00)
Triglycerides: 538 mg/dL — ABNORMAL HIGH (ref 0.0–149.0)

## 2015-09-27 LAB — HEMOGLOBIN A1C: HEMOGLOBIN A1C: 7.5 % — AB (ref 4.6–6.5)

## 2015-09-27 LAB — LDL CHOLESTEROL, DIRECT: Direct LDL: 92 mg/dL

## 2015-09-28 LAB — HEPATITIS C ANTIBODY: HCV AB: NEGATIVE

## 2015-09-28 LAB — HIV ANTIBODY (ROUTINE TESTING W REFLEX): HIV: NONREACTIVE

## 2015-10-03 ENCOUNTER — Other Ambulatory Visit: Payer: Self-pay | Admitting: Cardiology

## 2015-10-03 ENCOUNTER — Encounter: Payer: 59 | Admitting: Family Medicine

## 2015-10-05 ENCOUNTER — Encounter: Payer: Self-pay | Admitting: Family Medicine

## 2015-10-05 ENCOUNTER — Ambulatory Visit (INDEPENDENT_AMBULATORY_CARE_PROVIDER_SITE_OTHER): Payer: 59 | Admitting: Family Medicine

## 2015-10-05 VITALS — BP 126/68 | HR 88 | Temp 98.7°F | Ht 70.0 in | Wt 213.8 lb

## 2015-10-05 DIAGNOSIS — Z23 Encounter for immunization: Secondary | ICD-10-CM

## 2015-10-05 DIAGNOSIS — Z0000 Encounter for general adult medical examination without abnormal findings: Secondary | ICD-10-CM

## 2015-10-05 DIAGNOSIS — E1149 Type 2 diabetes mellitus with other diabetic neurological complication: Secondary | ICD-10-CM

## 2015-10-05 DIAGNOSIS — Z9861 Coronary angioplasty status: Secondary | ICD-10-CM

## 2015-10-05 DIAGNOSIS — E782 Mixed hyperlipidemia: Secondary | ICD-10-CM

## 2015-10-05 DIAGNOSIS — I2510 Atherosclerotic heart disease of native coronary artery without angina pectoris: Secondary | ICD-10-CM

## 2015-10-05 DIAGNOSIS — I10 Essential (primary) hypertension: Secondary | ICD-10-CM

## 2015-10-05 MED ORDER — METFORMIN HCL 500 MG PO TABS: ORAL_TABLET | ORAL | Status: DC

## 2015-10-05 MED ORDER — GLIPIZIDE 5 MG PO TABS: ORAL_TABLET | ORAL | Status: DC

## 2015-10-05 NOTE — Patient Instructions (Addendum)
Call about an eye exam.  Call cardiology about a follow up appointment.  Recheck labs in about 4 months, before a visit.  Take care.  Glad to see you.  Get back in the gym.

## 2015-10-05 NOTE — Progress Notes (Signed)
Pre visit review using our clinic review tool, if applicable. No additional management support is needed unless otherwise documented below in the visit note.  CPE- See plan.  Routine anticipatory guidance given to patient.  See health maintenance. Tetanus 2015 Flu shot today.  PNA prev done.  Shingles due at 2.  Prostate cancer screening and PSA options (with potential risks and benefits of testing vs not testing) were discussed along with recent recs/guidelines.  He declined testing PSA at this point. Colonoscopy 2011 Living will d/w pt. Wife designated if patient were incapacitated.  Diet and exercise d/w pt. Encouraged on both.  Diabetes:  Using medications without difficulties: yes Hypoglycemic episodes: only if prolonged fasting.   Hyperglycemic episodes: no sx Feet problems: some pain and tingling Blood Sugars averaging: not checked eye exam within last year:  Due, d/w pt.   a1c and labs d/w pt.   Elevated Cholesterol: Using medications without problems:yes Muscle aches: no Diet compliance:yes Exercise:no, encouraged  PMH and SH reviewed  Meds, vitals, and allergies reviewed.   ROS: See HPI.  Otherwise negative.    GEN: nad, alert and oriented HEENT: mucous membranes moist NECK: supple w/o LA CV: rrr. PULM: ctab, no inc wob ABD: soft, +bs EXT: no edema SKIN: no acute rash  Diabetic foot exam: Normal inspection No skin breakdown No calluses  Normal DP pulses Normal sensation to light touch and monofilament Nails normal

## 2015-10-06 NOTE — Assessment & Plan Note (Signed)
Sensation intact, but with tingling present likely from neuropathy.  Not at the point of needing med change.  Needs more work on diet and exercise/weight.  D/w pt.  He agrees. Recheck in a few months. Labs d/w pt.

## 2015-10-06 NOTE — Assessment & Plan Note (Signed)
Routine anticipatory guidance given to patient. See health maintenance.  Tetanus 2015  Flu shot today.  PNA prev done.  Shingles due at 35.  Prostate cancer screening and PSA options (with potential risks and benefits of testing vs not testing) were discussed along with recent recs/guidelines. He declined testing PSA at this point.  Colonoscopy 2011  Living will d/w pt. Wife designated if patient were incapacitated.  Diet and exercise d/w pt. Encouraged on both.

## 2015-10-06 NOTE — Assessment & Plan Note (Signed)
Not at the point of needing med change.  Needs more work on diet and exercise/weight.  D/w pt.  He agrees.

## 2015-10-06 NOTE — Assessment & Plan Note (Signed)
He'll call about f/u with cards.   

## 2015-10-31 ENCOUNTER — Telehealth: Payer: Self-pay | Admitting: Cardiology

## 2015-10-31 NOTE — Telephone Encounter (Signed)
Sounds like a plan -- 12/1 should be OK  HARDING, Leonie Green, MD

## 2015-10-31 NOTE — Telephone Encounter (Signed)
Pt is calling in stating that since 11/25 his upper molars had been causing him some pain (coming and going) and here today his lower molars are now causing pain as well( his uppers are now longer causing problems.) He is just curious as to what might be causing this pain. He is unsure if this is cardiac related or sinus related. Please f/u with him  Thanks

## 2015-10-31 NOTE — Telephone Encounter (Signed)
Returned call to patient.He stated he has been having rt upper jaw pain off and on since Friday 10/27/15.Stated he saw dentist yesterday 10/30/15.Xrays were done and they were normal.Stated dentist thought he might have a sinus infection and he prescribed Amoxicillin.Stated he has no other symptoms of a sinus infection.Stated today he is having pain off and on in right lower jaw.No pain at present.Stated his wife is worried pain could be coming from heart.Stated he has not had any chest pain.No sob.Appointment scheduled with Mare Loan PA 11/02/15 at 9:00 am at [facility] office.Advised to go to ER if needed.

## 2015-11-02 ENCOUNTER — Ambulatory Visit (INDEPENDENT_AMBULATORY_CARE_PROVIDER_SITE_OTHER): Payer: 59 | Admitting: Cardiology

## 2015-11-02 ENCOUNTER — Encounter: Payer: Self-pay | Admitting: Cardiology

## 2015-11-02 VITALS — BP 120/78 | HR 63 | Ht 70.0 in | Wt 215.4 lb

## 2015-11-02 DIAGNOSIS — Z9861 Coronary angioplasty status: Secondary | ICD-10-CM

## 2015-11-02 DIAGNOSIS — I2510 Atherosclerotic heart disease of native coronary artery without angina pectoris: Secondary | ICD-10-CM

## 2015-11-02 DIAGNOSIS — I208 Other forms of angina pectoris: Secondary | ICD-10-CM

## 2015-11-02 MED ORDER — LISINOPRIL 2.5 MG PO TABS: ORAL_TABLET | ORAL | Status: DC

## 2015-11-02 MED ORDER — TICAGRELOR 90 MG PO TABS: 90.0000 mg | ORAL_TABLET | Freq: Two times a day (BID) | ORAL | Status: DC

## 2015-11-02 MED ORDER — ATORVASTATIN CALCIUM 40 MG PO TABS: 40.0000 mg | ORAL_TABLET | Freq: Every day | ORAL | Status: DC

## 2015-11-02 MED ORDER — FENOFIBRATE 160 MG PO TABS: 160.0000 mg | ORAL_TABLET | Freq: Every day | ORAL | Status: DC

## 2015-11-02 MED ORDER — METOPROLOL TARTRATE 25 MG PO TABS: 12.5000 mg | ORAL_TABLET | Freq: Two times a day (BID) | ORAL | Status: DC

## 2015-11-02 NOTE — Patient Instructions (Addendum)
Medication Instructions:  Your physician recommends that you continue on your current medications as directed. Please refer to the Current Medication list given to you today.    Labwork: None ordered  Testing/Procedures: Your physician has requested that you have en exercise stress myoview. For further information please visit [email or web address]. Please follow instruction sheet, as given.    Follow-Up: Your physician wants you to follow-up in: [place] [address] [place].  You will receive a reminder letter in the mail two months in advance. If you don't receive a letter, please call our office to schedule the follow-up appointment.    Any Other Special Instructions Will Be Listed Below (If Applicable).    Exercise Stress Electrocardiogram An exercise stress electrocardiogram is a test that is done to evaluate the blood supply to your heart. This test may also be called exercise stress electrocardiography. The test is done while you are walking on a treadmill. The goal of this test is to raise your heart rate. This test is done to find areas of poor blood flow to the heart by determining the extent of coronary artery disease (CAD).   CAD is defined as narrowing in one or more heart (coronary) arteries of more than 70%. If you have an abnormal test result, this may mean that you are not getting adequate blood flow to your heart during exercise. Additional testing may be needed to understand why your test was abnormal. LET [facility] PROVIDER KNOW ABOUT:   Any allergies you have.  All medicines you are taking, including vitamins, herbs, eye drops, creams, and over-the-counter medicines.  Previous problems you or members of your family have had with the use of anesthetics.  Any blood disorders you have.  Previous surgeries you have had.  Medical conditions you have.  Possibility of pregnancy, if this applies. RISKS AND COMPLICATIONS Generally, this is a safe procedure.  However, as with any procedure, complications can occur. Possible complications can include:  Pain or pressure in the following areas:  Chest.  Jaw or neck.  Between your shoulder blades.  Radiating down your left arm.  Dizziness or light-headedness.  Shortness of breath.  Increased or irregular heartbeats.  Nausea or vomiting.  Heart attack (rare). BEFORE THE PROCEDURE  Avoid all forms of caffeine 24 hours before your test or as directed by your health care provider. This includes coffee, tea (even decaffeinated tea), caffeinated sodas, chocolate, cocoa, and certain pain medicines.  Follow your health care provider's instructions regarding eating and drinking before the test.  Take your medicines as directed at regular times with water unless instructed otherwise. Exceptions may include:  If you have diabetes, ask how you are to take your insulin or pills. It is common to adjust insulin dosing the morning of the test.  If you are taking beta-blocker medicines, it is important to talk to your health care provider about these medicines well before the date of your test. Taking beta-blocker medicines may interfere with the test. In some cases, these medicines need to be changed or stopped 24 hours or more before the test.  If you wear a nitroglycerin patch, it may need to be removed prior to the test. Ask your health care provider if the patch should be removed before the test.  If you use an inhaler for any breathing condition, bring it with you to the test.  If you are an outpatient, bring a snack so you can eat right after the stress phase of the test.  Do not smoke for 4 hours prior to the test or as directed by your health care provider.  Do not apply lotions, powders, creams, or oils on your chest prior to the test.  Wear loose-fitting clothes and comfortable shoes for the test. This test involves walking on a treadmill. PROCEDURE  Multiple patches (electrodes) will  be put on your chest. If needed, small areas of your chest may have to be shaved to get better contact with the electrodes. Once the electrodes are attached to your body, multiple wires will be attached to the electrodes and your heart rate will be monitored.  Your heart will be monitored both at rest and while exercising.  You will walk on a treadmill. The treadmill will be started at a slow pace. The treadmill speed and incline will gradually be increased to raise your heart rate. AFTER THE PROCEDURE  Your heart rate and blood pressure will be monitored after the test.  You may return to your normal schedule including diet, activities, and medicines, unless your health care provider tells you otherwise.   This information is not intended to replace advice given to you by your health care provider. Make sure you discuss any questions you have with your health care provider.   Document Released: 11/15/2000 Document Revised: 11/23/2013 Document Reviewed: 07/26/2013 Elsevier Interactive Patient Education [employer].     If you need a refill on your cardiac medications before your next appointment, please call your pharmacy.

## 2015-11-02 NOTE — Progress Notes (Signed)
11/02/2015 [name] [name]   [birth year]  [id number]  Primary Physician Elsie Stain, MD Primary Cardiologist: Dr. Ellyn Hack   Reason for Visit/CC: Jaw Pain; f/u for CAD  HPI:  [name] [name] is a 54 y.o. male with a PMH of poorly controlled HLD, HTN, poorly controlled DM-2 & prior tobacco abuse who presented to [facility] ER on 12/03/13 with a complaint of severe substernal chest pain with associated diaphroesis and nausea. An EKG revealed inferior ST elevations with a suggestion of possible posterior infarction as well. Code STEMI was activated and he was taken urgently to the cath lab by Dr. Debby Bud for emergent LHC and PCI. He was found to have single-vessel CAD with 100% thrombotic occlusion of the dominant mid RCA. This was successfully treated with PCI with a Xience Alpine DES 3.0 mm x 18 mm, postdilated to 3.5 mm. He had preserved LVEF with expected mild basal inferior hypokinesis and normal EDP. A 2D echo revealed an EF of 45-50%. He was placed on DAPT with ASA + Brilinta and also started on a BB, ACE-I, Lipitor and fenofibrate. The remainder of his hospitalization was uncomplicated. He was discharged home on 12/04/13.  He has done fairly well since his STEMI. His last OV with Dr. Ellyn Hack was 11/22/14. At that time, an echocardiogram was ordered to reassess LVF. This showed improvement in EF to normal range at 55-60%. There were no regional wall motion abnormalities. Valvular function was normal.  His ASA was also discontinued due to frequent epistaxis. He was continued on Brilinta and has done well since, in regarding to bleeding.   On 10/31/15, he called the office endorsing a 4 day h/o intermittent right  jaw pain. It initialy presented as right upper jaw pain but has since progressed down to his right lower jaw. He saw his dentist on 10/30/15 and Xrays were normal. Dental exam was also normal. His dentisit thought possibly it may be secondary to a sinus infection, thus he was  prescribed amoxicilline. However the patient denies any other symptoms of a sinus infection. He presents to clinic today to r/o possible cardiac causes.   He does note that that his pain is reproducible and exacerbated with exertion. He denies any associated chest pain, which was his presenting symptom at the time of his MI in 2015. He does note feeling more fatigued lately and notes mild impairment in regards to exercise tolerance.   He is currently pain free in clinic today and EKG shows SR w/o any acute abnormalities. His EKG is unchanged compared to previous EKG 11/2014. He reports full medication compliance and denies any further tobacco use since his MI in 2015. Review of recent labs continue to show poorly controlled cholesterol and DM. Triglyerides 09/2015 were >500. LDL was 92. Hgb A1c was 7.5.    Current Outpatient Prescriptions  Medication Sig Dispense Refill  . glipiZIDE (GLUCOTROL) 5 MG tablet Take 5 mg by mouth daily before breakfast.    . atorvastatin (LIPITOR) 40 MG tablet Take 1 tablet (40 mg total) by mouth daily. 90 tablet 3  . BRILINTA 90 MG TABS tablet TAKE 1 TABLET BY MOUTH TWICE A DAY 60 tablet 9  . fenofibrate 160 MG tablet Take 1 tablet (160 mg total) by mouth daily. 90 tablet 3  . folic acid ([facility] FOLIC ACID) A999333 MCG tablet Take 1 tablet (400 mcg total) by mouth daily.    Marland Kitchen lisinopril (PRINIVIL,ZESTRIL) 2.5 MG tablet Take 1 tablet by mouth  daily 90  tablet 0  . metFORMIN (GLUCOPHAGE) 500 MG tablet Take 1 tablet by mouth  twice daily with meals 180 tablet 3  . metoprolol tartrate (LOPRESSOR) 25 MG tablet Take 0.5 tablets (12.5 mg total) by mouth 2 (two) times daily. 90 tablet 3  . nitroGLYCERIN (NITROSTAT) 0.4 MG SL tablet Place 1 tablet (0.4 mg total) under the tongue every 5 (five) minutes x 3 doses as needed for chest pain. 25 tablet 2  . OMEGA-3 KRILL OIL 300 MG CAPS Take 1 capsule by mouth daily.     . vitamin B-12 (CYANOCOBALAMIN) 1000 MCG tablet Take 1 tablet (1,000  mcg total) by mouth daily.    . vitamin B-6 (PYRIDOXINE) 25 MG tablet Take 1 tablet (25 mg total) by mouth daily.     No current facility-administered medications for this visit.    Allergies  Allergen Reactions  . Metformin And Related     Intolerant of more than 1000mg  a day    Social History   Social History  . Marital Status: Married    Spouse Name: N/A  . Number of Children: 1  . Years of Education: N/A   Occupational History  . Operations Automotive engineer   Social History Main Topics  . Smoking status: Former Smoker -- 0.50 packs/day for 20 years    Types: Cigarettes    Quit date: 12/03/2013  . Smokeless tobacco: Never Used  . Alcohol Use: Yes     Comment: occ on weekends  . Drug Use: No  . Sexual Activity: Not on file   Other Topics Concern  . Not on file   Social History Narrative   Married 1988 and lives with wife   One son   Works at Museum/gallery exhibitions officer, Librarian, academic.   Forward 10 pack year smoker, who quit on the day of his MI.     Review of Systems: General: negative for chills, fever, night sweats or weight changes.  Cardiovascular: negative for chest pain, dyspnea on exertion, edema, orthopnea, palpitations, paroxysmal nocturnal dyspnea or shortness of breath Dermatological: negative for rash Respiratory: negative for cough or wheezing Urologic: negative for hematuria Abdominal: negative for nausea, vomiting, diarrhea, bright red blood per rectum, melena, or hematemesis Neurologic: negative for visual changes, syncope, or dizziness All other systems reviewed and are otherwise negative except as noted above.    Blood pressure 120/78, pulse 63, height 5\' 10"  (1.778 m), weight 215 lb 6.4 oz (97.705 kg).  General appearance: alert, cooperative and no distress Neck: no carotid bruit and no JVD Lungs: clear to auscultation bilaterally Heart: regular rate and rhythm, S1, S2 normal, no murmur, click, rub or  gallop Extremities: no LEE Pulses: 2+ and symmetric Skin: warm and dry Neurologic: Grossly normal  EKG NSR. Prior inferior infarct. No change from prior EKG 11/22/14  ASSESSMENT AND PLAN:   1. Right Jaw Pain: ? If this is an anginal equivalent. He denies chest pain, however there has been an exertional component to his recent jaw pain. He is currently pain free and EKG shows no new abnormalities. Given his known history of CAD with prior MI along with other poorly controlled risk factors, will plan for an Exercise Myoview to assess for ischemia. Patient instructed to hold metoprolol morning of test. He has PRN SL NTG. Continue Brilinta and Lipitor.   2. CAD: h/o inferior MI 12/2013. S/p PCI + DES to mid RCA. Now with new symptoms concerning for potential angina. Plan of  NST as outlined above. Continue Brilinta. No ASA 2/2 epistaxis with DATP. Continue metoprolol, statin and fenofibrate.   3. DLD: Triglycerides severely elevated at 538. LDL 92. Followed by PCP who recommended continuation of Lipitor and fenofibrate + lifestyle modification through increased physical activity and improved diet.   4. DM: Hgb A1c 7.5. Followed by PCP. Goal is <7.  SIMMONS, BRITTAINY PA-C 11/02/2015 9:06 AM

## 2015-11-07 ENCOUNTER — Ambulatory Visit: Payer: 59 | Admitting: Cardiology

## 2015-11-07 ENCOUNTER — Telehealth (HOSPITAL_COMMUNITY): Payer: Self-pay

## 2015-11-07 NOTE — Telephone Encounter (Signed)
Encounter complete. 

## 2015-11-09 ENCOUNTER — Ambulatory Visit (HOSPITAL_COMMUNITY)
Admission: RE | Admit: 2015-11-09 | Discharge: 2015-11-09 | Disposition: A | Discharge status: Home / Self Care | Payer: 59 | Source: Ambulatory Visit | Attending: Cardiovascular Disease | Admitting: Cardiovascular Disease

## 2015-11-09 DIAGNOSIS — I208 Other forms of angina pectoris: Secondary | ICD-10-CM | POA: Insufficient documentation

## 2015-11-09 DIAGNOSIS — Z6830 Body mass index (BMI) 30.0-30.9, adult: Secondary | ICD-10-CM | POA: Diagnosis not present

## 2015-11-09 DIAGNOSIS — I10 Essential (primary) hypertension: Secondary | ICD-10-CM | POA: Diagnosis not present

## 2015-11-09 DIAGNOSIS — R0609 Other forms of dyspnea: Secondary | ICD-10-CM | POA: Insufficient documentation

## 2015-11-09 DIAGNOSIS — E119 Type 2 diabetes mellitus without complications: Secondary | ICD-10-CM | POA: Insufficient documentation

## 2015-11-09 DIAGNOSIS — F17200 Nicotine dependence, unspecified, uncomplicated: Secondary | ICD-10-CM | POA: Diagnosis not present

## 2015-11-09 DIAGNOSIS — R079 Chest pain, unspecified: Secondary | ICD-10-CM | POA: Insufficient documentation

## 2015-11-09 DIAGNOSIS — R5383 Other fatigue: Secondary | ICD-10-CM | POA: Diagnosis not present

## 2015-11-09 DIAGNOSIS — E669 Obesity, unspecified: Secondary | ICD-10-CM | POA: Insufficient documentation

## 2015-11-09 LAB — MYOCARDIAL PERFUSION IMAGING
CHL CUP NUCLEAR SRS: 0
CHL CUP RESTING HR STRESS: 70 {beats}/min
CSEPED: 9 min
CSEPEDS: 0 s
Estimated workload: 10.1 METS
LV dias vol: 116 mL
LVSYSVOL: 56 mL
MPHR: 164 {beats}/min
NUC STRESS TID: 1.27
Peak HR: 151 {beats}/min
Percent HR: 92 %
RPE: 17
SDS: 1
SSS: 1

## 2015-11-09 MED ORDER — TECHNETIUM TC 99M SESTAMIBI GENERIC - CARDIOLITE
30.9000 | Freq: Once | INTRAVENOUS | Status: AC | PRN
  Administered 2015-11-09: 30.9 via INTRAVENOUS

## 2015-11-09 MED ORDER — TECHNETIUM TC 99M SESTAMIBI GENERIC - CARDIOLITE
10.7000 | Freq: Once | INTRAVENOUS | Status: AC | PRN
  Administered 2015-11-09: 11 via INTRAVENOUS

## 2015-12-21 ENCOUNTER — Other Ambulatory Visit: Payer: Self-pay | Admitting: Cardiology

## 2015-12-21 NOTE — Telephone Encounter (Signed)
Rx request sent to pharmacy.  

## 2015-12-25 ENCOUNTER — Telehealth: Payer: Self-pay

## 2015-12-25 ENCOUNTER — Telehealth: Payer: Self-pay | Admitting: *Deleted

## 2015-12-25 NOTE — Telephone Encounter (Signed)
Pt request refills fenofibrate and lisinopril; advised pt [name] PA refilled each med for year with [facility] on 11/02/15. Pt will ck with [facility].

## 2015-12-25 NOTE — Telephone Encounter (Signed)
PRIOR AUTHORIZATION COMPLETED  FOR BRILINTA 90 MG TWICE A DAY #60  TO  5 REFILLS  SPOKE TO [facility]- REP.-- DX I25.10,Z98.61CAD S/P PCI, ANGINA-  I20.8 APPROVED FOR 1 YEAR- PA#  [id number]  RN NOTIFIED [facility]-SPOKE TO AARON.

## 2015-12-28 ENCOUNTER — Telehealth: Payer: Self-pay

## 2015-12-28 NOTE — Telephone Encounter (Signed)
Patient called in about refills for Lisinopril and Fenofibrate that were sent in on 11/02/2015-pt stated he has not recieved the medications yet. I advised he call [facility] and speak with them because it was confirmed in epic they recieved approved refills. Explained to patient to call us back if he still needed help after speaking with pharmacy. He expressed his understanding.

## 2015-12-29 ENCOUNTER — Telehealth: Payer: Self-pay | Admitting: *Deleted

## 2015-12-29 MED ORDER — FENOFIBRATE 160 MG PO TABS: 160.0000 mg | ORAL_TABLET | Freq: Every day | ORAL | Status: DC

## 2015-12-29 MED ORDER — ATORVASTATIN CALCIUM 40 MG PO TABS: 40.0000 mg | ORAL_TABLET | Freq: Every day | ORAL | Status: DC

## 2015-12-29 MED ORDER — LISINOPRIL 2.5 MG PO TABS: ORAL_TABLET | ORAL | Status: DC

## 2015-12-29 NOTE — Telephone Encounter (Signed)
Patients wife called and stated that for some reason [facility] deleted the rx's for fenofibrate, lisinopril and atorvastatin.

## 2016-01-09 ENCOUNTER — Telehealth: Payer: Self-pay | Admitting: *Deleted

## 2016-01-09 ENCOUNTER — Other Ambulatory Visit: Payer: Self-pay | Admitting: Family Medicine

## 2016-01-09 MED ORDER — METOPROLOL TARTRATE 25 MG PO TABS: 12.5000 mg | ORAL_TABLET | Freq: Two times a day (BID) | ORAL | Status: DC

## 2016-01-09 NOTE — Telephone Encounter (Signed)
RECEIVED MESSAGE PATIENT NEEDS MEDICATION T MAIL ORDER METOPROLOL. E-SENT TO MAIL ORDER LEFT MESSAGE ON WIFE'S WORK NUMBER THE REQUEST COMPLETED.

## 2016-01-28 ENCOUNTER — Other Ambulatory Visit: Payer: Self-pay | Admitting: Family Medicine

## 2016-01-28 DIAGNOSIS — E1149 Type 2 diabetes mellitus with other diabetic neurological complication: Secondary | ICD-10-CM

## 2016-01-30 ENCOUNTER — Other Ambulatory Visit (INDEPENDENT_AMBULATORY_CARE_PROVIDER_SITE_OTHER): Payer: 59

## 2016-01-30 DIAGNOSIS — E1149 Type 2 diabetes mellitus with other diabetic neurological complication: Secondary | ICD-10-CM

## 2016-01-30 LAB — HEMOGLOBIN A1C: Hgb A1c MFr Bld: 7.8 % — ABNORMAL HIGH (ref 4.6–6.5)

## 2016-02-02 ENCOUNTER — Ambulatory Visit (INDEPENDENT_AMBULATORY_CARE_PROVIDER_SITE_OTHER): Payer: 59 | Admitting: Family Medicine

## 2016-02-02 ENCOUNTER — Encounter: Payer: Self-pay | Admitting: Family Medicine

## 2016-02-02 VITALS — BP 116/70 | HR 74 | Temp 98.0°F | Wt 214.2 lb

## 2016-02-02 DIAGNOSIS — E1149 Type 2 diabetes mellitus with other diabetic neurological complication: Secondary | ICD-10-CM

## 2016-02-02 NOTE — Patient Instructions (Signed)
Ice your knee, 5 min on then 5 min off.  Use a knee sleeve.   If not better, then call ortho.  Let me know if you need a referral.  Recheck A1c in about 3-4 months.   Keep working on diet in the meantime.   Take care.  Glad to see you.

## 2016-02-02 NOTE — Progress Notes (Signed)
Pre visit review using our clinic review tool, if applicable. No additional management support is needed unless otherwise documented below in the visit note.  Diabetes:  Using medications without difficulties: yes Hypoglycemic episodes: no Hyperglycemic episodes: no Feet problems: tingling is at baseline, maybe some better.   Blood Sugars averaging: not checked.  eye exam within last year: due, d/w pt.   He is working on his weight in the meantime.  His diet is better.  His pants are fitting looser.   His R knee is more painful after his stress test.  That is limiting exercise.   A1c d/w pt.    R knee not popping or clicking but pain with ROM, esp flexion > 90 deg.  Posterior pain.  No radicular pain.  H/o knee surgery noted.  Hasn't tried brace/wrap/etc.  Hasn't tried ice/heat.  Some days are better than others.    Meds, vitals, and allergies reviewed.   ROS: See HPI.  Otherwise negative.    GEN: nad, alert and oriented NECK: supple w/o LA CV: rrr. PULM: ctab, no inc wob ABD: soft, +bs EXT: no edema R knee normal ROM, ACL MCL LCL feel solid.  No meniscal click.  Joint lines not ttp.

## 2016-02-02 NOTE — Assessment & Plan Note (Signed)
Exercise is the main issue here, limited by R knee pain.  D/w pt. No change in meds.  Would ice the knee, use a knee sleeve, and if not better, then f/u with ortho.  He agrees.   See AVS.  Recheck in a few months.  No new DM2 meds. Continue work on diet.

## 2016-02-27 ENCOUNTER — Ambulatory Visit (INDEPENDENT_AMBULATORY_CARE_PROVIDER_SITE_OTHER): Payer: 59 | Admitting: Internal Medicine

## 2016-02-27 ENCOUNTER — Encounter: Payer: Self-pay | Admitting: Internal Medicine

## 2016-02-27 VITALS — BP 133/66 | HR 74 | Temp 98.3°F | Wt 215.1 lb

## 2016-02-27 DIAGNOSIS — M62838 Other muscle spasm: Secondary | ICD-10-CM | POA: Insufficient documentation

## 2016-02-27 DIAGNOSIS — M6248 Contracture of muscle, other site: Secondary | ICD-10-CM | POA: Diagnosis not present

## 2016-02-27 MED ORDER — CYCLOBENZAPRINE HCL 5 MG PO TABS: 5.0000 mg | ORAL_TABLET | Freq: Three times a day (TID) | ORAL | Status: DC | PRN

## 2016-02-27 NOTE — Assessment & Plan Note (Signed)
Right trapezius spasm/strain causing neck and shoulder pain. Discussed potential risks of NSAIDS given use of Brilinta. Will start Flexeril 5-10mg  po tidprn. He will follow up by email tomorrow. Continue to ice area. Discussed adding Tramadol if no improvement with Flexeril or if this medication is too sedating.

## 2016-02-27 NOTE — Progress Notes (Signed)
Pre visit review using our clinic review tool, if applicable. No additional management support is needed unless otherwise documented below in the visit note. 

## 2016-02-27 NOTE — Patient Instructions (Signed)
Start Flexeril 5-10mg  up to three times daily for neck pain.  Continue to ice right neck and shoulder 3-4 times daily.  Email with update tomorrow.

## 2016-02-27 NOTE — Progress Notes (Signed)
Subjective:    Patient ID: [name] [name], male    DOB: 1961-10-01, 55 y.o.   MRN: [id number]  HPI  55YO male presents for acute visit.  Neck pain - Started Wed or Thur last week, like pain from "sleeping wrong." A "crick" in the neck. Tried massage and ice with no improvement. Located on right side of neck into shoulder.Aching pain. Does not radiate. Made worse with moving arm and head. Improved with staying still. Unable to sleep last night because of neck pain. No new activities except for building new porch this past weekend.   Wt Readings from Last 3 Encounters:  02/27/16 215 lb 2 oz (97.58 kg)  02/02/16 214 lb 4 oz (97.183 kg)  11/09/15 215 lb (97.523 kg)   BP Readings from Last 3 Encounters:  02/27/16 133/66  02/02/16 116/70  11/02/15 120/78    Past Medical History  Diagnosis Date  . Allergic rhinitis   . Fracture of right hand 1983  . Paresthesia     tingling in feet  . Hyperlipidemia   . Nocturia   . Hypertension   . Diabetes mellitus     type2  . STEMI (ST elevation myocardial infarction) of inf. wall 12/03/2013  . CAD S/P percutaneous coronary angioplasty 12/03/2013    PCI-RCA - Xience Alpine DES 3.0 mm x 18 mm --> 3.5 mm; Echo 1/'15" EF 45-50%, mild C LVH, mild Inf HK, Gr 1 DD   Family History  Problem Relation Age of Onset  . Cancer Mother     ovarian  . Hypertension Father   . Hyperlipidemia Father   . Diabetes Father     lost 60 pounds and off all meds  . Cancer Father     bladder- Kidney cancer  . Hearing loss Brother   . Heart disease Paternal Grandfather     MI  . Colon cancer Neg Hx   . Prostate cancer Neg Hx    Past Surgical History  Procedure Laterality Date  . Septoplasty  1970    due to Fx Nose  . Knee surgery      B arthroscopic  . Percutaneous coronary stent intervention (pci-s)  12/03/2013    mRCA - Xience Alpine DES 3.0 mm x 18 mm --> 3.5 mm  . Transthoracic echocardiogram  12/03/2013    Mild Conc LVH; EF 45-50%, Mild  Inferior HK. Grade 1 DD  . Left heart catheterization with coronary angiogram N/A 12/03/2013    Procedure: LEFT HEART CATHETERIZATION WITH CORONARY ANGIOGRAM;  Surgeon: Leonie Man, MD;  Location: [facility] CATH LAB;  Service: Cardiovascular;  Laterality: N/A;   Social History   Social History  . Marital Status: Married    Spouse Name: N/A  . Number of Children: 1  . Years of Education: N/A   Occupational History  . Operations Automotive engineer   Social History Main Topics  . Smoking status: Former Smoker -- 0.50 packs/day for 20 years    Types: Cigarettes    Quit date: 12/03/2013  . Smokeless tobacco: Never Used  . Alcohol Use: Yes     Comment: occ on weekends  . Drug Use: No  . Sexual Activity: Not Asked   Other Topics Concern  . None   Social History Narrative   Married 1988 and lives with wife   One son   Works at Museum/gallery exhibitions officer, Librarian, academic.   Forward 10 pack year smoker, who quit on the day  of his MI.    Review of Systems  Constitutional: Negative for fever, chills, activity change, appetite change, fatigue and unexpected weight change.  Eyes: Negative for visual disturbance.  Respiratory: Negative for cough and shortness of breath.   Cardiovascular: Negative for chest pain, palpitations and leg swelling.  Gastrointestinal: Negative for abdominal pain and abdominal distention.  Genitourinary: Negative for dysuria, urgency and difficulty urinating.  Musculoskeletal: Positive for myalgias, back pain, arthralgias, neck pain and neck stiffness. Negative for gait problem.  Skin: Negative for color change and rash.  Hematological: Negative for adenopathy.  Psychiatric/Behavioral: Negative for sleep disturbance and dysphoric mood. The patient is not nervous/anxious.        Objective:    BP 133/66 mmHg  Pulse 74  Temp(Src) 98.3 F (36.8 C) (Oral)  Wt 215 lb 2 oz (97.58 kg)  SpO2 96% Physical Exam  Constitutional: He is oriented to  person, place, and time. He appears well-developed and well-nourished. No distress.  HENT:  Head: Normocephalic and atraumatic.  Right Ear: External ear normal.  Left Ear: External ear normal.  Nose: Nose normal.  Mouth/Throat: Oropharynx is clear and moist.  Eyes: Conjunctivae and EOM are normal. Pupils are equal, round, and reactive to light. Right eye exhibits no discharge. Left eye exhibits no discharge. No scleral icterus.  Neck: Normal range of motion. Neck supple. Muscular tenderness (over trapezius, with palpable spasm, right neck to shoulder) present. No spinous process tenderness present. No rigidity. No tracheal deviation and no edema present. No thyromegaly present.  Cardiovascular: Normal rate, regular rhythm and normal heart sounds.  Exam reveals no gallop and no friction rub.   No murmur heard. Pulmonary/Chest: Effort normal and breath sounds normal. No accessory muscle usage. No tachypnea. No respiratory distress. He has no decreased breath sounds. He has no wheezes. He has no rhonchi. He has no rales. He exhibits no tenderness.  Musculoskeletal: Normal range of motion. He exhibits no edema.  Lymphadenopathy:    He has no cervical adenopathy.  Neurological: He is alert and oriented to person, place, and time. No cranial nerve deficit. Coordination normal.  Skin: Skin is warm and dry. No rash noted. He is not diaphoretic. No erythema. No pallor.  Psychiatric: He has a normal mood and affect. His behavior is normal. Judgment and thought content normal.          Assessment & Plan:   Problem List Items Addressed This Visit      Unprioritized   Trapezius muscle spasm - Primary    Right trapezius spasm/strain causing neck and shoulder pain. Discussed potential risks of NSAIDS given use of Brilinta. Will start Flexeril 5-10mg  po tidprn. He will follow up by email tomorrow. Continue to ice area. Discussed adding Tramadol if no improvement with Flexeril or if this medication is  too sedating.      Relevant Medications   cyclobenzaprine (FLEXERIL) 5 MG tablet       Return if symptoms worsen or fail to improve.  Ronette Deter, MD Internal Medicine [facility]

## 2016-04-30 ENCOUNTER — Other Ambulatory Visit: Payer: Self-pay | Admitting: Family Medicine

## 2016-04-30 DIAGNOSIS — E1149 Type 2 diabetes mellitus with other diabetic neurological complication: Secondary | ICD-10-CM

## 2016-05-03 ENCOUNTER — Other Ambulatory Visit (INDEPENDENT_AMBULATORY_CARE_PROVIDER_SITE_OTHER): Payer: 59

## 2016-05-03 DIAGNOSIS — E1149 Type 2 diabetes mellitus with other diabetic neurological complication: Secondary | ICD-10-CM

## 2016-05-03 LAB — HEMOGLOBIN A1C: HEMOGLOBIN A1C: 7.6 % — AB (ref 4.6–6.5)

## 2016-05-10 ENCOUNTER — Ambulatory Visit: Payer: 59 | Admitting: Family Medicine

## 2016-05-17 ENCOUNTER — Ambulatory Visit (INDEPENDENT_AMBULATORY_CARE_PROVIDER_SITE_OTHER): Payer: 59 | Admitting: Family Medicine

## 2016-05-17 ENCOUNTER — Encounter: Payer: Self-pay | Admitting: Family Medicine

## 2016-05-17 VITALS — BP 112/78 | HR 66 | Temp 98.4°F | Wt 209.2 lb

## 2016-05-17 DIAGNOSIS — E1149 Type 2 diabetes mellitus with other diabetic neurological complication: Secondary | ICD-10-CM | POA: Diagnosis not present

## 2016-05-17 NOTE — Patient Instructions (Addendum)
Call about an eye exam.   Recheck labs before a visit in about 3 months.   Physical in about 6 months.   If the foot pain is worse then let me know.   Take care.  Glad to see you.

## 2016-05-17 NOTE — Assessment & Plan Note (Signed)
No change in meds.  He'll work on diet and exercise in the meantime.  Weight loss noted.  A1c some better.  Declined gabapentin at this point.  I encouraged patient re: diet and exercise.  He'll call about eye exam.  Recheck in about 3 months.  Continue foot care- normal skin on exam today.  He agrees.

## 2016-05-17 NOTE — Progress Notes (Signed)
Pre visit review using our clinic review tool, if applicable. No additional management support is needed unless otherwise documented below in the visit note.  Diabetes:  Using medications without difficulties: yes Hypoglycemic episodes:no sx  Hyperglycemic episodes: no sx Feet problems: still with burning on the feet w/o skin breakdown.  D/w pt about treatment options, ie gabapentin.  Blood Sugars averaging:not checked eye exam within last year: due, d/w pt. Now with L knee pain, with some improvement recently  A1c 7.6.  D/w pt.  Slightly better.   Meds, vitals, and allergies reviewed.   ROS: Per HPI unless specifically indicated in ROS section   GEN: nad, alert and oriented HEENT: mucous membranes moist NECK: supple w/o LA CV: rrr. PULM: ctab, no inc wob ABD: soft, +bs EXT: no edema  Diabetic foot exam: Normal inspection No skin breakdown No calluses  Normal DP pulses Normal sensation to light touch and monofilament but with pain reported.  Nails normal

## 2016-06-11 ENCOUNTER — Encounter: Payer: Self-pay | Admitting: Family Medicine

## 2016-06-18 ENCOUNTER — Other Ambulatory Visit: Payer: Self-pay | Admitting: Family Medicine

## 2016-06-19 NOTE — Telephone Encounter (Signed)
Received refill request electronically Last office visit 05/17/16 See allergy/contraindication Okay to refill?

## 2016-06-19 NOTE — Telephone Encounter (Signed)
Sent. Thanks.   

## 2016-07-03 ENCOUNTER — Other Ambulatory Visit: Payer: Self-pay | Admitting: Cardiology

## 2016-07-03 NOTE — Telephone Encounter (Signed)
Rx request sent to pharmacy.  

## 2016-08-13 ENCOUNTER — Other Ambulatory Visit: Payer: Self-pay | Admitting: *Deleted

## 2016-08-13 MED ORDER — TICAGRELOR 90 MG PO TABS: 90.0000 mg | ORAL_TABLET | Freq: Two times a day (BID) | ORAL | 1 refills | Status: DC

## 2016-08-16 ENCOUNTER — Other Ambulatory Visit: Payer: 59

## 2016-08-16 ENCOUNTER — Ambulatory Visit: Payer: 59 | Admitting: Family Medicine

## 2016-08-30 ENCOUNTER — Encounter: Payer: Self-pay | Admitting: Family Medicine

## 2016-08-30 ENCOUNTER — Other Ambulatory Visit: Payer: Self-pay | Admitting: Family Medicine

## 2016-08-30 DIAGNOSIS — E1149 Type 2 diabetes mellitus with other diabetic neurological complication: Secondary | ICD-10-CM

## 2016-09-02 ENCOUNTER — Ambulatory Visit: Payer: 59 | Admitting: Family Medicine

## 2016-09-04 ENCOUNTER — Other Ambulatory Visit (INDEPENDENT_AMBULATORY_CARE_PROVIDER_SITE_OTHER): Payer: 59

## 2016-09-04 DIAGNOSIS — E1149 Type 2 diabetes mellitus with other diabetic neurological complication: Secondary | ICD-10-CM | POA: Diagnosis not present

## 2016-09-04 LAB — LDL CHOLESTEROL, DIRECT: Direct LDL: 100 mg/dL

## 2016-09-04 LAB — LIPID PANEL
CHOL/HDL RATIO: 6
CHOLESTEROL: 207 mg/dL — AB (ref 0–200)
HDL: 36.2 mg/dL — ABNORMAL LOW (ref >39.00)
Triglycerides: 432 mg/dL — ABNORMAL HIGH (ref 0.0–149.0)

## 2016-09-04 LAB — COMPREHENSIVE METABOLIC PANEL
ALBUMIN: 4.4 g/dL (ref 3.5–5.2)
ALK PHOS: 36 U/L — AB (ref 39–117)
ALT: 30 U/L (ref 0–53)
AST: 19 U/L (ref 0–37)
BILIRUBIN TOTAL: 0.5 mg/dL (ref 0.2–1.2)
BUN: 21 mg/dL (ref 6–23)
CALCIUM: 9.5 mg/dL (ref 8.4–10.5)
CHLORIDE: 104 meq/L (ref 96–112)
CO2: 26 mEq/L (ref 19–32)
CREATININE: 0.9 mg/dL (ref 0.40–1.50)
GFR: 92.5 mL/min (ref >60.00)
Glucose, Bld: 186 mg/dL — ABNORMAL HIGH (ref 70–99)
Potassium: 4.5 mEq/L (ref 3.5–5.1)
Sodium: 140 mEq/L (ref 135–145)
TOTAL PROTEIN: 7.4 g/dL (ref 6.0–8.3)

## 2016-09-04 LAB — HEMOGLOBIN A1C: HEMOGLOBIN A1C: 7.7 % — AB (ref 4.6–6.5)

## 2016-09-06 ENCOUNTER — Encounter: Payer: Self-pay | Admitting: Family Medicine

## 2016-09-06 ENCOUNTER — Ambulatory Visit (INDEPENDENT_AMBULATORY_CARE_PROVIDER_SITE_OTHER): Payer: 59 | Admitting: Family Medicine

## 2016-09-06 VITALS — BP 106/70 | HR 76 | Temp 98.5°F | Wt 212.0 lb

## 2016-09-06 DIAGNOSIS — Z23 Encounter for immunization: Secondary | ICD-10-CM

## 2016-09-06 DIAGNOSIS — R55 Syncope and collapse: Secondary | ICD-10-CM

## 2016-09-06 DIAGNOSIS — E1149 Type 2 diabetes mellitus with other diabetic neurological complication: Secondary | ICD-10-CM | POA: Diagnosis not present

## 2016-09-06 NOTE — Progress Notes (Signed)
Pre visit review using our clinic review tool, if applicable. No additional management support is needed unless otherwise documented below in the visit note. 

## 2016-09-06 NOTE — Progress Notes (Signed)
Diabetes:  Using medications without difficulties: yes Hypoglycemic episodes:no Hyperglycemic episodes:no Feet problems: at baseline.   Blood Sugars averaging: usually ~170 in the AM, lower as the day goes on eye exam within last year: due, d/w pt.   Had been drinking protein drink at work midmorning and it wasn't elevating his sugar on post recheck.   A1c and labs d/w pt.   Flu shot today.    He had an episode of near but not full syncope.  It was last week, none since then.  Lasted a few seconds, no LOC.  No CP, SOB, BLE edema.  He didn't have time to check his sugar before it resolved, since it was so brief.  No residual sx.  Has gone years prev w/o sx.  The room didn't spin.  He didn't recall being fasting at the time of the event.  We talked about trying to avoid low sugars.    Meds, vitals, and allergies reviewed.   ROS: Per HPI unless specifically indicated in ROS section   GEN: nad, alert and oriented HEENT: mucous membranes moist NECK: supple w/o LA CV: rrr. PULM: ctab, no inc wob ABD: soft, +bs EXT: no edema

## 2016-09-06 NOTE — Patient Instructions (Addendum)
Call the heart clinic about follow up.  Avoid fasting in the meantime.   Take care.  Glad to see you.  Recheck A1c in about 3 months.  If you have low sugars, then let me know.  Update me as needed.

## 2016-09-07 DIAGNOSIS — R55 Syncope and collapse: Secondary | ICD-10-CM | POA: Insufficient documentation

## 2016-09-07 NOTE — Assessment & Plan Note (Signed)
This was incidentally mentioned about the patient. He had no antecedent symptoms. The episode was exceedingly brief, lasting only a few seconds. He did not pass out. No symptoms in the meantime. He had not had similar episodes in the year prior. He does recall an episode of near syncope years ago. I want him to talk with cardiology in the meantime. He agrees.

## 2016-09-07 NOTE — Assessment & Plan Note (Signed)
Continue current medications as is. Avoid fasting. Discussed with patient about trying to avoid low sugars. It is not clear to me that he recently had a low sugar, because the episode of near-syncope. He did not pass out. In any event, I do not want to induce hypoglycemia. We will have him see cardiology and then go from there. Recheck A1c in a few months. He agrees.

## 2016-10-28 ENCOUNTER — Other Ambulatory Visit: Payer: Self-pay | Admitting: Cardiology

## 2016-10-28 NOTE — Telephone Encounter (Signed)
REFILL 

## 2016-11-15 ENCOUNTER — Other Ambulatory Visit: Payer: Self-pay | Admitting: Cardiology

## 2016-11-15 NOTE — Telephone Encounter (Signed)
Rx(s) sent to pharmacy electronically.  

## 2016-11-24 ENCOUNTER — Other Ambulatory Visit: Payer: Self-pay | Admitting: Cardiology

## 2016-11-26 ENCOUNTER — Other Ambulatory Visit: Payer: Self-pay | Admitting: Family Medicine

## 2016-11-26 DIAGNOSIS — E1149 Type 2 diabetes mellitus with other diabetic neurological complication: Principal | ICD-10-CM

## 2016-11-26 DIAGNOSIS — E1140 Type 2 diabetes mellitus with diabetic neuropathy, unspecified: Secondary | ICD-10-CM

## 2016-11-26 NOTE — Telephone Encounter (Signed)
REFILL 

## 2016-11-28 ENCOUNTER — Other Ambulatory Visit: Payer: Self-pay | Admitting: Cardiology

## 2016-11-28 NOTE — Telephone Encounter (Signed)
Pt need office visit in order for medications to be refill through [facility], pt seen last in 2015, 30 days can be send into pt Local pharmacy once appt have been made

## 2016-11-29 ENCOUNTER — Other Ambulatory Visit: Payer: Self-pay | Admitting: Cardiology

## 2016-12-03 NOTE — Telephone Encounter (Signed)
Rx has been sent to the pharmacy electronically. ° °

## 2016-12-05 ENCOUNTER — Encounter: Payer: Self-pay | Admitting: Family Medicine

## 2016-12-05 ENCOUNTER — Other Ambulatory Visit: Payer: 59

## 2016-12-05 ENCOUNTER — Ambulatory Visit (INDEPENDENT_AMBULATORY_CARE_PROVIDER_SITE_OTHER): Payer: 59 | Admitting: Family Medicine

## 2016-12-05 VITALS — BP 112/70 | HR 105 | Temp 98.6°F | Ht 70.0 in | Wt 210.0 lb

## 2016-12-05 DIAGNOSIS — J029 Acute pharyngitis, unspecified: Secondary | ICD-10-CM | POA: Diagnosis not present

## 2016-12-05 DIAGNOSIS — J00 Acute nasopharyngitis [common cold]: Secondary | ICD-10-CM

## 2016-12-05 LAB — POCT RAPID STREP A (OFFICE): RAPID STREP A SCREEN: NEGATIVE

## 2016-12-05 NOTE — Progress Notes (Signed)
Pre visit review using our clinic review tool, if applicable. No additional management support is needed unless otherwise documented below in the visit note. 

## 2016-12-05 NOTE — Progress Notes (Signed)
Dr. Frederico Hamman T. Masiyah Engen, MD, [facility] [place], [address] Phone: [phone] Fax: [phone]  12/05/2016  Patient: [name] [name], MRN: [id number], DOB: 1960-12-11, 56 y.o.  Primary Physician:  Elsie Stain, MD   Chief Complaint  Patient presents with  . Sore Throat    since Saturday  . Nasal Congestion  . Cough   Subjective:   This 56 y.o. male patient presents with runny nose, sneezing, cough, sore throat, malaise and minimal / low-grade fever .  ST since Sat.  Now has a cough and rough of his mouth is sore.  Blowing out blood from his nose.   ? recent exposure to others with similar symptoms.   The patent denies sore throat as the primary complaint. Denies sthortness of breath/wheezing, high fever, chest pain, rhinits for more than 14 days, significant myalgia, otalgia, facial pain, abdominal pain, changes in bowel or bladder.  PMH, PHS, Allergies, Problem List, Medications, Family History, and Social History have all been reviewed.  Patient Active Problem List   Diagnosis Date Noted  . Near syncope 09/07/2016  . Trapezius muscle spasm 02/27/2016  . Advance care planning 09/28/2014  . Fatigue 04/24/2014  . Essential hypertension 01/27/2014  . CAD S/P percutaneous coronary angioplasty - Promus Premier DES 3.0 mm x 18 mm (3.5 mm) to mid RCA occlusion; no significant left-sided disease. 12/03/2013    Class: Acute  . Presence of drug coated stent in right coronary artery: Promus Premier DES 3.0 mm x 18 mm (3.5 mm) to mid RCA occlusion 12/03/12 12/03/2013    Class: Diagnosis of  . Former light tobacco smoker 12/03/2013  . STEMI (ST elevation myocardial infarction) of inf. wall 12/03/2013  . Skin lesion 04/14/2013  . Rash and nonspecific skin eruption 02/16/2013  . Routine general medical examination at a health care facility 05/21/2012  . Obesity 01/28/2012  . PLANTAR FASCIITIS, LEFT 12/14/2008  .  HYPERLIPIDEMIA, MIXED -- significant hypertriglyceridemia 06/28/2008  . GERD 06/28/2008  . OTHER URETHRITIS 06/17/2008  . SEBACEOUS CYST 06/17/2008  . Diabetes mellitus type 2 with neurological manifestations ([place]) 03/17/2005    Past Medical History:  Diagnosis Date  . Allergic rhinitis   . CAD S/P percutaneous coronary angioplasty 12/03/2013   PCI-RCA - Xience Alpine DES 3.0 mm x 18 mm --> 3.5 mm; Echo 1/'15" EF 45-50%, mild C LVH, mild Inf HK, Gr 1 DD  . Diabetes mellitus    type2  . Fracture of right hand 1983  . Hyperlipidemia   . Hypertension   . Nocturia   . Paresthesia    tingling in feet  . STEMI (ST elevation myocardial infarction) of inf. wall 12/03/2013    Past Surgical History:  Procedure Laterality Date  . KNEE SURGERY     B arthroscopic  . LEFT HEART CATHETERIZATION WITH CORONARY ANGIOGRAM N/A 12/03/2013   Procedure: LEFT HEART CATHETERIZATION WITH CORONARY ANGIOGRAM;  Surgeon: Leonie Man, MD;  Location: [facility] CATH LAB;  Service: Cardiovascular;  Laterality: N/A;  . PERCUTANEOUS CORONARY STENT INTERVENTION (PCI-S)  12/03/2013   mRCA - Xience Alpine DES 3.0 mm x 18 mm --> 3.5 mm  . SEPTOPLASTY  1970   due to Fx Nose  . TRANSTHORACIC ECHOCARDIOGRAM  12/03/2013   Mild Conc LVH; EF 45-50%, Mild Inferior HK. Grade 1 DD    Social History   Social History  . Marital status: Married    Spouse name: N/A  . Number  of children: 1  . 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 Smoking status: Former Smoker    Packs/day: 0.50    Years: 20.00    Types: Cigarettes    Quit date: 12/03/2013  . Smokeless tobacco: Never Used  . Alcohol use Yes     Comment: occ on weekends  . Drug use: No  . Sexual activity: Not on file   Other Topics Concern  . Not on file   Social History Narrative   Married 1988 and lives with wife   One son   Works at [employer] manager, Librarian, academic.   Forward 10 pack year smoker, who quit on the day of his MI.    Family History  Problem Relation Age of Onset  . Cancer Mother     ovarian  . Hypertension Father   . Hyperlipidemia Father   . Diabetes Father     lost 60 pounds and off all meds  . Cancer Father     bladder- Kidney cancer  . Hearing loss Brother   . Heart disease Paternal Grandfather     MI  . Colon cancer Neg Hx   . Prostate cancer Neg Hx     Allergies  Allergen Reactions  . Metformin And Related     Intolerant of more than 1000mg  a day    Medication list reviewed and updated in full in [place].  ROS as above, eating and drinking - tolerating PO. Urinating normally. No excessive vomitting or diarrhea. O/w as above.  Objective:   Blood pressure 112/70, pulse (!) 105, temperature 98.6 F (37 C), temperature source Oral, height 5\' 10"  (1.778 m), weight 210 lb (95.3 kg), SpO2 94 %.  GEN: WDWN, Non-toxic, Atraumatic, normocephalic. A and O x 3. HEENT: Oropharynx clear without exudate, MMM, no significant LAD, mild rhinnorhea Ears: TM clear, COL visualized with good landmarks CV: RRR, no m/g/r. Pulm: CTA B, no wheezes, rhonchi, or crackles, normal respiratory effort. EXT: no c/c/e Psych: well oriented, neither depressed nor anxious in appearance  Objective Data: Results for orders placed or performed in visit on 12/05/16  POCT rapid strep A  Result Value Ref Range   Rapid Strep A Screen Negative Negative    Assessment and Plan:   Acute nasopharyngitis  Sore throat - Plan: POCT rapid strep A  Supportive care reviewed with patient. See patient instruction section.  Follow-up: No Follow-up on file.  Orders Placed This Encounter  Procedures  . POCT rapid strep A    Signed,  Jewels Langone T. Legaci Tarman, MD   Patient's Medications  New Prescriptions   No medications on file  Previous Medications   ATORVASTATIN (LIPITOR) 40 MG TABLET    TAKE 1 TABLET BY MOUTH  DAILY     BRILINTA 90 MG TABS TABLET    TAKE 1 TABLET (90 MG TOTAL) BY MOUTH 2 (TWO) TIMES DAILY.   CYCLOBENZAPRINE (FLEXERIL) 5 MG TABLET    Take 1-2 tablets (5-10 mg total) by mouth 3 (three) times daily as needed for muscle spasms.   FENOFIBRATE 160 MG TABLET    TAKE 1 TABLET BY MOUTH  DAILY   FOLIC ACID (CVS FOLIC ACID) A999333 MCG TABLET    Take 1 tablet (400 mcg total) by mouth daily.   GLIPIZIDE (GLUCOTROL) 5 MG TABLET    Take 5 mg by mouth daily  before breakfast.   LISINOPRIL (PRINIVIL,ZESTRIL) 2.5 MG TABLET    TAKE 1 TABLET BY MOUTH  DAILY   METFORMIN (GLUCOPHAGE) 500 MG TABLET    Take 1 tablet by mouth  twice a day with meals   METOPROLOL TARTRATE (LOPRESSOR) 25 MG TABLET    TAKE ONE-HALF TABLET BY  MOUTH TWO TIMES DAILY   NITROSTAT 0.4 MG SL TABLET    DISSOLVE 1 TABLET UNDER THE TONGUE FOR CHEST PAIN. MAY REPEAT EVERY 5MINUTES UP TO 3 DOSES. IF NO RELIEF, CALL 911**   OMEGA-3 KRILL OIL 300 MG CAPS    Take 1 capsule by mouth daily.    VITAMIN B-12 (CYANOCOBALAMIN) 1000 MCG TABLET    Take 1 tablet (1,000 mcg total) by mouth daily.   VITAMIN B-6 (PYRIDOXINE) 25 MG TABLET    Take 1 tablet (25 mg total) by mouth daily.  Modified Medications   No medications on file  Discontinued Medications   No medications on file

## 2016-12-09 ENCOUNTER — Ambulatory Visit: Payer: 59 | Admitting: Family Medicine

## 2017-01-02 LAB — HM DIABETES EYE EXAM

## 2017-01-07 LAB — HM DIABETES EYE EXAM

## 2017-01-09 ENCOUNTER — Ambulatory Visit (INDEPENDENT_AMBULATORY_CARE_PROVIDER_SITE_OTHER): Payer: 59 | Admitting: Cardiology

## 2017-01-09 ENCOUNTER — Encounter: Payer: Self-pay | Admitting: Cardiology

## 2017-01-09 VITALS — BP 137/79 | HR 88 | Ht 70.5 in | Wt 215.0 lb

## 2017-01-09 DIAGNOSIS — Z9861 Coronary angioplasty status: Secondary | ICD-10-CM

## 2017-01-09 DIAGNOSIS — E782 Mixed hyperlipidemia: Secondary | ICD-10-CM

## 2017-01-09 DIAGNOSIS — E6609 Other obesity due to excess calories: Secondary | ICD-10-CM

## 2017-01-09 DIAGNOSIS — I10 Essential (primary) hypertension: Secondary | ICD-10-CM

## 2017-01-09 DIAGNOSIS — I2510 Atherosclerotic heart disease of native coronary artery without angina pectoris: Secondary | ICD-10-CM | POA: Diagnosis not present

## 2017-01-09 DIAGNOSIS — R55 Syncope and collapse: Secondary | ICD-10-CM

## 2017-01-09 NOTE — Progress Notes (Signed)
PCP: Elsie Stain, MD  Clinic Note: Chief Complaint  Patient presents with  . Follow-up    1 YEAR; Pt sttes no SX.   Marland Kitchen Coronary Artery Disease    History of STEMI-PCI  . Near Syncope    HPI: [name] [name] is a 56 y.o. male with a PMH below who presents today for Essentially 2 year follow-up for CAD.. In January 2015 had an inferior STEMI with PCI to the RCA using a Xience DES stent. I last saw him in December 2015.In addition to his CAD, he most notably has dyslipidemia with hypertriglyceridemia. He has diabetes that has been moderately controlled.  He saw [place] in Dec 2016.  At that time he was noted some jaw pain that was concerning for possible angina. He had a stress test that was negative for ischemia as noted below.  Recent Hospitalizations: None  Studies Reviewed:   Myoview 11/2015: Low risk stress test. Normal perfusion. Normal pump function. EF 50-55%  Echo 11/2014: EF 55-60%.  Normal wall motion. Normal valve function.  Interval History: [name] [name] presents today doing overall pretty well from a cardiac standpoint. The main thing he has noticed is having occasional episodes where he feels as though he may pass out. These usually occur when he is driving or having to concentrate. They don't last very long and he usually snaps out of it. He has not lost consciousness. He denies any associated rapid or irregular heartbeats associated with it. It doesn't happen when he is exerting himself. They may occur up to twice a month but have usually been 1 every couple months. He hasn't had an episode in a couple months. He is troubled a lot by arthritis type pains but is much as he does for his allergies, he is reluctant to take any medications. He prefers to "suffer through it. "  From A streaky cardiac standpoint besides his near syncopal episodes, he really has been stable with no recurrent anginal type chest pain/pressure or dyspnea with  with rest or exertion. No  PND, orthopnea or edema.  No palpitations or TIA/amaurosis fugax symptoms. No melena, hematochezia, hematuria, or epstaxis. No claudication.  ROS: A comprehensive was performed. Review of Systems  Constitutional: Negative for malaise/fatigue.  HENT: Positive for congestion (Allergy related) and sinus pain (Allergy related).   Respiratory: Positive for cough (From allergies). Negative for shortness of breath and wheezing.   Gastrointestinal: Negative for abdominal pain, constipation and heartburn.  Musculoskeletal: Positive for joint pain.  Skin: Negative.   Neurological: Positive for dizziness.       Near syncopal episodes as noted above  Endo/Heme/Allergies: Positive for environmental allergies. Does not bruise/bleed easily.  Psychiatric/Behavioral: Negative for depression and memory loss. The patient is not nervous/anxious and does not have insomnia.   All other systems reviewed and are negative.    Past Medical History:  Diagnosis Date  . Allergic rhinitis   . CAD S/P percutaneous coronary angioplasty 12/03/2013   PCI-RCA - Xience Alpine DES 3.0 mm x 18 mm --> 3.5 mm; Echo 1/'15" EF 45-50%, mild C LVH, mild Inf HK, Gr 1 DD  . Diabetes mellitus    type2  . Fracture of right hand 1983  . Hyperlipidemia   . Hypertension   . Nocturia   . Paresthesia    tingling in feet  . STEMI (ST elevation myocardial infarction) of inf. wall 12/03/2013    Past Surgical History:  Procedure Laterality Date  . KNEE SURGERY  B arthroscopic  . LEFT HEART CATHETERIZATION WITH CORONARY ANGIOGRAM N/A 12/03/2013   Procedure: LEFT HEART CATHETERIZATION WITH CORONARY ANGIOGRAM;  Surgeon: Leonie Man, MD;  Location: [facility] CATH LAB;  Service: Cardiovascular;  Laterality: N/A;  . NM MYOVIEW LTD  11/2015   Low risk stress test. Normal perfusion. Normal pump function. EF 50-55%  . PERCUTANEOUS CORONARY STENT INTERVENTION (PCI-S)  12/03/2013   mRCA - Xience Alpine DES 3.0 mm x 18 mm --> 3.5 mm  .  SEPTOPLASTY  1970   due to Fx Nose  . TRANSTHORACIC ECHOCARDIOGRAM  12/03/2013; 11/2014   a. Mild Conc LVH; EF 45-50%, Mild Inferior HK. Grade 1 DD;; b. EF 55-60%. No RWMA. Moderate RV dilation but normal function.    Current Meds  Medication Sig  . atorvastatin (LIPITOR) 40 MG tablet TAKE 1 TABLET BY MOUTH  DAILY  . BRILINTA 90 MG TABS tablet TAKE 1 TABLET (90 MG TOTAL) BY MOUTH 2 (TWO) TIMES DAILY.  . fenofibrate 160 MG tablet TAKE 1 TABLET BY MOUTH  DAILY  . folic acid (CVS FOLIC ACID) A999333 MCG tablet Take 1 tablet (400 mcg total) by mouth daily.  Marland Kitchen glipiZIDE (GLUCOTROL) 5 MG tablet Take 5 mg by mouth daily before breakfast.  . lisinopril (PRINIVIL,ZESTRIL) 2.5 MG tablet TAKE 1 TABLET BY MOUTH  DAILY  . metFORMIN (GLUCOPHAGE) 500 MG tablet Take 1 tablet by mouth  twice a day with meals  . metoprolol tartrate (LOPRESSOR) 25 MG tablet TAKE ONE-HALF TABLET BY  MOUTH TWO TIMES DAILY  . NITROSTAT 0.4 MG SL tablet DISSOLVE 1 TABLET UNDER THE TONGUE FOR CHEST PAIN. MAY REPEAT EVERY 5MINUTES UP TO 3 DOSES. IF NO RELIEF, CALL 911**  . OMEGA-3 KRILL OIL 300 MG CAPS Take 1 capsule by mouth daily.   . vitamin B-12 (CYANOCOBALAMIN) 1000 MCG tablet Take 1 tablet (1,000 mcg total) by mouth daily.  . vitamin B-6 (PYRIDOXINE) 25 MG tablet Take 1 tablet (25 mg total) by mouth daily.    Allergies  Allergen Reactions  . Metformin And Related     Intolerant of more than 1000mg  a day    Social History   Social History  . Marital status: Married    Spouse name: N/A  . Number of children: 1  . 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 Smoking status: Former Smoker    Packs/day: 0.50    Years: 20.00    Types: Cigarettes    Quit date: 12/03/2013  . Smokeless tobacco: Never Used  . Alcohol use Yes     Comment: occ on weekends  . Drug use: No  . Sexual activity: Not Asked   Other  Topics Concern  . None   Social History Narrative   Married 1988 and lives with wife   One son   Works at Museum/gallery exhibitions officer, Librarian, academic.   Forward 10 pack year smoker, who quit on the day of his MI.    family history includes Cancer in his father and mother; Diabetes in his father; Hearing loss in his brother; Heart disease in his paternal grandfather; Hyperlipidemia in his father; Hypertension in his father.  Wt Readings from Last 3 Encounters:  01/09/17 97.5 kg (215 lb)  12/05/16 95.3 kg (210 lb)  09/06/16 96.2 kg (212 lb)    PHYSICAL EXAM BP 137/79   Pulse 88   Ht  5' 10.5" (1.791 m)   Wt 97.5 kg (215 lb)   BMI 30.41 kg/m  General appearance: alert, cooperative, appears stated age, no distress and mildly obese; wall nourished well groomed Neck: no adenopathy, no carotid bruit and no JVD Lungs: clear to auscultation bilaterally, normal percussion bilaterally and non-labored Heart: regular rate and rhythm, S1 and S2 normal, no murmur, click, rub or gallop; nondisplaced PMI Abdomen: soft, non-tender; bowel sounds normal; no masses,  no organomegaly; no HJR Extremities: extremities normal, atraumatic, no cyanosis, or edema  Pulses: 2+ and symmetric;  Skin: mobility and turgor normal, no evidence of bleeding or bruising and no lesions noted Neurologic: Mental status: Alert, oriented, thought content appropriate   Adult ECG Report  Rate: 88 ;  Rhythm: normal sinus rhythm and Inferior MI, age undetermined. Otherwise normal axis, intervals and durations.;   Narrative Interpretation: Stable EKG   Other studies Reviewed: Additional studies/ records that were reviewed today include:  Recent Labs:   Lab Results  Component Value Date   CHOL 207 (H) 09/04/2016   HDL 36.20 (L) 09/04/2016   LDLCALC 86 05/10/2014   LDLDIRECT 100.0 09/04/2016   TRIG (H) 09/04/2016    432.0 Triglyceride is over 400; calculations on Lipids are invalid.   CHOLHDL 6 09/04/2016    ASSESSMENT  / PLAN: Problem List Items Addressed This Visit    CAD S/P percutaneous coronary angioplasty - Promus Premier DES 3.0 mm x 18 mm (3.5 mm) to mid RCA occlusion; no significant left-sided disease. (Chronic)    No active anginal symptoms. Doing well. Is active without recurrent symptoms or bleeding issues from [place]. Remains on Brilinta without aspirin as well as beta blocker, ACE inhibitor and atorvastatin.      Relevant Orders   EKG 12-Lead   HYPERLIPIDEMIA, MIXED -- significant hypertriglyceridemia (Chronic)    Overall - LDL looks OK, but TG are still elevated - on Atorvastatin. Discussed dietary adjustments - but may need to consider adding Fenofibrate. Being monitored by PCP.      Essential hypertension (Chronic)    Borderline pressure today. I would like to increase his lisinopril to 5 mg daily. The intentional be for him to stop the beta blocker (my concern is near syncope would be this is more bradycardic and tachycardic since he has not noted any palpitations)      Obesity    The patient understands the need to lose weight with diet and exercise. We have discussed specific strategies for this.      Near syncope - Primary    Interesting, again he incidentally noted to me as well. His PCP noted that, and it somewhat concerning. Does not sound like it is tachycardia mediated. If anything away that it may be having some bradycardia. Plan: We'll hold beta blocker. We talked about potentially having him wear a monitor, but based on how infrequently these occur, 30 day monitor may not catch an event. I would consider loop recorder, and even asked him about a smart phone application the kidney used to monitor heart rhythms.  I asked him that if he has more than 2 episodes in a month's I think we'll try to get him to wear a 30 day monitor. Potentially even consider ILR --> I will have him follow-up in a few months with one of our various practitioners to reevaluate his near syncope.        Relevant Orders   EKG 12-Lead   VAS US CAROTID      Current  medicines are reviewed at length with the patient today. (+/- concerns) n/a The following changes have been made: -- see below  Patient Instructions   SCHEDULE AT [facility] has requested that you have a carotid duplex. This test is an ultrasound of the carotid arteries in your neck. It looks at blood flow through these arteries that supply the brain with blood. Allow one hour for this exam. There are no restrictions or special instructions.   NO CHANGE  IN CURRENT MEDICATION .    CALL OFFICE IF YOU HAVE AN EPISODE OF PASSING OUT 1-  WILL PROBABLY HAVE YOU HOLD TAKING METOPROLOL TARTRATE AND INCREASE LISINOPRIL TO 5 MG 2- IF YOU HAVE 2 EPISODES IN ONE MONTH , WE WILL SCHEDULE YOU TO WEAR EVENT MONITOR FOR 30 DAYS.   Your physician recommends that you schedule a follow-up appointment in4-6 MONTHS WITH  EXTENDER-- BERGE, NP   Your physician wants you to follow-up in [place].You will receive a reminder letter in the mail two months in advance. If you don't receive a letter, please call our office to schedule the follow-up appointment.  If you need a refill on your cardiac medications before your next appointment, please call your pharmacy.    Studies Ordered:   Orders Placed This Encounter  Procedures  . EKG 12-Lead      Glenetta Hew, M.D., M.S. Interventional Cardiologist   Pager # [phone] Phone # [phone] [id number] [place], [place] [address]

## 2017-01-09 NOTE — Patient Instructions (Addendum)
  SCHEDULE AT [facility] Your physician has requested that you have a carotid duplex. This test is an ultrasound of the carotid arteries in your neck. It looks at blood flow through these arteries that supply the brain with blood. Allow one hour for this exam. There are no restrictions or special instructions.   NO CHANGE  IN CURRENT MEDICATION .    CALL OFFICE IF YOU HAVE AN EPISODE OF PASSING OUT 1-  WILL PROBABLY HAVE YOU HOLD TAKING METOPROLOL TARTRATE AND INCREASE LISINOPRIL TO 5 MG 2- IF YOU HAVE 2 EPISODES IN ONE MONTH , WE WILL SCHEDULE YOU TO WEAR EVENT MONITOR FOR 30 DAYS.   Your physician recommends that you schedule a follow-up appointment in4-6 MONTHS WITH  EXTENDER-- BERGE, NP   Your physician wants you to follow-up in [place].You will receive a reminder letter in the mail two months in advance. If you don't receive a letter, please call our office to schedule the follow-up appointment.  If you need a refill on your cardiac medications before your next appointment, please call your pharmacy.

## 2017-01-10 ENCOUNTER — Other Ambulatory Visit: Payer: Self-pay | Admitting: Cardiology

## 2017-01-10 ENCOUNTER — Telehealth: Payer: Self-pay

## 2017-01-10 MED ORDER — ONDANSETRON HCL 4 MG PO TABS: 4.0000 mg | ORAL_TABLET | Freq: Three times a day (TID) | ORAL | 0 refills | Status: DC | PRN

## 2017-01-10 NOTE — Telephone Encounter (Signed)
Would hold lisinopril and metformin until resolved.  If no fever, then likely gastroenteritis and prevalent in the community recently.  Rest, fluids, zofran (rx sent). If fever, then possible flu.  Would start tamiflu and okay to call/send in 75mg  BID x10 days.  I didn't send tamiflu rx yet.  Thanks.

## 2017-01-10 NOTE — Telephone Encounter (Signed)
Spoke to pt. He is going to get the Zofran. He is not running a fever. I advised him if he starts running a fever, call the office and let the dr on call know about our conversation today so Tamiflu can be called in.

## 2017-01-10 NOTE — Telephone Encounter (Signed)
Pt left v/m; pt is having vomiting and diarrhea,h/a and aches all over since 01/09/17. No fever.last vomited 6:30 AM this morning and diarrhea last 10AM this morning.Pt does not want to make appt.Pt was offered appt at [facility]. Pt request cb after Dr Damita Dunnings reviews. [place].

## 2017-01-11 ENCOUNTER — Encounter: Payer: Self-pay | Admitting: Cardiology

## 2017-01-11 NOTE — Assessment & Plan Note (Signed)
No active anginal symptoms. Doing well. Is active without recurrent symptoms or bleeding issues from [place]. Remains on Brilinta without aspirin as well as beta blocker, ACE inhibitor and atorvastatin.

## 2017-01-11 NOTE — Assessment & Plan Note (Signed)
Interesting, again he incidentally noted to me as well. His PCP noted that, and it somewhat concerning. Does not sound like it is tachycardia mediated. If anything away that it may be having some bradycardia. Plan: We'll hold beta blocker. We talked about potentially having him wear a monitor, but based on how infrequently these occur, 30 day monitor may not catch an event. I would consider loop recorder, and even asked him about a smart phone application the kidney used to monitor heart rhythms.  I asked him that if he has more than 2 episodes in a month's I think we'll try to get him to wear a 30 day monitor. Potentially even consider ILR --> I will have him follow-up in a few months with one of our various practitioners to reevaluate his near syncope.

## 2017-01-11 NOTE — Assessment & Plan Note (Signed)
Overall - LDL looks OK, but TG are still elevated - on Atorvastatin. Discussed dietary adjustments - but may need to consider adding Fenofibrate. Being monitored by PCP.

## 2017-01-11 NOTE — Assessment & Plan Note (Signed)
Borderline pressure today. I would like to increase his lisinopril to 5 mg daily. The intentional be for him to stop the beta blocker (my concern is near syncope would be this is more bradycardic and tachycardic since he has not noted any palpitations)

## 2017-01-11 NOTE — Assessment & Plan Note (Signed)
The patient understands the need to lose weight with diet and exercise. We have discussed specific strategies for this.  

## 2017-01-11 NOTE — Assessment & Plan Note (Signed)
Preserved EF on repeat Echo post MI.  No wall motion abnormalities.  NO recurrent angina or CHF.

## 2017-01-15 ENCOUNTER — Other Ambulatory Visit (INDEPENDENT_AMBULATORY_CARE_PROVIDER_SITE_OTHER): Payer: 59

## 2017-01-15 DIAGNOSIS — E1140 Type 2 diabetes mellitus with diabetic neuropathy, unspecified: Secondary | ICD-10-CM

## 2017-01-15 DIAGNOSIS — E1149 Type 2 diabetes mellitus with other diabetic neurological complication: Secondary | ICD-10-CM

## 2017-01-15 LAB — HEMOGLOBIN A1C: Hgb A1c MFr Bld: 8.8 % — ABNORMAL HIGH (ref 4.6–6.5)

## 2017-01-16 ENCOUNTER — Ambulatory Visit (HOSPITAL_COMMUNITY)
Admission: RE | Admit: 2017-01-16 | Discharge: 2017-01-16 | Disposition: A | Discharge status: Home / Self Care | Payer: 59 | Source: Ambulatory Visit | Attending: Cardiovascular Disease | Admitting: Cardiovascular Disease

## 2017-01-16 DIAGNOSIS — I6523 Occlusion and stenosis of bilateral carotid arteries: Secondary | ICD-10-CM | POA: Diagnosis not present

## 2017-01-16 DIAGNOSIS — I10 Essential (primary) hypertension: Secondary | ICD-10-CM | POA: Diagnosis not present

## 2017-01-16 DIAGNOSIS — I2510 Atherosclerotic heart disease of native coronary artery without angina pectoris: Secondary | ICD-10-CM | POA: Insufficient documentation

## 2017-01-16 DIAGNOSIS — R55 Syncope and collapse: Secondary | ICD-10-CM | POA: Diagnosis not present

## 2017-01-16 DIAGNOSIS — Z87891 Personal history of nicotine dependence: Secondary | ICD-10-CM | POA: Insufficient documentation

## 2017-01-16 DIAGNOSIS — E785 Hyperlipidemia, unspecified: Secondary | ICD-10-CM | POA: Diagnosis not present

## 2017-01-16 DIAGNOSIS — E119 Type 2 diabetes mellitus without complications: Secondary | ICD-10-CM | POA: Insufficient documentation

## 2017-01-21 ENCOUNTER — Ambulatory Visit (INDEPENDENT_AMBULATORY_CARE_PROVIDER_SITE_OTHER): Payer: 59 | Admitting: Family Medicine

## 2017-01-21 ENCOUNTER — Encounter: Payer: Self-pay | Admitting: Family Medicine

## 2017-01-21 DIAGNOSIS — E1149 Type 2 diabetes mellitus with other diabetic neurological complication: Secondary | ICD-10-CM | POA: Diagnosis not present

## 2017-01-21 NOTE — Progress Notes (Signed)
Pre visit review using our clinic review tool, if applicable. No additional management support is needed unless otherwise documented below in the visit note. 

## 2017-01-21 NOTE — Progress Notes (Signed)
Diabetes:  Using medications without difficulties:yes Hypoglycemic episodes:no sx  Hyperglycemic episodes:no sx Feet problems: at baseline.  Still on B12.  Still with some stinging.   Blood Sugars averaging: not checked often eye exam within last year: done 01/2017, no retinopathy per patient report.   December and January were awful/stressful for patient with a buyout at his work.  This clearly changed his work situation.  His situation is some better.  Had been eating more sweets.   A1c up, d/w pt.   He has plans to work on diet and exercise.  He thinks he can make changes.    No more presyncope in the meantime.  Saw cardiology in the meantime.  Had carotids eval in the meantime that was unremarkable.    PMH and SH reviewed  Meds, vitals, and allergies reviewed.   ROS: Per HPI unless specifically indicated in ROS section   GEN: nad, alert and oriented HEENT: mucous membranes moist NECK: supple w/o LA CV: rrr. PULM: ctab, no inc wob ABD: soft, +bs EXT: no edema

## 2017-01-21 NOTE — Patient Instructions (Signed)
Recheck in 3 months.  Labs ahead of time.   If not better in the meantime, then let me know.  Take care.  Glad to see you.

## 2017-01-22 ENCOUNTER — Other Ambulatory Visit: Payer: Self-pay | Admitting: Family Medicine

## 2017-01-22 NOTE — Assessment & Plan Note (Signed)
A1c is up. Patient is not surprised, diet and exercise and greatly affected with workup able. Work is settled down some. He is going to work on diet and exercise. He will update me in the meantime if his sugar is not getting better. Otherwise recheck in about 3 months. He agrees.

## 2017-02-16 ENCOUNTER — Other Ambulatory Visit: Payer: Self-pay | Admitting: Cardiology

## 2017-02-21 ENCOUNTER — Other Ambulatory Visit: Payer: Self-pay | Admitting: Cardiology

## 2017-02-24 NOTE — Telephone Encounter (Signed)
Rx(s) sent to pharmacy electronically.  

## 2017-02-25 ENCOUNTER — Encounter: Payer: Self-pay | Admitting: Family Medicine

## 2017-03-18 ENCOUNTER — Other Ambulatory Visit: Payer: Self-pay | Admitting: Cardiology

## 2017-03-18 NOTE — Telephone Encounter (Signed)
Rx(s) sent to pharmacy electronically.  

## 2017-04-13 ENCOUNTER — Other Ambulatory Visit: Payer: Self-pay | Admitting: Family Medicine

## 2017-04-13 DIAGNOSIS — E1149 Type 2 diabetes mellitus with other diabetic neurological complication: Secondary | ICD-10-CM

## 2017-04-17 ENCOUNTER — Other Ambulatory Visit (INDEPENDENT_AMBULATORY_CARE_PROVIDER_SITE_OTHER): Payer: 59

## 2017-04-17 DIAGNOSIS — E1149 Type 2 diabetes mellitus with other diabetic neurological complication: Secondary | ICD-10-CM | POA: Diagnosis not present

## 2017-04-17 LAB — BASIC METABOLIC PANEL
BUN: 15 mg/dL (ref 6–23)
CHLORIDE: 102 meq/L (ref 96–112)
CO2: 25 mEq/L (ref 19–32)
CREATININE: 0.85 mg/dL (ref 0.40–1.50)
Calcium: 9.7 mg/dL (ref 8.4–10.5)
GFR: 98.59 mL/min (ref >60.00)
Glucose, Bld: 166 mg/dL — ABNORMAL HIGH (ref 70–99)
Potassium: 4.5 mEq/L (ref 3.5–5.1)
Sodium: 137 mEq/L (ref 135–145)

## 2017-04-17 LAB — HEMOGLOBIN A1C: HEMOGLOBIN A1C: 9.1 % — AB (ref 4.6–6.5)

## 2017-04-21 ENCOUNTER — Ambulatory Visit: Payer: 59 | Admitting: Family Medicine

## 2017-04-25 ENCOUNTER — Ambulatory Visit (INDEPENDENT_AMBULATORY_CARE_PROVIDER_SITE_OTHER): Payer: 59 | Admitting: Family Medicine

## 2017-04-25 ENCOUNTER — Encounter: Payer: Self-pay | Admitting: Family Medicine

## 2017-04-25 DIAGNOSIS — E1149 Type 2 diabetes mellitus with other diabetic neurological complication: Secondary | ICD-10-CM | POA: Diagnosis not present

## 2017-04-25 NOTE — Progress Notes (Signed)
Diabetes:  Using medications without difficulties:yes Hypoglycemic episodes:no sx Hyperglycemic episodes: no sx of hyperglycemia, not checking sugar often Feet problems: at baseline.  Blood Sugars averaging: limited checks at home.  A1c d/w pt.  Up from prev.  D/w pt about options.   He was eating ice cream at night.  D/w pt.  He didn't give up chips and french fries yet.    He has ongoing stressors that he is trying to work through.    PMH and SH reviewed  Meds, vitals, and allergies reviewed.   ROS: Per HPI unless specifically indicated in ROS section   GEN: nad, alert and oriented HEENT: mucous membranes moist NECK: supple w/o LA CV: rrr. ABD: soft, +bs EXT: no edema

## 2017-04-25 NOTE — Patient Instructions (Signed)
Check your sugar in pairs- before and then 2 hours later.   Update me in a few weeks.  Plan on recheck in about 3 months, labs ahead of time.  Take care.  Glad to see you.

## 2017-04-26 NOTE — Assessment & Plan Note (Signed)
This isn't going to get better w/o diet work from patient, d/w pt.  He understands this.  Needs to cut carbs, exercise and update me about his sugars.  Long discussion re: diet and exercise. D/w pt about other options, ex: long acting insulin via pen.  Demonstrated for patient on generic display kit.  He doesn't want to start insulin yet.  If sugars not improved in the next few weeks, then he'll update me and likely rec insulin at that point.  He agrees.  >25 minutes spent in face to face time with patient, >50% spent in counselling or coordination of care.

## 2017-04-30 ENCOUNTER — Encounter: Payer: Self-pay | Admitting: Family Medicine

## 2017-05-14 ENCOUNTER — Ambulatory Visit (INDEPENDENT_AMBULATORY_CARE_PROVIDER_SITE_OTHER): Payer: 59 | Admitting: Family Medicine

## 2017-05-14 ENCOUNTER — Encounter: Payer: Self-pay | Admitting: Family Medicine

## 2017-05-14 VITALS — BP 102/80 | HR 72 | Temp 98.5°F | Wt 210.0 lb

## 2017-05-14 DIAGNOSIS — L03012 Cellulitis of left finger: Secondary | ICD-10-CM

## 2017-05-14 DIAGNOSIS — E1149 Type 2 diabetes mellitus with other diabetic neurological complication: Secondary | ICD-10-CM

## 2017-05-14 MED ORDER — DOXYCYCLINE HYCLATE 100 MG PO TABS: 100.0000 mg | ORAL_TABLET | Freq: Two times a day (BID) | ORAL | 0 refills | Status: DC

## 2017-05-14 NOTE — Patient Instructions (Signed)
Paronychia Paronychia is an infection of the skin that surrounds a nail. It usually affects the skin around a fingernail, but it may also occur near a toenail. It often causes pain and swelling around the nail. This condition may come on suddenly or develop over a longer period. In some cases, a collection of pus (abscess) can form near or under the nail. Usually, paronychia is not serious and it clears up with treatment. What are the causes? This condition may be caused by bacteria or fungi. It is commonly caused by either Streptococcus or Staphylococcus bacteria. The bacteria or fungi often cause the infection by getting into the affected area through an opening in the skin, such as a cut or a hangnail. What increases the risk? This condition is more likely to develop in:  People who get their hands wet often, such as those who work as Designer, industrial/product, bartenders, or nurses.  People who bite their fingernails or suck their thumbs.  People who trim their nails too short.  People who have hangnails or injured fingertips.  People who get manicures.  People who have diabetes.  What are the signs or symptoms? Symptoms of this condition include:  Redness and swelling of the skin near the nail.  Tenderness around the nail when you touch the area.  Pus-filled bumps under the cuticle. The cuticle is the skin at the base or sides of the nail.  Fluid or pus under the nail.  Throbbing pain in the area.  How is this diagnosed? This condition is usually diagnosed with a physical exam. In some cases, a sample of pus may be taken from an abscess to be tested in a lab. This can help to determine what type of bacteria or fungi is causing the condition. How is this treated? Treatment for this condition depends on the cause and severity of the condition. If the condition is mild, it may clear up on its own in a few days. Your health care provider may recommend soaking the affected area in warm water a  few times a day. When treatment is needed, the options may include:  Antibiotic medicine, if the condition is caused by a bacterial infection.  Antifungal medicine, if the condition is caused by a fungal infection.  Incision and drainage, if an abscess is present. In this procedure, the health care provider will cut open the abscess so the pus can drain out.  Follow these instructions at home:  Soak the affected area in warm water if directed to do so by your health care provider. You may be told to do this for 20 minutes, 2-3 times a day. Keep the area dry in between soakings.  Take medicines only as directed by your health care provider.  If you were prescribed an antibiotic medicine, finish all of it even if you start to feel better.  Keep the affected area clean.  Do not try to drain a fluid-filled bump yourself.  If you will be washing dishes or performing other tasks that require your hands to get wet, wear rubber gloves. You should also wear gloves if your hands might come in contact with irritating substances, such as cleaners or chemicals.  Follow your health care provider's instructions about: ? Wound care. ? Bandage (dressing) changes and removal. Contact a health care provider if:  Your symptoms get worse or do not improve with treatment.  You have a fever or chills.  You have redness spreading from the affected area.  You have continued  or increased fluid, blood, or pus coming from the affected area.  Your finger or knuckle becomes swollen or is difficult to move. This information is not intended to replace advice given to you by your health care provider. Make sure you discuss any questions you have with your health care provider. Document Released: 05/14/2001 Document Revised: 04/25/2016 Document Reviewed: 10/26/2014 Elsevier Interactive Patient Education  2018 Elsevier Inc.  

## 2017-05-14 NOTE — Assessment & Plan Note (Signed)
New in a diabetic. Pt has already partially drained this area. Will advised warm soaks- see AVS. Placed on 10 day course of doxycyline 100 mg twice daily. Call or return to clinic prn if these symptoms worsen or fail to improve as anticipated. The patient indicates understanding of these issues and agrees with the plan.

## 2017-05-14 NOTE — Progress Notes (Signed)
Pre visit review using our clinic review tool, if applicable. No additional management support is needed unless otherwise documented below in the visit note. 

## 2017-05-14 NOTE — Progress Notes (Signed)
Subjective:   Patient ID: [name] [name], male    DOB: 01-12-1961, 56 y.o.   MRN: [id number]  [name] [name] is a pleasant 57 y.o. year old male pt of Dr. Damita Dunnings, new to me, with h/o diabetes, who presents to clinic today with Hand Pain (left thumb )  on 05/14/2017  HPI:  Left thumb infection- was biting on the skin around his finger nail a few days ago.  Past couple of days, noticed area around the nail is swollen, tender, warm and red.  He tried to stick a needle in it last night- did get some thick drainage from it. Still "pulsating." No fevers, chills, nausea or vomiting.  Diabetes has not been under great control- a1c was high last month.  Lab Results  Component Value Date   HGBA1C 9.1 (H) 04/17/2017     Current Outpatient Prescriptions on File Prior to Visit  Medication Sig Dispense Refill  . atorvastatin (LIPITOR) 40 MG tablet TAKE 1 TABLET BY MOUTH  DAILY 90 tablet 3  . BRILINTA 90 MG TABS tablet TAKE 1 TABLET (90 MG TOTAL) BY MOUTH 2 (TWO) TIMES DAILY. 60 tablet 6  . fenofibrate 160 MG tablet TAKE 1 TABLET BY MOUTH  DAILY 90 tablet 3  . folic acid ([facility] FOLIC ACID) 086 MCG tablet Take 1 tablet (400 mcg total) by mouth daily.    Marland Kitchen glipiZIDE (GLUCOTROL) 5 MG tablet TAKE 1 TABLET BY MOUTH  DAILY BEFORE BREAKFAST 90 tablet 1  . lisinopril (PRINIVIL,ZESTRIL) 2.5 MG tablet TAKE 1 TABLET BY MOUTH  DAILY 90 tablet 3  . metFORMIN (GLUCOPHAGE) 500 MG tablet Take 1 tablet by mouth  twice a day with meals 180 tablet 3  . metoprolol tartrate (LOPRESSOR) 25 MG tablet TAKE ONE-HALF TABLET BY  MOUTH TWICE A DAY 90 tablet 3  . NITROSTAT 0.4 MG SL tablet DISSOLVE 1 TABLET UNDER THE TONGUE FOR CHEST PAIN. MAY REPEAT EVERY 5MINUTES UP TO 3 DOSES. IF NO RELIEF, CALL 911** 25 tablet 1  . OMEGA-3 KRILL OIL 300 MG CAPS Take 1 capsule by mouth daily.     . vitamin B-12 (CYANOCOBALAMIN) 1000 MCG tablet Take 1 tablet (1,000 mcg total) by mouth daily.    . vitamin B-6 (PYRIDOXINE) 25 MG  tablet Take 1 tablet (25 mg total) by mouth daily.     No current facility-administered medications on file prior to visit.     Allergies  Allergen Reactions  . Metformin And Related     Intolerant of more than 1000mg  a day    Past Medical History:  Diagnosis Date  . Allergic rhinitis   . CAD S/P percutaneous coronary angioplasty 12/03/2013   PCI-RCA - Xience Alpine DES 3.0 mm x 18 mm --> 3.5 mm; Echo 1/'15" EF 45-50%, mild C LVH, mild Inf HK, Gr 1 DD  . Diabetes mellitus    type2  . Fracture of right hand 1983  . Hyperlipidemia   . Hypertension   . Nocturia   . Paresthesia    tingling in feet  . STEMI (ST elevation myocardial infarction) of inf. wall 12/03/2013    Past Surgical History:  Procedure Laterality Date  . KNEE SURGERY     B arthroscopic  . LEFT HEART CATHETERIZATION WITH CORONARY ANGIOGRAM N/A 12/03/2013   Procedure: LEFT HEART CATHETERIZATION WITH CORONARY ANGIOGRAM;  Surgeon: Leonie Man, MD;  Location: [facility] CATH LAB;  Service: Cardiovascular;  Laterality: N/A;  . NM MYOVIEW LTD  11/2015  Low risk stress test. Normal perfusion. Normal pump function. EF 50-55%  . PERCUTANEOUS CORONARY STENT INTERVENTION (PCI-S)  12/03/2013   mRCA - Xience Alpine DES 3.0 mm x 18 mm --> 3.5 mm  . SEPTOPLASTY  1970   due to Fx Nose  . TRANSTHORACIC ECHOCARDIOGRAM  12/03/2013; 11/2014   a. Mild Conc LVH; EF 45-50%, Mild Inferior HK. Grade 1 DD;; b. EF 55-60%. No RWMA. Moderate RV dilation but normal function.    Family History  Problem Relation Age of Onset  . Cancer Mother        ovarian  . Hypertension Father   . Hyperlipidemia Father   . Diabetes Father        lost 60 pounds and off all meds  . Cancer Father        bladder- Kidney cancer  . Hearing loss Brother   . Heart disease Paternal Grandfather        MI  . Colon cancer Neg Hx   . Prostate cancer Neg Hx     Social History   Social History  . Marital status: Married    Spouse name: N/A  . Number of children: 1   . 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 Smoking status: Former Smoker    Packs/day: 0.50    Years: 20.00    Types: Cigarettes    Quit date: 12/03/2013  . Smokeless tobacco: Never Used  . Alcohol use Yes     Comment: occ on weekends  . Drug use: No  . Sexual activity: Not on file   Other Topics Concern  . Not on file   Social History Narrative   Married 1988 and lives with wife   One son   Works at Museum/gallery exhibitions officer, Librarian, academic.   Forward 10 pack year smoker, who quit on the day of his MI.   The PMH, PSH, Social History, Family History, Medications, and allergies have been reviewed in [facility], and have been updated if relevant.   Review of Systems  Constitutional: Negative.   Gastrointestinal: Negative.   Skin: Positive for wound.  Neurological: Negative.   All other systems reviewed and are negative.      Objective:    BP 102/80   Pulse 72   Temp 98.5 F (36.9 C)   Wt 210 lb (95.3 kg)   SpO2 95%   BMI 29.71 kg/m    Physical Exam  Constitutional: He is oriented to person, place, and time. He appears well-developed and well-nourished. No distress.  HENT:  Head: Normocephalic and atraumatic.  Eyes: Conjunctivae are normal.  Cardiovascular: Normal rate.   Pulmonary/Chest: Effort normal.  Musculoskeletal:  Left thumb- swelling along lateral nail bed, with erythema and warmth, open area where drainage visible  Neurological: He is alert and oriented to person, place, and time. No cranial nerve deficit.  Skin: He is not diaphoretic.  See MSK exam  Psychiatric: He has a normal mood and affect. His behavior is normal. Judgment and thought content normal.  Nursing note and vitals reviewed.         Assessment & Plan:   Diabetes mellitus type 2 with neurological manifestations ([place])  Paronychia of left thumb No Follow-up on file.

## 2017-06-19 ENCOUNTER — Other Ambulatory Visit: Payer: Self-pay | Admitting: *Deleted

## 2017-06-19 ENCOUNTER — Encounter: Payer: Self-pay | Admitting: Family Medicine

## 2017-06-19 MED ORDER — ONETOUCH DELICA LANCETS 33G MISC: 3 refills | Status: AC

## 2017-06-19 MED ORDER — GLUCOSE BLOOD VI STRP: ORAL_STRIP | 3 refills | Status: DC

## 2017-07-13 ENCOUNTER — Other Ambulatory Visit: Payer: Self-pay | Admitting: Family Medicine

## 2017-07-13 DIAGNOSIS — E1149 Type 2 diabetes mellitus with other diabetic neurological complication: Secondary | ICD-10-CM

## 2017-07-24 ENCOUNTER — Other Ambulatory Visit (INDEPENDENT_AMBULATORY_CARE_PROVIDER_SITE_OTHER): Payer: 59

## 2017-07-24 DIAGNOSIS — E1149 Type 2 diabetes mellitus with other diabetic neurological complication: Secondary | ICD-10-CM

## 2017-07-24 LAB — COMPREHENSIVE METABOLIC PANEL
ALT: 35 U/L (ref 0–53)
AST: 23 U/L (ref 0–37)
Albumin: 4.5 g/dL (ref 3.5–5.2)
Alkaline Phosphatase: 43 U/L (ref 39–117)
BILIRUBIN TOTAL: 0.5 mg/dL (ref 0.2–1.2)
BUN: 17 mg/dL (ref 6–23)
CALCIUM: 9.7 mg/dL (ref 8.4–10.5)
CHLORIDE: 100 meq/L (ref 96–112)
CO2: 26 meq/L (ref 19–32)
Creatinine, Ser: 0.84 mg/dL (ref 0.40–1.50)
GFR: 99.85 mL/min (ref >60.00)
GLUCOSE: 193 mg/dL — AB (ref 70–99)
Potassium: 4.3 mEq/L (ref 3.5–5.1)
Sodium: 134 mEq/L — ABNORMAL LOW (ref 135–145)
Total Protein: 7.4 g/dL (ref 6.0–8.3)

## 2017-07-24 LAB — LDL CHOLESTEROL, DIRECT: LDL DIRECT: 91 mg/dL

## 2017-07-24 LAB — LIPID PANEL
Cholesterol: 236 mg/dL — ABNORMAL HIGH (ref 0–200)
HDL: 31.4 mg/dL — AB (ref >39.00)
Total CHOL/HDL Ratio: 8

## 2017-07-24 LAB — HEMOGLOBIN A1C: HEMOGLOBIN A1C: 7.8 % — AB (ref 4.6–6.5)

## 2017-07-28 ENCOUNTER — Encounter: Payer: Self-pay | Admitting: Family Medicine

## 2017-07-28 ENCOUNTER — Ambulatory Visit (INDEPENDENT_AMBULATORY_CARE_PROVIDER_SITE_OTHER): Payer: 59 | Admitting: Family Medicine

## 2017-07-28 DIAGNOSIS — E1149 Type 2 diabetes mellitus with other diabetic neurological complication: Secondary | ICD-10-CM | POA: Diagnosis not present

## 2017-07-28 DIAGNOSIS — E782 Mixed hyperlipidemia: Secondary | ICD-10-CM

## 2017-07-28 DIAGNOSIS — L723 Sebaceous cyst: Secondary | ICD-10-CM

## 2017-07-28 MED ORDER — DOXYCYCLINE HYCLATE 100 MG PO TABS: 100.0000 mg | ORAL_TABLET | Freq: Two times a day (BID) | ORAL | 0 refills | Status: DC

## 2017-07-28 NOTE — Patient Instructions (Addendum)
Start doxycycline today.  Warm compresses on the cyst in the meantime.   Recheck tomorrow- get the last appointment on the schedule.  Plan on recheck in about 3 months anyway, with labs ahead of time.  Take care.  Glad to see you.

## 2017-07-28 NOTE — Assessment & Plan Note (Signed)
Still with triglyceride elevation. Continue work on diet and exercise. No change in meds.

## 2017-07-28 NOTE — Progress Notes (Signed)
Diabetes:  Using medications without difficulties:yes Hypoglycemic episodes:no Hyperglycemic episodes:no Feet problems: at baseline with tingling, some dec in pain now Blood Sugars averaging: 100-200, better with exercise.   dw pt.   eye exam within last year:yes Labs d/w pt.  A1c improved.   Elevated Cholesterol: Using medications without problems:yes Muscle aches: no Diet compliance:yes Exercise:yes TG and TC still elevated, d/w pt.    Knot on his back.  H/o likely seb cyst.  Noted more in the last week.  Mildly tender with palpation, not o/w.  Not draining.  Reddish in the last week.    Meds, vitals, and allergies reviewed.   ROS: Per HPI unless specifically indicated in ROS section   GEN: nad, alert and oriented HEENT: mucous membranes moist NECK: supple w/o LA CV: rrr. PULM: ctab, no inc wob ABD: soft, +bs EXT: no edema SKIN: no acute rash but minimally irritated seb cyst on the L upper back  Diabetic foot exam: Normal inspection No skin breakdown No calluses  Normal DP pulses Dec sensation to light touch and monofilament on the BLE Nails normal

## 2017-07-28 NOTE — Assessment & Plan Note (Signed)
A1c improved, not at goal. Continue work on diet and exercise. Recheck in about 3 months. He agrees. No change in meds at this point.

## 2017-07-28 NOTE — Assessment & Plan Note (Signed)
Minimal irritation. Start doxycycline. Return for recheck tomorrow. He may end up needing incision and drainage. Okay for outpatient follow-up.

## 2017-07-29 ENCOUNTER — Ambulatory Visit (INDEPENDENT_AMBULATORY_CARE_PROVIDER_SITE_OTHER): Payer: 59 | Admitting: Family Medicine

## 2017-07-29 ENCOUNTER — Encounter: Payer: Self-pay | Admitting: Family Medicine

## 2017-07-29 DIAGNOSIS — L723 Sebaceous cyst: Secondary | ICD-10-CM | POA: Diagnosis not present

## 2017-07-29 DIAGNOSIS — L089 Local infection of the skin and subcutaneous tissue, unspecified: Secondary | ICD-10-CM

## 2017-07-29 NOTE — Patient Instructions (Signed)
Finish the antibiotics.  Keep it covered for now.  Pull the packing Thursday AM.  Wash with soapy water.  Keep it covered thereafter.  It may continue to drain.  Update [place] as needed.  Take care.  Glad to see you.

## 2017-07-29 NOTE — Progress Notes (Signed)
I&D  Meds, vitals, and allergies reviewed.   Indication: inf seb cyst  Pt complaints of: erythema, pain, swelling  Location: L upper back  Size: 5cm  Informed consent obtained.  Pt aware of risks not limited to but including infection, bleeding, damage to near by organs.  Prep: etoh/betadine  Anesthesia: 1%lidocaine with epi, good effect  Incision made with #11 blade  Would explored and loculations removed  Wound packed with iodoform gauze

## 2017-07-30 ENCOUNTER — Encounter: Payer: Self-pay | Admitting: Family Medicine

## 2017-07-30 NOTE — Assessment & Plan Note (Signed)
Tolerated well, no complications.  Routine postprocedure instructions d/w pt- remove packing in ~36h, keep area clean and bandaged, follow up if concerns/spreading erythema/pain. He agrees.  Update me as needed.

## 2017-07-31 ENCOUNTER — Ambulatory Visit (INDEPENDENT_AMBULATORY_CARE_PROVIDER_SITE_OTHER): Payer: 59 | Admitting: Podiatry

## 2017-07-31 ENCOUNTER — Encounter: Payer: Self-pay | Admitting: Podiatry

## 2017-07-31 DIAGNOSIS — M722 Plantar fascial fibromatosis: Secondary | ICD-10-CM | POA: Diagnosis not present

## 2017-07-31 DIAGNOSIS — E119 Type 2 diabetes mellitus without complications: Secondary | ICD-10-CM

## 2017-07-31 NOTE — Progress Notes (Signed)
He presents today requesting new orthotics states that her feet his feet are doing just great orthotics are starting to wear out some elect to have new ones.  Rick molded him for new orthotics today.

## 2017-08-25 ENCOUNTER — Other Ambulatory Visit: Payer: 59 | Admitting: Orthotics

## 2017-08-28 ENCOUNTER — Ambulatory Visit: Payer: 59 | Admitting: Orthotics

## 2017-08-28 DIAGNOSIS — M722 Plantar fascial fibromatosis: Secondary | ICD-10-CM

## 2017-08-28 NOTE — Progress Notes (Signed)
Patient came in today to p/up functional foot orthotics.   The orthotics were assessed to both fit and function.  The F/O addressed the biomechanical issues/pathologies as intended, offering good longitudinal arch support, proper offloading, and foot support. There weren't any signs of discomfort or irritation.  The F/O fit properly in footwear with minimal trimming/adjustments. 

## 2017-09-16 ENCOUNTER — Other Ambulatory Visit: Payer: Self-pay | Admitting: Family Medicine

## 2017-10-15 ENCOUNTER — Other Ambulatory Visit: Payer: Self-pay | Admitting: Family Medicine

## 2017-10-15 DIAGNOSIS — E1149 Type 2 diabetes mellitus with other diabetic neurological complication: Secondary | ICD-10-CM

## 2017-10-16 ENCOUNTER — Other Ambulatory Visit (INDEPENDENT_AMBULATORY_CARE_PROVIDER_SITE_OTHER): Payer: 59

## 2017-10-16 DIAGNOSIS — E1149 Type 2 diabetes mellitus with other diabetic neurological complication: Secondary | ICD-10-CM

## 2017-10-16 LAB — HEMOGLOBIN A1C: Hgb A1c MFr Bld: 8.6 % — ABNORMAL HIGH (ref 4.6–6.5)

## 2017-10-21 ENCOUNTER — Ambulatory Visit: Payer: 59 | Admitting: Family Medicine

## 2017-10-21 ENCOUNTER — Telehealth: Payer: Self-pay | Admitting: Family Medicine

## 2017-10-21 NOTE — Telephone Encounter (Signed)
I spoke with pt to reschedule his follow up 11/20 and he is requesting to view his 11/15 labs on MyChart. He is aware they have to be released first.

## 2017-10-22 NOTE — Telephone Encounter (Signed)
Should be released now.  Thanks.

## 2017-10-22 NOTE — Telephone Encounter (Signed)
Patient notified by telephone that labs have been released. Patient stated that he has already seen them.

## 2017-10-29 ENCOUNTER — Ambulatory Visit (INDEPENDENT_AMBULATORY_CARE_PROVIDER_SITE_OTHER): Payer: 59 | Admitting: Family Medicine

## 2017-10-29 ENCOUNTER — Encounter: Payer: Self-pay | Admitting: Family Medicine

## 2017-10-29 VITALS — BP 116/70 | HR 80 | Temp 98.6°F | Wt 212.0 lb

## 2017-10-29 DIAGNOSIS — Z23 Encounter for immunization: Secondary | ICD-10-CM | POA: Diagnosis not present

## 2017-10-29 DIAGNOSIS — E1149 Type 2 diabetes mellitus with other diabetic neurological complication: Secondary | ICD-10-CM

## 2017-10-29 MED ORDER — SITAGLIPTIN PHOSPHATE 100 MG PO TABS: 50.0000 mg | ORAL_TABLET | Freq: Every day | ORAL | 3 refills | Status: DC

## 2017-10-29 NOTE — Patient Instructions (Signed)
Price check [place] and update me if prohibitively expensive.  Start with 50mg  a day.  Recheck in about 3 months.  Do the best you can with diet and exercise.  Take care.  Glad to see you.

## 2017-10-29 NOTE — Progress Notes (Signed)
Diabetes:  Using medications without difficulties: yes Hypoglycemic episodes: no sx Hyperglycemic episodes: no sx Feet problems: tingling, variable.  He has new inserts.   Blood Sugars averaging: not checked  eye exam within last year: yes A1c up, d/w pt.  He expected it to be up since he wasn't exercising and diet was off for the last 3 months.  He pulled a muscle in his back and it took a while to get better.    Meds, vitals, and allergies reviewed.   ROS: Per HPI unless specifically indicated in ROS section   GEN: nad, alert and oriented HEENT: mucous membranes moist NECK: supple w/o LA CV: rrr. PULM: ctab, no inc wob ABD: soft, +bs EXT: no edema

## 2017-10-30 NOTE — Assessment & Plan Note (Signed)
A1c up, d/w pt.  He expected it to be up since he wasn't exercising and diet was off for the last 3 months.  D/w pt to price check januvia and update me if prohibitively expensive.  Start with 50mg  a day.  Recheck in about 3 months.  Continue work on diet and exercise.  He agrees.

## 2017-11-04 ENCOUNTER — Telehealth: Payer: Self-pay | Admitting: *Deleted

## 2017-11-04 NOTE — Telephone Encounter (Signed)
PA for Diclofenac Gel submitted thru CMM, awaiting response.

## 2017-11-11 ENCOUNTER — Other Ambulatory Visit: Payer: Self-pay | Admitting: Cardiology

## 2017-11-12 NOTE — Telephone Encounter (Signed)
Rx has been sent to the pharmacy electronically. ° °

## 2017-11-13 ENCOUNTER — Telehealth: Payer: Self-pay | Admitting: *Deleted

## 2017-11-13 NOTE — Telephone Encounter (Signed)
PA for Januvia was previously submitted thru CMM but denied.  Dr. Damita Dunnings filled out a hard copy with more info and it was faxed ib [phone], awaiting response.

## 2017-11-14 NOTE — Telephone Encounter (Signed)
PA dened by [facility].  Scanned.

## 2017-12-28 ENCOUNTER — Other Ambulatory Visit: Payer: Self-pay | Admitting: Family Medicine

## 2017-12-29 NOTE — Telephone Encounter (Signed)
Electronic refill request Last refill 7/19/7 #180/3 Last office visit 10/29/17 See allergy/contraindiction, okay to refill?

## 2017-12-30 NOTE — Telephone Encounter (Signed)
Sent. Thanks.   

## 2018-01-09 ENCOUNTER — Encounter: Payer: Self-pay | Admitting: Cardiology

## 2018-01-09 ENCOUNTER — Ambulatory Visit (INDEPENDENT_AMBULATORY_CARE_PROVIDER_SITE_OTHER): Payer: 59 | Admitting: Cardiology

## 2018-01-09 VITALS — BP 116/74 | HR 86 | Ht 70.5 in | Wt 212.0 lb

## 2018-01-09 DIAGNOSIS — Z955 Presence of coronary angioplasty implant and graft: Secondary | ICD-10-CM | POA: Diagnosis not present

## 2018-01-09 DIAGNOSIS — R55 Syncope and collapse: Secondary | ICD-10-CM | POA: Diagnosis not present

## 2018-01-09 DIAGNOSIS — E1149 Type 2 diabetes mellitus with other diabetic neurological complication: Secondary | ICD-10-CM | POA: Diagnosis not present

## 2018-01-09 DIAGNOSIS — I10 Essential (primary) hypertension: Secondary | ICD-10-CM

## 2018-01-09 DIAGNOSIS — I2510 Atherosclerotic heart disease of native coronary artery without angina pectoris: Secondary | ICD-10-CM | POA: Diagnosis not present

## 2018-01-09 DIAGNOSIS — I2119 ST elevation (STEMI) myocardial infarction involving other coronary artery of inferior wall: Secondary | ICD-10-CM | POA: Diagnosis not present

## 2018-01-09 DIAGNOSIS — E782 Mixed hyperlipidemia: Secondary | ICD-10-CM | POA: Diagnosis not present

## 2018-01-09 DIAGNOSIS — Z9861 Coronary angioplasty status: Secondary | ICD-10-CM | POA: Diagnosis not present

## 2018-01-09 MED ORDER — FENOFIBRATE 160 MG PO TABS: 160.0000 mg | ORAL_TABLET | Freq: Every day | ORAL | 3 refills | Status: DC

## 2018-01-09 MED ORDER — TICAGRELOR 90 MG PO TABS: 90.0000 mg | ORAL_TABLET | Freq: Two times a day (BID) | ORAL | 3 refills | Status: DC

## 2018-01-09 MED ORDER — ATORVASTATIN CALCIUM 40 MG PO TABS: 40.0000 mg | ORAL_TABLET | Freq: Every day | ORAL | 3 refills | Status: DC

## 2018-01-09 MED ORDER — METOPROLOL TARTRATE 25 MG PO TABS: 12.5000 mg | ORAL_TABLET | Freq: Two times a day (BID) | ORAL | 3 refills | Status: DC

## 2018-01-09 MED ORDER — LISINOPRIL 2.5 MG PO TABS: 2.5000 mg | ORAL_TABLET | Freq: Every day | ORAL | 3 refills | Status: DC

## 2018-01-09 NOTE — Patient Instructions (Signed)
NO ChaNGE WITH MEDICATIONS    LABS   LIPID CMP    Your physician recommends that you schedule a follow-up appointment in  [place] wants you to follow-up in 12 month with dr harding. You will receive a reminder letter in the mail two months in advance. If you don't receive a letter, please call our office to schedule the follow-up appointment.   If you need a refill on your cardiac medications before your next appointment, please call your pharmacy.

## 2018-01-09 NOTE — Progress Notes (Addendum)
PCP: Tonia Ghent, MD  Clinic Note: Chief Complaint  Patient presents with  . annual f/u visit    pt states no Sx.  . Coronary Artery Disease  . Hyperlipidemia    HPI: [name] [name] is a 57 y.o. male with a PMH below who presents today for annual year follow-up for CAD.. In January 2015 had an inferior STEMI with PCI to the RCA using a Xience DES stent. I last saw him in December 2015.In addition to his CAD, he most notably has dyslipidemia with hypertriglyceridemia. He has diabetes that has been moderately controlled.  He was last seen in February 2018: Doing well  Recent Hospitalizations: None  Studies Reviewed:   None  Interval History: [name] [name] presents today doing overall pretty well from a cardiac standpoint.  He has not had any further episodes of near syncope.  These are all starting to get much better.  He is back doing exercises 6 days a week.  No further confusion spells.  No rapid irregular heartbeats or palpitations.  No TIA or amaurosis fugax symptoms.  He denies any chest tightness or pressure with rest or exertion.  No PND, orthopnea or edema.  He denies any melena, hematochezia or hematuria.  Overall stable.  No claudication.  ROS: A comprehensive was performed. Review of Systems  Constitutional: Negative for malaise/fatigue.  HENT: Negative for congestion (Allergy related) and sinus pain (Allergy related).   Respiratory: Negative for cough (From allergies), shortness of breath and wheezing.   Gastrointestinal: Negative for abdominal pain, constipation and heartburn.  Genitourinary: Negative for frequency and hematuria.  Musculoskeletal: Positive for joint pain.  Skin: Negative.   Neurological: Positive for dizziness.       No longer having near syncopal episodes  Endo/Heme/Allergies: Positive for environmental allergies. Does not bruise/bleed easily.  Psychiatric/Behavioral: Negative for depression and memory loss. The patient is not  nervous/anxious and does not have insomnia.   All other systems reviewed and are negative.   Past Medical History:  Diagnosis Date  . Allergic rhinitis   . CAD S/P percutaneous coronary angioplasty 12/03/2013   PCI-RCA - Xience Alpine DES 3.0 mm x 18 mm --> 3.5 mm; Echo 1/'15" EF 45-50%, mild C LVH, mild Inf HK, Gr 1 DD  . Diabetes mellitus    type2  . Fracture of right hand 1983  . Hyperlipidemia   . Hypertension   . Nocturia   . Paresthesia    tingling in feet  . STEMI (ST elevation myocardial infarction) of inf. wall 12/03/2013    Past Surgical History:  Procedure Laterality Date  . KNEE SURGERY     B arthroscopic  . LEFT HEART CATHETERIZATION WITH CORONARY ANGIOGRAM N/A 12/03/2013   Procedure: LEFT HEART CATHETERIZATION WITH CORONARY ANGIOGRAM;  Surgeon: Leonie Man, MD;  Location[facility] C[facility] LAB;  Service: Cardiovascular;  Laterality: N/A;  . NM MYOVIEW LTD  11/2015   Low risk stress test. Normal perfusion. Normal pump function. EF 50-55%  . PERCUTANEOUS CORONARY STENT INTERVENTION (PCI-S)  12/03/2013   mRCA - Xience Alpine DES 3.0 mm x 18 mm --> 3.5 mm  . SEPTOPLASTY  1970   due to Fx Nose  . TRANSTHORACIC ECHOCARDIOGRAM  12/03/2013; 11/2014   a. Mild Conc LVH; EF 45-50%, Mild Inferior HK. Grade 1 DD;; b. EF 55-60%. No RWMA. Moderate RV dilation but normal function.    Current Meds  Medication Sig  . atorvastatin (LIPITOR) 40 MG tablet Take 1 tablet (40 mg total)  by mouth daily.  . fenofibrate 160 MG tablet Take 1 tablet (160 mg total) by mouth daily.  . folic acid ([employer]S FOLIC ACID) 923 MCG tablet Take 1 tablet (400 mcg total) by mouth daily.  Marland Kitchen glipiZIDE (GLUCOTROL) 5 MG tablet TAKE 1 TABLET BY MOUTH  DAILY BEFORE BREAKFAST  . glucose blood (ONETOUCH VERIO) test strip Use as instructed to check blood sugar daily and as needed.  Diagnosis: E11.49  Non insulin dependent.  Marland Kitchen lisinopril (PRINIVIL,ZESTRIL) 2.5 MG tablet Take 1 tablet (2.5 mg total) by mouth daily.  . metFORMIN  (GLUCOPHAGE) 500 MG tablet TAKE 1 TABLET BY MOUTH  TWICE A DAY WITH MEALS  . metoprolol tartrate (LOPRESSOR) 25 MG tablet Take 0.5 tablets (12.5 mg total) by mouth 2 (two) times daily.  Marland Kitchen NITROSTAT 0.4 MG SL tablet DISSOLVE 1 TABLET UNDER THE TONGUE FOR CHEST PAIN. MAY REPEAT EVERY 5MINUTES UP TO 3 DOSES. IF NO RELIEF, CALL 911**  . OMEGA-3 KRILL OIL 300 MG CAPS Take 1 capsule by mouth daily.   Glory Rosebush DELICA LANCETS 30Q MISC Use as instructed to check blood sugar once daily or as needed.  Diagnosis:  E11.49  Non insulin dependent.  . ticagrelor (BRILINTA) 90 MG TABS tablet Take 1 tablet (90 mg total) by mouth 2 (two) times daily.  . vitamin B-12 (CYANOCOBALAMIN) 1000 MCG tablet Take 1 tablet (1,000 mcg total) by mouth daily.  . vitamin B-6 (PYRIDOXINE) 25 MG tablet Take 1 tablet (25 mg total) by mouth daily.  . [DISCONTINUED] atorvastatin (LIPITOR) 40 MG tablet TAKE 1 TABLET BY MOUTH  DAILY  . [DISCONTINUED] BRILINTA 90 MG TABS tablet TAKE 1 TABLET (90 MG TOTAL) BY MOUTH 2 (TWO) TIMES DAILY.  . [DISCONTINUED] fenofibrate 160 MG tablet TAKE 1 TABLET BY MOUTH  DAILY  . [DISCONTINUED] lisinopril (PRINIVIL,ZESTRIL) 2.5 MG tablet TAKE 1 TABLET BY MOUTH  DAILY  . [DISCONTINUED] metoprolol tartrate (LOPRESSOR) 25 MG tablet TAKE ONE-HALF TABLET BY  MOUTH TWICE A DAY    Allergies  Allergen Reactions  . Metformin And Related     Intolerant of more than 1000mg  a day    Social History   Socioeconomic History  . Marital status: Married    Spouse name: None  . Number of children: 1  . Years of education: None  . Highest education level: None  Social Needs  . Financial resource strain: None  . Food insecurity - worry: None  . Food insecurity - inability: None  . Transportation needs - medical: None  . Transportation needs - non-medical: None  Occupational History  . Occupation: Engineer, materials: [employer]    Comment: Patent attorney  Tobacco Use  .  Smoking status: Former Smoker    Packs/day: 0.50    Years: 20.00    Pack years: 10.00    Types: Cigarettes    Last attempt to quit: 12/03/2013    Years since quitting: 4.1  . Smokeless tobacco: Never Used  Substance and Sexual Activity  . Alcohol use: Yes    Comment: occ on weekends  . Drug use: No  . Sexual activity: None  Other Topics Concern  . None  Social History Narrative   Married 1988 and lives with wife   One son   Works at Museum/gallery exhibitions officer, Librarian, academic.   Forward 10 pack year smoker, who quit on the day of his MI.    family history includes Cancer in his father and mother; Diabetes  in his father; Hearing loss in his brother; Heart disease in his paternal grandfather; Hyperlipidemia in his father; Hypertension in his father.  Wt Readings from Last 3 Encounters:  01/09/18 212 lb (96.2 kg)  10/29/17 212 lb (96.2 kg)  07/29/17 212 lb 8 oz (96.4 kg)    PHYSICAL EXAM BP 116/74 (BP Location: Left Arm, Patient Position: Sitting, Cuff Size: Normal)   Pulse 86   Ht 5' 10.5" (1.791 m)   Wt 212 lb (96.2 kg)   BMI 29.99 kg/m   Physical Exam  Constitutional: He is oriented to person, place, and time. He appears well-developed and well-nourished. No distress.  Borderline obese.  Otherwise healthy-appearing.  Well-groomed  HENT:  Head: Normocephalic and atraumatic.  Eyes: EOM are normal.  Neck: No hepatojugular reflux and no JVD present. Carotid bruit is not present.  Cardiovascular: Normal rate, regular rhythm, normal heart sounds and normal pulses.  No extrasystoles are present. PMI is not displaced. Exam reveals no gallop.  No murmur heard. Pulmonary/Chest: Effort normal and breath sounds normal. No respiratory distress. He has no wheezes. He has no rales.  Abdominal: Soft. Bowel sounds are normal. He exhibits no distension. There is no tenderness.  Musculoskeletal: Normal range of motion. He exhibits no edema.  Neurological: He is alert and oriented to person,  place, and time.  Psychiatric: He has a normal mood and affect. His behavior is normal. Judgment and thought content normal.  Nursing note and vitals reviewed.  General appearance: alert, cooperative, appears stated age, no distress and mildly obese; wall nourished well groomed Neck: no adenopathy, no carotid bruit and no JVD Lungs: clear to auscultation bilaterally, normal percussion bilaterally and non-labored Heart: regular rate and rhythm, S1 and S2 normal, no murmur, click, rub or gallop; nondisplaced PMI Abdomen: soft, non-tender; bowel sounds normal; no masses,  no organomegaly; no HJR Extremities: extremities normal, atraumatic, no cyanosis, or edema  Pulses: 2+ and symmetric;  Skin: mobility and turgor normal, no evidence of bleeding or bruising and no lesions noted Neurologic: Mental status: Alert, oriented, thought content appropriate   Adult ECG Report  Rate: 86;  Rhythm: normal sinus rhythm and Inferior MI, age undetermined. Otherwise normal axis, intervals and durations.;   Narrative Interpretation: Stable EKG   Other studies Reviewed: Additional studies/ records that were reviewed today include:  Recent Labs:   Lab Results  Component Value Date   CHOL 236 (H) 07/24/2017   HDL 31.40 (L) 07/24/2017   LDLCALC 86 05/10/2014   LDLDIRECT 91.0 07/24/2017   TRIG (H) 07/24/2017    738.0 Triglyceride is over 400; calculations on Lipids are invalid.   CHOLHDL 8 07/24/2017    ASSESSMENT / PLAN:  Problem List Items Addressed This Visit    CAD S/P percutaneous coronary angioplasty - Promus Premier DES 3.0 mm x 18 mm (3.5 mm) to mid RCA occlusion; no significant left-sided disease. - Primary (Chronic)    No recurrent angina on current medications. From a cardiac standpoint he is on stable low-dose beta-blocker and ACE inhibitor with well-controlled blood pressure.  Would not titrate further simply because of his dizzy spells in the past.   Post PCI on Brilinta alone.  No  bleeding       Relevant Medications   atorvastatin (LIPITOR) 40 MG tablet   fenofibrate 160 MG tablet   lisinopril (PRINIVIL,ZESTRIL) 2.5 MG tablet   metoprolol tartrate (LOPRESSOR) 25 MG tablet   Other Relevant Orders   EKG 12-Lead   Diabetes mellitus type 2  with neurological manifestations ([place]) (Chronic)    He has significant hypertriglyceridemia probably related to his diabetes.  May benefit from being on Jardiance or Januvia along with metformin.  Defer to PCP.      Relevant Medications   atorvastatin (LIPITOR) 40 MG tablet   lisinopril (PRINIVIL,ZESTRIL) 2.5 MG tablet   Other Relevant Orders   Lipid panel   Comprehensive metabolic panel   Essential hypertension (Chronic)    Blood pressure well controlled today on low-dose lisinopril and metoprolol.  Would not further titrate based on his prior dizzy spells.      Relevant Medications   atorvastatin (LIPITOR) 40 MG tablet   fenofibrate 160 MG tablet   lisinopril (PRINIVIL,ZESTRIL) 2.5 MG tablet   metoprolol tartrate (LOPRESSOR) 25 MG tablet   HYPERLIPIDEMIA, MIXED -- significant hypertriglyceridemia (Chronic)     He is on fenofibrate and atorvastatin for his lipids, but LDL is not at goal and triglycerides remain significantly elevated. --Will need to have a new set of lipid panel checked.  Consider Vascepa.  WILL REFER TO CV RR (CARDIOVASCULAR RISK REDUCTION Clinic run by our clinical pharmacist) for additional assistance.      Relevant Medications   atorvastatin (LIPITOR) 40 MG tablet   fenofibrate 160 MG tablet   lisinopril (PRINIVIL,ZESTRIL) 2.5 MG tablet   metoprolol tartrate (LOPRESSOR) 25 MG tablet   Other Relevant Orders   Lipid panel   Comprehensive metabolic panel   Near syncope    No further episodes of near syncope.  Continue to monitor, but it was very difficult to figure out what was going on.  Symptoms simply seem to have resolved.      Relevant Medications   atorvastatin (LIPITOR) 40 MG tablet    fenofibrate 160 MG tablet   lisinopril (PRINIVIL,ZESTRIL) 2.5 MG tablet   metoprolol tartrate (LOPRESSOR) 25 MG tablet   Presence of drug coated stent in right coronary artery: Promus Premier DES 3.0 mm x 18 mm (3.5 mm) to mid RCA occlusion 12/03/12 (Chronic)    He is now on Brilinta alone without aspirin.  Almost 5 years out from his PCI.  We could consider converting to Plavix if Brilinta becomes a financial concern.  May also consider converting to Xarelto 2.5 mg twice daily plus aspirin.      ST elevation myocardial infarction (STEMI) of inferior wall, subsequent episode of care ([place]) (Chronic)    [name] [name] is doing relatively well overall from a cardiac standpoint.  He has no further anginal symptoms or worsening near syncopal symptoms.  Despite being post MI, he is not having any anginal or heart failure symptoms.  Preserved EF on echo.      Relevant Medications   atorvastatin (LIPITOR) 40 MG tablet   fenofibrate 160 MG tablet   lisinopril (PRINIVIL,ZESTRIL) 2.5 MG tablet   metoprolol tartrate (LOPRESSOR) 25 MG tablet   Other Relevant Orders   EKG 12-Lead   Comprehensive metabolic panel      Current medicines are reviewed at length with the patient today. (+/- concerns) n/a The following changes have been made: -- see below  Patient Instructions   NO [place]    Your physician recommends that you schedule a follow-up appointment in  [place] wants you to follow-up in 12 month with dr Tin Engram. You will receive a reminder letter in the mail two months in advance. If you don't receive a letter, please call our office  to schedule the follow-up appointment.   If you need a refill on your cardiac medications before your next appointment, please call your pharmacy.    Studies Ordered:   Orders Placed This Encounter  Procedures  . Lipid panel  . Comprehensive metabolic panel  . EKG 12-Lead      Glenetta Hew, M.D., M.S. Interventional Cardiologist   Pager # [phone] Phone # [phone] [address] [place], [place] [address]

## 2018-01-11 ENCOUNTER — Encounter: Payer: Self-pay | Admitting: Cardiology

## 2018-01-12 NOTE — Assessment & Plan Note (Signed)
[name] [name] is doing relatively well overall from a cardiac standpoint.  He has no further anginal symptoms or worsening near syncopal symptoms.  Despite being post MI, he is not having any anginal or heart failure symptoms.  Preserved EF on echo.

## 2018-01-12 NOTE — Assessment & Plan Note (Signed)
No recurrent angina on current medications. From a cardiac standpoint he is on stable low-dose beta-blocker and ACE inhibitor with well-controlled blood pressure.  Would not titrate further simply because of his dizzy spells in the past.   Post PCI on Brilinta alone.  No bleeding

## 2018-01-12 NOTE — Assessment & Plan Note (Signed)
No further episodes of near syncope.  Continue to monitor, but it was very difficult to figure out what was going on.  Symptoms simply seem to have resolved.

## 2018-01-12 NOTE — Assessment & Plan Note (Signed)
Blood pressure well controlled today on low-dose lisinopril and metoprolol.  Would not further titrate based on his prior dizzy spells.

## 2018-01-12 NOTE — Assessment & Plan Note (Signed)
He has significant hypertriglyceridemia probably related to his diabetes.  May benefit from being on Jardiance or Januvia along with metformin.  Defer to PCP.

## 2018-01-12 NOTE — Assessment & Plan Note (Signed)
He is now on Brilinta alone without aspirin.  Almost 5 years out from his PCI.  We could consider converting to Plavix if Brilinta becomes a financial concern.  May also consider converting to Xarelto 2.5 mg twice daily plus aspirin.

## 2018-01-12 NOTE — Assessment & Plan Note (Signed)
  He is on fenofibrate and atorvastatin for his lipids, but LDL is not at goal and triglycerides remain significantly elevated. --Will need to have a new set of lipid panel checked.  Consider Vascepa.  WILL REFER TO CV RR (CARDIOVASCULAR RISK REDUCTION Clinic run by our clinical pharmacist) for additional assistance.

## 2018-01-15 ENCOUNTER — Ambulatory Visit (INDEPENDENT_AMBULATORY_CARE_PROVIDER_SITE_OTHER): Payer: 59 | Admitting: Family Medicine

## 2018-01-15 ENCOUNTER — Encounter: Payer: Self-pay | Admitting: Family Medicine

## 2018-01-15 VITALS — BP 120/70 | HR 74 | Temp 98.1°F | Wt 209.0 lb

## 2018-01-15 DIAGNOSIS — J019 Acute sinusitis, unspecified: Secondary | ICD-10-CM | POA: Diagnosis not present

## 2018-01-15 NOTE — Progress Notes (Signed)
BP 120/70 (BP Location: Left Arm, Patient Position: Sitting, Cuff Size: Normal)   Pulse 74   Temp 98.1 F (36.7 C) (Oral)   Wt 209 lb (94.8 kg)   SpO2 95%   BMI 29.56 kg/m    CC: sinus congestion Subjective:    Patient ID: [name] [name], male    DOB: 03/07/1961, 57 y.o.   MRN: [id number]  HPI: [name] [name] is a 57 y.o. male presenting on 01/15/2018 for Sinus Problem (Sinus congestion- green mucous and severe sinus pressure. Concerned congestion is moving to his chest. Also c/o fatigue. Started 01/12/18. Tried Mucinex and Nyquil.)   3-4d h/o ST with progression to sinus pressure, pain, fatigue, malaise. Initial sneezing, sore throat and HA that now is better. Mild cough. PNDrainage. Today is the worst day so far.  Out of work the past 3 days.   No fevers/chills, ear or tooth pain, dyspnea or wheezing.  So far has tried tylenol and mucinex and nyquil. He has been using afrin.   Ex smoker - quit 2015.  No h/o asthma.  Wife and son sick recently.  Known diabetes and CAD s/p stent.   Relevant past medical, surgical, family and social history reviewed and updated as indicated. Interim medical history since our last visit reviewed. Allergies and medications reviewed and updated. Outpatient Medications Prior to Visit  Medication Sig Dispense Refill  . atorvastatin (LIPITOR) 40 MG tablet Take 1 tablet (40 mg total) by mouth daily. 90 tablet 3  . fenofibrate 160 MG tablet Take 1 tablet (160 mg total) by mouth daily. 90 tablet 3  . folic acid ([employer]S FOLIC ACID) 381 MCG tablet Take 1 tablet (400 mcg total) by mouth daily.    Marland Kitchen glipiZIDE (GLUCOTROL) 5 MG tablet TAKE 1 TABLET BY MOUTH  DAILY BEFORE BREAKFAST 90 tablet 1  . glucose blood (ONETOUCH VERIO) test strip Use as instructed to check blood sugar daily and as needed.  Diagnosis: E11.49  Non insulin dependent. 100 each 3  . lisinopril (PRINIVIL,ZESTRIL) 2.5 MG tablet Take 1 tablet (2.5 mg total) by mouth daily. 90 tablet  3  . metFORMIN (GLUCOPHAGE) 500 MG tablet TAKE 1 TABLET BY MOUTH  TWICE A DAY WITH MEALS 180 tablet 3  . metoprolol tartrate (LOPRESSOR) 25 MG tablet Take 0.5 tablets (12.5 mg total) by mouth 2 (two) times daily. 90 tablet 3  . NITROSTAT 0.4 MG SL tablet DISSOLVE 1 TABLET UNDER THE TONGUE FOR CHEST PAIN. MAY REPEAT EVERY 5MINUTES UP TO 3 DOSES. IF NO RELIEF, CALL 911** 25 tablet 1  . OMEGA-3 KRILL OIL 300 MG CAPS Take 1 capsule by mouth daily.     Glory Rosebush DELICA LANCETS 01B MISC Use as instructed to check blood sugar once daily or as needed.  Diagnosis:  E11.49  Non insulin dependent. 100 each 3  . ticagrelor (BRILINTA) 90 MG TABS tablet Take 1 tablet (90 mg total) by mouth 2 (two) times daily. 180 tablet 3  . vitamin B-12 (CYANOCOBALAMIN) 1000 MCG tablet Take 1 tablet (1,000 mcg total) by mouth daily.    . vitamin B-6 (PYRIDOXINE) 25 MG tablet Take 1 tablet (25 mg total) by mouth daily.     No facility-administered medications prior to visit.      Per HPI unless specifically indicated in ROS section below Review of Systems     Objective:    BP 120/70 (BP Location: Left Arm, Patient Position: Sitting, Cuff Size: Normal)   Pulse 74  Temp 98.1 F (36.7 C) (Oral)   Wt 209 lb (94.8 kg)   SpO2 95%   BMI 29.56 kg/m   Wt Readings from Last 3 Encounters:  01/15/18 209 lb (94.8 kg)  01/09/18 212 lb (96.2 kg)  10/29/17 212 lb (96.2 kg)    Physical Exam  Constitutional: He appears well-developed and well-nourished. No distress.  HENT:  Head: Normocephalic and atraumatic.  Right Ear: Hearing, tympanic membrane, external ear and ear canal normal.  Left Ear: Hearing, tympanic membrane, external ear and ear canal normal.  Nose: Mucosal edema (nasal mucosal erythema and congestion L>R) and rhinorrhea present. Right sinus exhibits no maxillary sinus tenderness and no frontal sinus tenderness. Left sinus exhibits no frontal sinus tenderness.  Mouth/Throat: Uvula is midline, oropharynx is  clear and moist and mucous membranes are normal. No oropharyngeal exudate, posterior oropharyngeal edema, posterior oropharyngeal erythema or tonsillar abscesses.  Cobblestoning of pharynx  Eyes: Conjunctivae and EOM are normal. Pupils are equal, round, and reactive to light. No scleral icterus.  Neck: Normal range of motion. Neck supple.  Cardiovascular: Normal rate, regular rhythm, normal heart sounds and intact distal pulses.  No murmur heard. Pulmonary/Chest: Effort normal and breath sounds normal. No respiratory distress. He has no wheezes. He has no rales.  Lymphadenopathy:    He has no cervical adenopathy.  Skin: Skin is warm and dry. No rash noted.  Nursing note and vitals reviewed.  Results for orders placed or performed in visit on 10/16/17  Hemoglobin A1c  Result Value Ref Range   Hgb A1c MFr Bld 8.6 (H) 4.6 - 6.5 %      Assessment & Plan:   Problem List Items Addressed This Visit    Acute sinusitis - Primary    Anticipate viral given short duration. Supportive care reviewed. rec start flonase, continue mucinex, tylenol, nasal saline. Red flags to update [place] for antibiotic course reviewed. Low threshold to start abx given comorbidities.           No orders of the defined types were placed in this encounter.  No orders of the defined types were placed in this encounter.   Follow up plan: Return if symptoms worsen or fail to improve.  Ria Bush, MD

## 2018-01-15 NOTE — Patient Instructions (Addendum)
You have a viral sinus infection. Push fluids and plenty of rest. Nasal saline irrigation or neti pot to help drain sinuses. May use plain mucinex with plenty of fluid to help mobilize mucous. Start taking flonase.  Please let us know if fever >101.5, trouble opening/closing mouth, difficulty swallowing, or worsening instead of improving as expected.   Sinusitis, Adult Sinusitis is soreness and inflammation of your sinuses. Sinuses are hollow spaces in the bones around your face. Your sinuses are located:  Around your eyes.  In the middle of your forehead.  Behind your nose.  In your cheekbones.  Your sinuses and nasal passages are lined with a stringy fluid (mucus). Mucus normally drains out of your sinuses. When your nasal tissues become inflamed or swollen, the mucus can become trapped or blocked so air cannot flow through your sinuses. This allows bacteria, viruses, and funguses to grow, which leads to infection. Sinusitis can develop quickly and last for 7?10 days (acute) or for more than 12 weeks (chronic). Sinusitis often develops after a cold. What are the causes? This condition is caused by anything that creates swelling in the sinuses or stops mucus from draining, including:  Allergies.  Asthma.  Bacterial or viral infection.  Abnormally shaped bones between the nasal passages.  Nasal growths that contain mucus (nasal polyps).  Narrow sinus openings.  Pollutants, such as chemicals or irritants in the air.  A foreign object stuck in the nose.  A fungal infection. This is rare.  What increases the risk? The following factors may make you more likely to develop this condition:  Having allergies or asthma.  Having had a recent cold or respiratory tract infection.  Having structural deformities or blockages in your nose or sinuses.  Having a weak immune system.  Doing a lot of swimming or diving.  Overusing nasal sprays.  Smoking.  What are the signs or  symptoms? The main symptoms of this condition are pain and a feeling of pressure around the affected sinuses. Other symptoms include:  Upper toothache.  Earache.  Headache.  Bad breath.  Decreased sense of smell and taste.  A cough that may get worse at night.  Fatigue.  Fever.  Thick drainage from your nose. The drainage is often green and it may contain pus (purulent).  Stuffy nose or congestion.  Postnasal drip. This is when extra mucus collects in the throat or back of the nose.  Swelling and warmth over the affected sinuses.  Sore throat.  Sensitivity to light.  How is this diagnosed? This condition is diagnosed based on symptoms, a medical history, and a physical exam. To find out if your condition is acute or chronic, your health care provider may:  Look in your nose for signs of nasal polyps.  Tap over the affected sinus to check for signs of infection.  View the inside of your sinuses using an imaging device that has a light attached (endoscope).  If your health care provider suspects that you have chronic sinusitis, you may also:  Be tested for allergies.  Have a sample of mucus taken from your nose (nasal culture) and checked for bacteria.  Have a mucus sample examined to see if your sinusitis is related to an allergy.  If your sinusitis does not respond to treatment and it lasts longer than 8 weeks, you may have an MRI or CT scan to check your sinuses. These scans also help to determine how severe your infection is. In rare cases, a bone biopsy  may be done to rule out more serious types of fungal sinus disease. How is this treated? Treatment for sinusitis depends on the cause and whether your condition is chronic or acute. If a virus is causing your sinusitis, your symptoms will go away on their own within 10 days. You may be given medicines to relieve your symptoms, including:  Topical nasal decongestants. They shrink swollen nasal passages and let  mucus drain from your sinuses.  Antihistamines. These drugs block inflammation that is triggered by allergies. This can help to ease swelling in your nose and sinuses.  Topical nasal corticosteroids. These are nasal sprays that ease inflammation and swelling in your nose and sinuses.  Nasal saline washes. These rinses can help to get rid of thick mucus in your nose.  If your condition is caused by bacteria, you will be given an antibiotic medicine. If your condition is caused by a fungus, you will be given an antifungal medicine. Surgery may be needed to correct underlying conditions, such as narrow nasal passages. Surgery may also be needed to remove polyps. Follow these instructions at home: Medicines  Take, use, or apply over-the-counter and prescription medicines only as told by your health care provider. These may include nasal sprays.  If you were prescribed an antibiotic medicine, take it as told by your health care provider. Do not stop taking the antibiotic even if you start to feel better. Hydrate and Humidify  Drink enough water to keep your urine clear or pale yellow. Staying hydrated will help to thin your mucus.  Use a cool mist humidifier to keep the humidity level in your home above 50%.  Inhale steam for 10-15 minutes, 3-4 times a day or as told by your health care provider. You can do this in the bathroom while a hot shower is running.  Limit your exposure to cool or dry air. Rest  Rest as much as possible.  Sleep with your head raised (elevated).  Make sure to get enough sleep each night. General instructions  Apply a warm, moist washcloth to your face 3-4 times a day or as told by your health care provider. This will help with discomfort.  Wash your hands often with soap and water to reduce your exposure to viruses and other germs. If soap and water are not available, use hand sanitizer.  Do not smoke. Avoid being around people who are smoking (secondhand  smoke).  Keep all follow-up visits as told by your health care provider. This is important. Contact a health care provider if:  You have a fever.  Your symptoms get worse.  Your symptoms do not improve within 10 days. Get help right away if:  You have a severe headache.  You have persistent vomiting.  You have pain or swelling around your face or eyes.  You have vision problems.  You develop confusion.  Your neck is stiff.  You have trouble breathing. This information is not intended to replace advice given to you by your health care provider. Make sure you discuss any questions you have with your health care provider. Document Released: 11/18/2005 Document Revised: 07/14/2016 Document Reviewed: 09/13/2015 Elsevier Interactive Patient Education  [employer].

## 2018-01-15 NOTE — Assessment & Plan Note (Signed)
Anticipate viral given short duration. Supportive care reviewed. rec start flonase, continue mucinex, tylenol, nasal saline. Red flags to update [place] for antibiotic course reviewed. Low threshold to start abx given comorbidities.

## 2018-01-19 ENCOUNTER — Other Ambulatory Visit: Payer: Self-pay | Admitting: Family Medicine

## 2018-01-19 DIAGNOSIS — E1149 Type 2 diabetes mellitus with other diabetic neurological complication: Secondary | ICD-10-CM

## 2018-01-22 ENCOUNTER — Other Ambulatory Visit (INDEPENDENT_AMBULATORY_CARE_PROVIDER_SITE_OTHER): Payer: 59

## 2018-01-22 DIAGNOSIS — I2119 ST elevation (STEMI) myocardial infarction involving other coronary artery of inferior wall: Secondary | ICD-10-CM | POA: Diagnosis not present

## 2018-01-22 DIAGNOSIS — E1149 Type 2 diabetes mellitus with other diabetic neurological complication: Secondary | ICD-10-CM

## 2018-01-22 DIAGNOSIS — E782 Mixed hyperlipidemia: Secondary | ICD-10-CM | POA: Diagnosis not present

## 2018-01-22 LAB — HEMOGLOBIN A1C: Hgb A1c MFr Bld: 9.5 % — ABNORMAL HIGH (ref 4.6–6.5)

## 2018-01-23 LAB — COMPREHENSIVE METABOLIC PANEL
ALT: 35 IU/L (ref 0–44)
AST: 24 IU/L (ref 0–40)
Albumin/Globulin Ratio: 2 (ref 1.2–2.2)
Albumin: 4.8 g/dL (ref 3.5–5.5)
Alkaline Phosphatase: 57 IU/L (ref 39–117)
BUN/Creatinine Ratio: 14 (ref 9–20)
BUN: 12 mg/dL (ref 6–24)
Bilirubin Total: 0.3 mg/dL (ref 0.0–1.2)
CALCIUM: 9.6 mg/dL (ref 8.7–10.2)
CO2: 21 mmol/L (ref 20–29)
CREATININE: 0.83 mg/dL (ref 0.76–1.27)
Chloride: 102 mmol/L (ref 96–106)
GFR, EST AFRICAN AMERICAN: 112 mL/min/{1.73_m2} (ref >59)
GFR, EST NON AFRICAN AMERICAN: 97 mL/min/{1.73_m2} (ref >59)
Globulin, Total: 2.4 g/dL (ref 1.5–4.5)
Glucose: 243 mg/dL — ABNORMAL HIGH (ref 65–99)
Potassium: 5 mmol/L (ref 3.5–5.2)
Sodium: 139 mmol/L (ref 134–144)
TOTAL PROTEIN: 7.2 g/dL (ref 6.0–8.5)

## 2018-01-23 LAB — LIPID PANEL
CHOLESTEROL TOTAL: 212 mg/dL — AB (ref 100–199)
Chol/HDL Ratio: 7.1 ratio — ABNORMAL HIGH (ref 0.0–5.0)
HDL: 30 mg/dL — ABNORMAL LOW (ref >39)
Triglycerides: 642 mg/dL (ref 0–149)

## 2018-01-23 LAB — SPECIMEN STATUS REPORT

## 2018-01-28 ENCOUNTER — Ambulatory Visit: Payer: 59 | Admitting: Family Medicine

## 2018-01-28 ENCOUNTER — Telehealth: Payer: Self-pay | Admitting: Cardiology

## 2018-01-28 NOTE — Telephone Encounter (Signed)
Closed Encounter  °

## 2018-01-29 DIAGNOSIS — H903 Sensorineural hearing loss, bilateral: Secondary | ICD-10-CM | POA: Diagnosis not present

## 2018-01-30 ENCOUNTER — Telehealth: Payer: Self-pay | Admitting: *Deleted

## 2018-01-30 NOTE — Telephone Encounter (Signed)
-----   Message from Leonie Man, MD sent at 01/25/2018  8:15 PM EST ----- Lipid panel - triglycerides are only a fraction better this year than 6 months ago.  Hopefully, our pharmacist team will be able to figure out a regimen that will actually get your Triglycerides down enough to actually calculate your LDL.  Most definitely will need more aggressive medicines to get these levels under control -- will also discuss diet adjustments.  Glenetta Hew, MD

## 2018-01-30 NOTE — Telephone Encounter (Signed)
LEFT MESSAGE  FOR PATIENT TO CALL BACK TO SET UP APPT WITH [facility] -PHARMACIST- DISCUSS- CHOLESTEROL LEVELS

## 2018-02-02 ENCOUNTER — Ambulatory Visit (INDEPENDENT_AMBULATORY_CARE_PROVIDER_SITE_OTHER): Payer: 59 | Admitting: Family Medicine

## 2018-02-02 ENCOUNTER — Encounter: Payer: Self-pay | Admitting: Family Medicine

## 2018-02-02 DIAGNOSIS — E1149 Type 2 diabetes mellitus with other diabetic neurological complication: Secondary | ICD-10-CM

## 2018-02-02 MED ORDER — BASAGLAR KWIKPEN 100 UNIT/ML ~~LOC~~ SOPN: 5.0000 [IU] | PEN_INJECTOR | Freq: Every day | SUBCUTANEOUS | 12 refills | Status: DC

## 2018-02-02 MED ORDER — INSULIN PEN NEEDLE 31G X 5 MM MISC: 3 refills | Status: DC

## 2018-02-02 NOTE — Assessment & Plan Note (Addendum)
4 options D&E D&E with more oral agents D&E with injection D&E with endocrine referral.    He opts for starting daily insulin injection with continued work on diet and exercise.  I think this is a reasonable option at this point.  We talked about insulin pen and options.  Routine instructions given and discussed with patient.  He will check for coverage on basaglar, Lantus, Levemir.  He will see which one he can get covered and then will come back for further insulin teaching.  Discussed rationale for plan.  He agrees. >15 minutes spent in face to face time with patient, >50% spent in counselling or coordination of care

## 2018-02-02 NOTE — Patient Instructions (Signed)
Price check basaglar.  Levemir or lantus may be cheaper.  When you get the pens, schedule a visit and I'll coach you on injection.  If you can't get the pens then let me know.  Take care.  Glad to see you.

## 2018-02-02 NOTE — Progress Notes (Signed)
Diabetes:  Using medications without difficulties:yes, but with elevated sugar Hypoglycemic episodes:no Hyperglycemic episodes:up to 246 this AM Feet problems: at baseline with tingling and burning.   Blood Sugars averaging: ~200 eye exam within last year:due, d/w pt.  A1c up.  Diet and exercise have been off.  D/w pt about options.    PMH and SH reviewed  Meds, vitals, and allergies reviewed.   ROS: Per HPI unless specifically indicated in ROS section   GEN: nad, alert and oriented HEENT: mucous membranes moist NECK: supple w/o LA CV: rrr. PULM: ctab, no inc wob ABD: soft, +bs EXT: no edema

## 2018-02-17 ENCOUNTER — Encounter: Payer: Self-pay | Admitting: *Deleted

## 2018-02-18 ENCOUNTER — Telehealth: Payer: Self-pay

## 2018-02-18 NOTE — Telephone Encounter (Signed)
Noted.  Thanks.  I'll await update from patient and we'll go from there.

## 2018-02-18 NOTE — Telephone Encounter (Signed)
How does he feel?  How much insulin is he taking?  Is he having trouble with the insulin injections? Thanks.

## 2018-02-18 NOTE — Telephone Encounter (Signed)
Copied from [place]. Topic: Quick Communication - Office Called Patient >> Feb 16, 2018  6:35 PM Josetta Huddle, [place] wrote: Reason for CRM: get an update on his sugar readings and how he is doing.  Please take this information if patient returns call.    >> Feb 18, 2018 11:32 AM Antonieta Iba C wrote: Pt says his blood sugar reading for yesterday morning was 217 and his reading for this morning is 219.

## 2018-02-18 NOTE — Telephone Encounter (Signed)
Spoke to patient by telephone and was advised that he is feeling fine. Patient stated that he is dieting and exercising. Patient stated that he has not picked the insulin up yet. Patient stated that he is going out of town and will be back Monday. Patient stated that he plans on picking up the insulin next week and will call and schedule an appointment to go over how to do the injections.

## 2018-02-22 ENCOUNTER — Other Ambulatory Visit: Payer: Self-pay | Admitting: Family Medicine

## 2018-03-15 ENCOUNTER — Other Ambulatory Visit: Payer: Self-pay | Admitting: Cardiology

## 2018-03-29 ENCOUNTER — Other Ambulatory Visit: Payer: Self-pay | Admitting: Cardiology

## 2018-03-30 ENCOUNTER — Other Ambulatory Visit: Payer: Self-pay

## 2018-03-30 MED ORDER — TICAGRELOR 90 MG PO TABS: 90.0000 mg | ORAL_TABLET | Freq: Two times a day (BID) | ORAL | 3 refills | Status: DC

## 2018-03-30 NOTE — Telephone Encounter (Signed)
REFILL 

## 2018-04-03 ENCOUNTER — Encounter: Payer: Self-pay | Admitting: Family Medicine

## 2018-04-09 ENCOUNTER — Encounter: Payer: Self-pay | Admitting: *Deleted

## 2018-05-11 ENCOUNTER — Encounter: Payer: Self-pay | Admitting: Family Medicine

## 2018-05-14 ENCOUNTER — Telehealth: Payer: Self-pay | Admitting: Family Medicine

## 2018-05-14 NOTE — Telephone Encounter (Signed)
See mychart message. Please check with patient about his meter order.  Thanks.  Elsie Stain

## 2018-05-14 NOTE — Telephone Encounter (Signed)
Patient is requesting the new meter that does not require fingerstick for testing.  We were told at the luncheon that day that most insurance companies will cover it.  However, the patient says that his insurance wants to know why he needs that particular meter.  Is there a legitimate reason?

## 2018-05-17 NOTE — Telephone Encounter (Signed)
He has insulin treated DM2, E11.49.  If that doesn't cover it, then I don't see other reasons.  Thanks.

## 2018-05-18 ENCOUNTER — Other Ambulatory Visit: Payer: Self-pay | Admitting: Family Medicine

## 2018-05-18 ENCOUNTER — Other Ambulatory Visit: Payer: Self-pay | Admitting: *Deleted

## 2018-05-18 MED ORDER — FREESTYLE LIBRE 14 DAY READER DEVI: 1.0000 | Freq: Three times a day (TID) | 0 refills | Status: DC | PRN

## 2018-05-18 MED ORDER — FREESTYLE LIBRE 14 DAY READER DEVI: 1.0000 | Freq: Every day | 0 refills | Status: DC

## 2018-05-18 NOTE — Telephone Encounter (Signed)
Spoke with patient.  Order sent to pharmacy as requested.

## 2018-05-19 NOTE — Telephone Encounter (Signed)
PA was requested and submitted through [facility] and denied stating the criteria is: 1) Diagnosis of Type I diabetes 2) Patient is on an intensive insulin regimen of 3 or more injections per day or continuous insulin pump. Patient advised and letter scanned.

## 2018-06-01 HISTORY — PX: NM MYOVIEW LTD: HXRAD82

## 2018-06-10 ENCOUNTER — Encounter (HOSPITAL_COMMUNITY): Payer: Self-pay | Admitting: Emergency Medicine

## 2018-06-10 ENCOUNTER — Ambulatory Visit: Payer: Self-pay | Admitting: *Deleted

## 2018-06-10 ENCOUNTER — Emergency Department (HOSPITAL_COMMUNITY)
Admission: EM | Admit: 2018-06-10 | Discharge: 2018-06-10 | Disposition: A | Discharge status: Home / Self Care | Payer: 59 | Attending: Emergency Medicine | Admitting: Emergency Medicine

## 2018-06-10 ENCOUNTER — Emergency Department (HOSPITAL_COMMUNITY): Payer: 59

## 2018-06-10 DIAGNOSIS — R0602 Shortness of breath: Secondary | ICD-10-CM | POA: Diagnosis not present

## 2018-06-10 DIAGNOSIS — I10 Essential (primary) hypertension: Secondary | ICD-10-CM | POA: Diagnosis not present

## 2018-06-10 DIAGNOSIS — R0789 Other chest pain: Secondary | ICD-10-CM

## 2018-06-10 DIAGNOSIS — Z794 Long term (current) use of insulin: Secondary | ICD-10-CM | POA: Insufficient documentation

## 2018-06-10 DIAGNOSIS — Z87891 Personal history of nicotine dependence: Secondary | ICD-10-CM | POA: Insufficient documentation

## 2018-06-10 DIAGNOSIS — E1149 Type 2 diabetes mellitus with other diabetic neurological complication: Secondary | ICD-10-CM | POA: Diagnosis not present

## 2018-06-10 DIAGNOSIS — Z79899 Other long term (current) drug therapy: Secondary | ICD-10-CM | POA: Insufficient documentation

## 2018-06-10 DIAGNOSIS — I2510 Atherosclerotic heart disease of native coronary artery without angina pectoris: Secondary | ICD-10-CM | POA: Insufficient documentation

## 2018-06-10 DIAGNOSIS — R079 Chest pain, unspecified: Secondary | ICD-10-CM | POA: Diagnosis not present

## 2018-06-10 LAB — I-STAT TROPONIN, ED
Troponin i, poc: 0 ng/mL (ref 0.00–0.08)
Troponin i, poc: 0 ng/mL (ref 0.00–0.08)

## 2018-06-10 LAB — BASIC METABOLIC PANEL
ANION GAP: 10 (ref 5–15)
BUN: 11 mg/dL (ref 6–20)
CO2: 24 mmol/L (ref 22–32)
Calcium: 10.4 mg/dL — ABNORMAL HIGH (ref 8.9–10.3)
Chloride: 101 mmol/L (ref 98–111)
Creatinine, Ser: 0.81 mg/dL (ref 0.61–1.24)
GFR calc Af Amer: >60 mL/min (ref >60)
GFR calc non Af Amer: >60 mL/min (ref >60)
GLUCOSE: 220 mg/dL — AB (ref 70–99)
POTASSIUM: 4.5 mmol/L (ref 3.5–5.1)
Sodium: 135 mmol/L (ref 135–145)

## 2018-06-10 LAB — CBC
HEMATOCRIT: 42.1 % (ref 39.0–52.0)
HEMOGLOBIN: 14.1 g/dL (ref 13.0–17.0)
MCH: 28.9 pg (ref 26.0–34.0)
MCHC: 33.5 g/dL (ref 30.0–36.0)
MCV: 86.3 fL (ref 78.0–100.0)
Platelets: 254 10*3/uL (ref 150–400)
RBC: 4.88 MIL/uL (ref 4.22–5.81)
RDW: 12.7 % (ref 11.5–15.5)
WBC: 6.4 10*3/uL (ref 4.0–10.5)

## 2018-06-10 MED ORDER — RANITIDINE HCL 150 MG PO TABS: 150.0000 mg | ORAL_TABLET | Freq: Two times a day (BID) | ORAL | 0 refills | Status: DC

## 2018-06-10 NOTE — ED Notes (Signed)
Pt ambulated to restroom. 

## 2018-06-10 NOTE — ED Notes (Signed)
ED Provider at bedside. 

## 2018-06-10 NOTE — Telephone Encounter (Signed)
Pt called with having some discomfort in his chest that lasted for about an hour. Also discomfort in his back. Has history of having a heart attack in the past which started out like this. He states not feeling good and has some difficulty breathing. Checked his blood sugar and it was 234 this morning. He is also c/o pain in his joints.  Advised him to go to the ED to be assessed and he has refused to go. He stated that he will call his cardiologist.  Will notify flow at [facility] at [facility].  Reason for Disposition . Chest pain lasts > 5 minutes (Exceptions: chest pain occurring > 3 days ago and now asymptomatic; same as previously diagnosed heartburn and has accompanying sour taste in mouth)  Answer Assessment - Initial Assessment Questions 1. LOCATION: "Where does it hurt?"       Discomfort in middle of chest 2. RADIATION: "Does the pain go anywhere else?" (e.g., into neck, jaw, arms, back)     Back, joints 3. ONSET: "When did the chest pain begin?" (Minutes, hours or days)      This morning 4. PATTERN "Does the pain come and go, or has it been constant since it started?"  "Does it get worse with exertion?"      Not constant 5. DURATION: "How long does it last" (e.g., seconds, minutes, hours)     An hour 6. SEVERITY: "How bad is the pain?"  (e.g., Scale 1-10; mild, moderate, or severe)    - MILD (1-3): doesn't interfere with normal activities     - MODERATE (4-7): interferes with normal activities or awakens from sleep    - SEVERE (8-10): excruciating pain, unable to do any normal activities       alarming 7. CARDIAC RISK FACTORS: "Do you have any history of heart problems or risk factors for heart disease?" (e.g., prior heart attack, angina; high blood pressure, diabetes, being overweight, high cholesterol, smoking, or strong family history of heart disease)     Heart attack, diabetes, high cholesterol, dad had stroke 8. PULMONARY RISK FACTORS: "Do you have any history of lung disease?"   (e.g., blood clots in lung, asthma, emphysema, birth control pills)     no 9. CAUSE: "What do you think is causing the chest pain?"     Not sure 10. OTHER SYMPTOMS: "Do you have any other symptoms?" (e.g., dizziness, nausea, vomiting, sweating, fever, difficulty breathing, cough)       Not feeling good, lightheaded, some difficulty breathing  Protocols used: CHEST PAIN-A-AH

## 2018-06-10 NOTE — ED Triage Notes (Signed)
Pt states he has been having CP/indegestion for approx a week, sharp pain when he lays down. Pain started going through to his back this morning. States he had a heart attack 4 years ago and it felt like this. Reports some SOB and dull headaches for a week as well.

## 2018-06-10 NOTE — Telephone Encounter (Signed)
Thanks.  I'll await the notes.

## 2018-06-10 NOTE — Progress Notes (Signed)
Received a call from the emergency department regarding [name] [name].  He said "indigestion" feelings in his chest that are somewhat reminiscent of his previous cardiac pain.  He is currently chest pain-free.  He has had 2 normal troponins and has an unremarkable EKG.  After review of his case, recommend an outpatient exercise Myoview stress test since the patient has known coronary artery disease.  Will arrange cardiology follow-up after his stress test is done. Will notify Dr Ellyn Hack.  Sherren Mocha 06/10/2018 3:06 PM

## 2018-06-10 NOTE — Discharge Instructions (Signed)
Start taking Zantac twice daily.  Avoid fried foods, fatty foods, spicy foods, and alcohol.  Avoid eating late at night.  The cardiology team will call you to help set up a stress test and follow-up with Dr. Ellyn Hack on an outpatient basis.  Please return to the emergency department immediately for any concerning signs or symptoms develop such as recurrence of chest pain, shortness of breath, breaking into cold sweat, persistent vomiting, fevers, or coughing up blood.

## 2018-06-10 NOTE — ED Provider Notes (Signed)
[facility] Provider Note   CSN: [id number] Arrival date & time: 06/10/18  1012     History   Chief Complaint Chief Complaint  Patient presents with  . Chest Pain    HPI Demarie Hyneman III is a 57 y.o. male with history of CAD, inferior STEMI with PCI to the RCA in 2015 currently on Brilinta, hypertension, hyperlipidemia, type 2 diabetes mellitus, peripheral neuropathy presents for evaluation of acute onset, intermittent chest pains for 1 week.  He states that for the past week he has been feeling "indigestion and reflux" with intermittent substernal burning.  He states that this typically worsens after meals.  This morning he woke up with a similar sensation at around 4:30 AM.  He states the symptoms did not improve with Alka-Seltzer like they normally do and he then developed substernal and left-sided chest pressure suddenly at around 5 AM.  This radiated to the back.  He states that this reminds him of the last time he had an MI.  He does note intermittent shortness of breath specifically with exertion as well as lightheadedness with deep breaths.  He denies syncope, diaphoresis, nausea, or vomiting.  No abdominal pain.  He tried Alka-Seltzer twice with very mild relief of his symptoms.  He states he was able to fall back asleep and when he awoke his symptoms had improved.  He then ate some yogurt with worsening symptoms. He denies any recent travel or surgeries, no hemoptysis, no prior history of DVT or PE, and he is not on any testosterone hormonal placement therapy.  He is a former smoker.  He is followed by Dr. Ellyn Hack with cardiology.  The history is provided by the patient.    Past Medical History:  Diagnosis Date  . Allergic rhinitis   . CAD S/P percutaneous coronary angioplasty 12/03/2013   PCI-RCA - Xience Alpine DES 3.0 mm x 18 mm --> 3.5 mm; Echo 1/'15" EF 45-50%, mild C LVH, mild Inf HK, Gr 1 DD  . Diabetes mellitus    type2  .  Fracture of right hand 1983  . Hyperlipidemia   . Hypertension   . Nocturia   . Paresthesia    tingling in feet  . STEMI (ST elevation myocardial infarction) of inf. wall 12/03/2013    Patient Active Problem List   Diagnosis Date Noted  . Paronychia of left thumb 05/14/2017  . Near syncope 09/07/2016  . Trapezius muscle spasm 02/27/2016  . Advance care planning 09/28/2014  . Fatigue 04/24/2014  . Essential hypertension 01/27/2014  . CAD S/P percutaneous coronary angioplasty - Promus Premier DES 3.0 mm x 18 mm (3.5 mm) to mid RCA occlusion; no significant left-sided disease. 12/03/2013    Class: Acute  . Presence of drug coated stent in right coronary artery: Promus Premier DES 3.0 mm x 18 mm (3.5 mm) to mid RCA occlusion 12/03/12 12/03/2013    Class: Diagnosis of  . Former light tobacco smoker 12/03/2013  . ST elevation myocardial infarction (STEMI) of inferior wall, subsequent episode of care ([place]) 12/03/2013  . Skin lesion 04/14/2013  . Rash and nonspecific skin eruption 02/16/2013  . Routine general medical examination at a health care facility 05/21/2012  . Obesity 01/28/2012  . PLANTAR FASCIITIS, LEFT 12/14/2008  . HYPERLIPIDEMIA, MIXED -- significant hypertriglyceridemia 06/28/2008  . GERD 06/28/2008  . OTHER URETHRITIS 06/17/2008  . Diabetes mellitus type 2 with neurological manifestations ([place]) 03/17/2005    Past Surgical History:  Procedure Laterality Date  .  KNEE SURGERY     B arthroscopic  . LEFT HEART CATHETERIZATION WITH CORONARY ANGIOGRAM N/A 12/03/2013   Procedure: LEFT HEART CATHETERIZATION WITH CORONARY ANGIOGRAM;  Surgeon: Leonie Man, MD;  Location: [facility] CATH LAB;  Service: Cardiovascular;  Laterality: N/A;  . NM MYOVIEW LTD  11/2015   Low risk stress test. Normal perfusion. Normal pump function. EF 50-55%  . PERCUTANEOUS CORONARY STENT INTERVENTION (PCI-S)  12/03/2013   mRCA - Xience Alpine DES 3.0 mm x 18 mm --> 3.5 mm  . SEPTOPLASTY  1970   due to Fx Nose    . TRANSTHORACIC ECHOCARDIOGRAM  12/03/2013; 11/2014   a. Mild Conc LVH; EF 45-50%, Mild Inferior HK. Grade 1 DD;; b. EF 55-60%. No RWMA. Moderate RV dilation but normal function.        Home Medications    Prior to Admission medications   Medication Sig Start Date End Date Taking? Authorizing Provider  atorvastatin (LIPITOR) 40 MG tablet Take 1 tablet (40 mg total) by mouth daily. 01/09/18   Leonie Man, MD  Continuous Blood Gluc Receiver (FREESTYLE LIBRE 14 DAY READER) DEVI 1 each by Continuous infusion (non-IV) route daily. Use to monitor blood glucose.  Diagnosis: E11.49  Insulin dependent. 05/18/18   Tonia Ghent, MD  fenofibrate 160 MG tablet Take 1 tablet (160 mg total) by mouth daily. 01/09/18   Leonie Man, MD  folic acid ([facility] FOLIC ACID) 381 MCG tablet Take 1 tablet (400 mcg total) by mouth daily. 09/28/14   Tonia Ghent, MD  glipiZIDE (GLUCOTROL) 5 MG tablet TAKE 1 TABLET BY MOUTH  DAILY BEFORE BREAKFAST 02/23/18   Tonia Ghent, MD  glucose blood [facility] VERIO) test strip Use as instructed to check blood sugar daily and as needed.  Diagnosis: E11.49  Non insulin dependent. 06/19/17   Tonia Ghent, MD  Insulin Glargine (BASAGLAR KWIKPEN) 100 UNIT/ML SOPN Inject 0.05-0.2 mLs (5-20 Units total) into the skin at bedtime. 02/02/18   Tonia Ghent, MD  Insulin Pen Needle 31G X 5 MM MISC Use daily with insulin pen 02/02/18   Tonia Ghent, MD  lisinopril (PRINIVIL,ZESTRIL) 2.5 MG tablet Take 1 tablet (2.5 mg total) by mouth daily. 01/09/18   Leonie Man, MD  metFORMIN (GLUCOPHAGE) 500 MG tablet TAKE 1 TABLET BY MOUTH  TWICE A DAY WITH MEALS 12/30/17   Tonia Ghent, MD  metoprolol tartrate (LOPRESSOR) 25 MG tablet Take 0.5 tablets (12.5 mg total) by mouth 2 (two) times daily. 01/09/18   Leonie Man, MD  NITROSTAT 0.4 MG SL tablet DISSOLVE 1 TABLET UNDER THE TONGUE FOR CHEST PAIN. MAY REPEAT EVERY 5MINUTES UP TO 3 DOSES. IF NO RELIEF, CALL 911** 11/15/16    Leonie Man, MD  OMEGA-3 KRILL OIL 300 MG CAPS Take 1 capsule by mouth daily.     [provider]  [facility] DELICA LANCETS 82X MISC Use as instructed to check blood sugar once daily or as needed.  Diagnosis:  E11.49  Non insulin dependent. 06/19/17   Tonia Ghent, MD  ticagrelor (BRILINTA) 90 MG TABS tablet Take 1 tablet (90 mg total) by mouth 2 (two) times daily. 03/30/18   Leonie Man, MD  vitamin B-12 (CYANOCOBALAMIN) 1000 MCG tablet Take 1 tablet (1,000 mcg total) by mouth daily. 09/28/14   Tonia Ghent, MD  vitamin B-6 (PYRIDOXINE) 25 MG tablet Take 1 tablet (25 mg total) by mouth daily. 09/28/14   Tonia Ghent, MD  Family History Family History  Problem Relation Age of Onset  . Cancer Mother        ovarian  . Hypertension Father   . Hyperlipidemia Father   . Diabetes Father        lost 60 pounds and off all meds  . Cancer Father        bladder- Kidney cancer  . Hearing loss Brother   . Heart disease Paternal Grandfather        MI  . Colon cancer Neg Hx   . Prostate cancer Neg Hx     Social History Social History   Tobacco Use  . Smoking status: Former Smoker    Packs/day: 0.50    Years: 20.00    Pack years: 10.00    Types: Cigarettes    Last attempt to quit: 12/03/2013    Years since quitting: 4.5  . Smokeless tobacco: Never Used  Substance Use Topics  . Alcohol use: Yes    Comment: occ on weekends  . Drug use: No     Allergies   Metformin and related   Review of Systems Review of Systems  Constitutional: Negative for chills and fever.  Respiratory: Positive for shortness of breath.   Cardiovascular: Positive for chest pain.  Gastrointestinal: Negative for abdominal pain, nausea and vomiting.  Neurological: Positive for light-headedness. Negative for syncope.  All other systems reviewed and are negative.    Physical Exam Updated Vital Signs BP 120/80   Pulse 69   Temp 98.3 F (36.8 C) (Oral)   Resp 19   Ht 5\' 10"   (1.778 m)   Wt 94.3 kg (208 lb)   SpO2 98%   BMI 29.84 kg/m   Physical Exam  Constitutional: He appears well-developed and well-nourished. No distress.  HENT:  Head: Normocephalic and atraumatic.  Eyes: Conjunctivae are normal. Right eye exhibits no discharge. Left eye exhibits no discharge.  Neck: Normal range of motion. Neck supple. No JVD present. No tracheal deviation present.  Cardiovascular: Normal rate and regular rhythm.  Pulses:      Carotid pulses are 2+ on the right side, and 2+ on the left side.      Radial pulses are 2+ on the right side, and 2+ on the left side.       Dorsalis pedis pulses are 2+ on the right side, and 2+ on the left side.       Posterior tibial pulses are 2+ on the right side, and 2+ on the left side.  Homans sign absent bilaterally, no lower extremity edema, no palpable cords, compartments are soft   Pulmonary/Chest: Effort normal and breath sounds normal. No accessory muscle usage. No respiratory distress.  No tenderness to palpation of the chest wall  Abdominal: Soft. Bowel sounds are normal. He exhibits no distension. There is no tenderness.  Musculoskeletal: He exhibits no edema.       Right lower leg: Normal. He exhibits no tenderness and no edema.       Left lower leg: Normal. He exhibits no tenderness and no edema.  Neurological: He is alert.  Skin: No erythema.  Psychiatric: He has a normal mood and affect. His behavior is normal.  Nursing note and vitals reviewed.    ED Treatments / Results  Labs (all labs ordered are listed, but only abnormal results are displayed) Labs Reviewed  BASIC METABOLIC PANEL - Abnormal; Notable for the following components:      Result Value   Glucose, Bld 220 (*)  Calcium 10.4 (*)    All other components within normal limits  CBC  I-STAT TROPONIN, ED  I-STAT TROPONIN, ED    EKG EKG Interpretation  Date/Time:  Wednesday June 10 2018 10:18:07 EDT Ventricular Rate:  74 PR Interval:  176 QRS  Duration: 92 QT Interval:  348 QTC Calculation: 386 R Axis:   4 Text Interpretation:  Normal sinus rhythm Possible Inferior infarct , age undetermined Abnormal ECG deep inverted t waves resolved since prior Otherwise no significant change Confirmed by Deno Etienne [id number]) on 06/10/2018 12:16:49 PM   Radiology Dg Chest 2 View  Result Date: 06/10/2018 CLINICAL DATA:  Short of breath, weakness, chest pain for a week EXAM: CHEST - 2 VIEW COMPARISON:  Portable chest x-ray of 12/03/2013 FINDINGS: No active infiltrate or effusion is seen. Mediastinal and hilar contours are unremarkable. The heart is within normal limits in size. No acute bony abnormality is seen. IMPRESSION: No active cardiopulmonary disease. Electronically Signed   By: Ivar Drape M.D.   On: 06/10/2018 10:36    Procedures Procedures (including critical care time)  Medications Ordered in ED Medications - No data to display   Initial Impression / Assessment and Plan / ED Course  I have reviewed the triage vital signs and the nursing notes.  Pertinent labs & imaging results that were available during my care of the patient were reviewed by me and considered in my medical decision making (see chart for details).    Patient with substernal chest pressure and feelings of indigestion and belching intermittently for 1 week.  Worsened last night into this morning after eating fried squash and steak for dinner.  He is afebrile, vital signs are stable.  He is nontoxic in appearance.  States this feels somewhat similar to the last time he had an MI.  He is currently asymptomatic.  EKG shows resolution of deep inverted T waves on last EKG, otherwise no significant changes.  No arrhythmia noted.  Chest x-ray shows no acute cardiopulmonary abnormalities.  Lab work shows hyperglycemia at around the patient's baseline, no significant metabolic derangements.  No leukocytosis or anemia.  Serial troponins are negative.  I highly doubt PE, Wells score  is 0.  No evidence of peritonitis, myocarditis, dissection, pleural effusion, or pneumonia.  On reevaluation, the patient is resting comfortably, he remains assymptomatic.  3:02 PM Spoke with Dr. Burt Knack with cardiology service.  He states that with benign lab work, negative serial troponins, EKG without acute ischemic changes, and with the patient currently a symptomatic he is stable for discharge home and will be set up for a stress test and follow-up with Dr. Ellyn Hack on an outpatient basis.  We will start the patient on Zantac twice daily.  Discussed management of GERD type symptoms.  Recommend follow-up with PCP and cardiology as scheduled.  Discussed strict ED return precautions.  Patient and patient's wife verbalized understanding of and agreement with plan and patient stable for discharge home at this time.  Patient was seen and evaluated by Dr. Tyrone Nine who agrees with assessment and plan at this time.  Final Clinical Impressions(s) / ED Diagnoses   Final diagnoses:  Atypical chest pain    ED Discharge Orders    None       Renita Papa, PA-C 06/10/18 [place], [place], DO 06/10/18 [id number]

## 2018-06-10 NOTE — Telephone Encounter (Signed)
I spoke with pt; pt not having any chest discomfort now. At 5 AM pt had chest discomfort and back pain that lasted 1 1/2 hours. Pt went back to sleep around 6:30 AM. Pt having joint pain and said his BS was 234 this morning. Pt is going to call cardiologist rather than going to ED. Advised pt needs to be evaluated and if cannot see card today needs to go to ED. Pt voiced understanding but will try to see card. FYI to Dr Damita Dunnings.

## 2018-06-16 ENCOUNTER — Other Ambulatory Visit: Payer: Self-pay | Admitting: Cardiology

## 2018-06-16 DIAGNOSIS — R079 Chest pain, unspecified: Secondary | ICD-10-CM

## 2018-06-18 ENCOUNTER — Other Ambulatory Visit: Payer: Self-pay | Admitting: Cardiology

## 2018-06-18 ENCOUNTER — Telehealth (HOSPITAL_COMMUNITY): Payer: Self-pay

## 2018-06-18 DIAGNOSIS — R0789 Other chest pain: Secondary | ICD-10-CM

## 2018-06-18 NOTE — Telephone Encounter (Signed)
Encounter complete. 

## 2018-06-23 ENCOUNTER — Ambulatory Visit (HOSPITAL_COMMUNITY)
Admission: RE | Admit: 2018-06-23 | Discharge: 2018-06-23 | Disposition: A | Discharge status: Home / Self Care | Payer: 59 | Source: Ambulatory Visit | Attending: Internal Medicine | Admitting: Internal Medicine

## 2018-06-23 DIAGNOSIS — R0789 Other chest pain: Secondary | ICD-10-CM | POA: Insufficient documentation

## 2018-06-23 LAB — MYOCARDIAL PERFUSION IMAGING
CHL CUP MPHR: 162 {beats}/min
CHL CUP NUCLEAR SDS: 1
CHL CUP RESTING HR STRESS: 72 {beats}/min
CHL RATE OF PERCEIVED EXERTION: 17
CSEPED: 8 min
CSEPHR: 95 %
Estimated workload: 10.1 METS
Exercise duration (sec): 45 s
LV dias vol: 106 mL (ref 62–150)
LV sys vol: 52 mL
Peak HR: 155 {beats}/min
SRS: 1
SSS: 2
TID: 1.05

## 2018-06-23 MED ORDER — TECHNETIUM TC 99M TETROFOSMIN IV KIT
10.6000 | PACK | Freq: Once | INTRAVENOUS | Status: AC | PRN
  Administered 2018-06-23: 10.6 via INTRAVENOUS
  Filled 2018-06-23: qty 11

## 2018-06-23 MED ORDER — TECHNETIUM TC 99M TETROFOSMIN IV KIT
31.8000 | PACK | Freq: Once | INTRAVENOUS | Status: AC | PRN
  Administered 2018-06-23: 31.8 via INTRAVENOUS
  Filled 2018-06-23: qty 32

## 2018-06-23 MED ORDER — TECHNETIUM TC 99M TETROFOSMIN IV KIT
10.2000 | PACK | Freq: Once | INTRAVENOUS | Status: DC | PRN
  Filled 2018-06-23: qty 11

## 2018-06-23 MED ORDER — TECHNETIUM TC 99M TETROFOSMIN IV KIT
32.4000 | PACK | Freq: Once | INTRAVENOUS | Status: DC | PRN
  Filled 2018-06-23: qty 33

## 2018-06-24 ENCOUNTER — Encounter (HOSPITAL_COMMUNITY): Payer: Self-pay | Admitting: *Deleted

## 2018-06-24 NOTE — Progress Notes (Signed)
Pt had an ETT Myoview study done on June 23, 2018 and this test was ordered by Daune Perch, NP and she is on vacation this week. This patient is a pt of Dr Ellyn Hack. Dr Ellyn Hack reviewed Myoview study and EKG's today and pt has a ROV with Dr [name], NP on 07/13/2018 to discuss results of Myoview study.

## 2018-07-02 ENCOUNTER — Ambulatory Visit (INDEPENDENT_AMBULATORY_CARE_PROVIDER_SITE_OTHER): Payer: 59 | Admitting: Cardiology

## 2018-07-02 ENCOUNTER — Encounter: Payer: Self-pay | Admitting: Cardiology

## 2018-07-02 VITALS — BP 130/77 | HR 78 | Ht 70.0 in | Wt 214.0 lb

## 2018-07-02 DIAGNOSIS — I208 Other forms of angina pectoris: Secondary | ICD-10-CM | POA: Diagnosis not present

## 2018-07-02 DIAGNOSIS — I10 Essential (primary) hypertension: Secondary | ICD-10-CM

## 2018-07-02 DIAGNOSIS — Z9861 Coronary angioplasty status: Secondary | ICD-10-CM | POA: Diagnosis not present

## 2018-07-02 DIAGNOSIS — R9439 Abnormal result of other cardiovascular function study: Secondary | ICD-10-CM

## 2018-07-02 DIAGNOSIS — I472 Ventricular tachycardia: Secondary | ICD-10-CM

## 2018-07-02 DIAGNOSIS — Z955 Presence of coronary angioplasty implant and graft: Secondary | ICD-10-CM

## 2018-07-02 DIAGNOSIS — I2510 Atherosclerotic heart disease of native coronary artery without angina pectoris: Secondary | ICD-10-CM

## 2018-07-02 DIAGNOSIS — E782 Mixed hyperlipidemia: Secondary | ICD-10-CM

## 2018-07-02 DIAGNOSIS — I4729 Other ventricular tachycardia: Secondary | ICD-10-CM

## 2018-07-02 MED ORDER — METOPROLOL TARTRATE 25 MG PO TABS: 25.0000 mg | ORAL_TABLET | Freq: Two times a day (BID) | ORAL | 3 refills | Status: DC

## 2018-07-02 NOTE — Assessment & Plan Note (Addendum)
With HIGH TG levels - difficult to assess LDL (direct LDL was high) -->  Now on combination of Fenofibrate & Arorvastatin. Need to consider Vascepa (can discuss in f/u post cath) -- Will need f/u FLP prior to post-cath f/u.  (NEVER followed with [facility] appointment)

## 2018-07-02 NOTE — Progress Notes (Signed)
PCP: Tonia Ghent, MD  Clinic Note: Chief Complaint  Patient presents with  . Hospitalization Follow-up    Post stress test  . Coronary Artery Disease    Chest pain evaluation none since ER visit.-     HPI: [name] [name] is a 57 y.o. male with a PMH below (CAD-PCI) who presents today for Hospital/ER follow-up to discuss stress test results.   CAD Hx: Inferior STEMI (January 2015) - 100% RCA --> DES PCI W/ Xience DES stent.  CRFs: HLD (high TG), HTN & DM-2.  He was last seen in February 2018: Doing well. No further CP. Also no further syncope/near syncope episodes.  Back to routine 6d/week exercise.  Recent Hospitalizations:   ER visit for atypical chest pain on June 10, 2018.  Ruled out for MI.  Discussed with on-call cardiologist and was felt to be okay to discharge home with outpatient stress test.  Studies Reviewed:   Myoview stress test June 23, 2018: EF 51%.  Findings consistent with prior MI with peri-infarct ischemia (LOW RISK).  Small defect of mild severity in the basal inferior mid inferior location.  There is also horizontal ST segment depression in inferior leads.  Although low risk images, the EKG changes are concerning.  There is concern for possible multivessel CAD.  Interval History: [name] [name] presents today being called in to discuss results of his stress test reviewed above.  Interestingly, prior to his ER visit earlier this month, he said about a month ago he had a spell where he just was not feeling right last several days where he has had no energy, felt extremely tired and fatigued.  Was more short of breath with just minimal exertion and has not atypical seeming almost heartburn-like symptoms.  The symptoms went away, but when he had the episode on 10 July, this made him think of the preliminary symptom that he felt at the time of his MI which was a "heartburn type symptoms quickly in the setting of his MI became profound dyspnea chest heaviness or  pressure.  This time around he did not have any of the dispenser check or chest pressure, just the heartburn type symptoms. He has not really been back to the same of exercise since the ER visit, but really not since weekend where he was not feeling well.  He has not had any further chest discomfort since the ER visit, but has not initially been exerting himself. Otherwise he denies any resting dyspnea.  No PND, orthopnea or edema.  No rapid rate heartbeats/palpitations.  No further syncope/near syncope,   Or TIA/amaurosis fugax symptoms. No bleeding issues with Brilinta. No claudication.  ROS: A comprehensive was performed. Review of Systems  Constitutional: Positive for malaise/fatigue (See HPI).  HENT: Negative for congestion (Allergy related), nosebleeds and sinus pain (Allergy related).   Respiratory: Positive for shortness of breath. Negative for cough (From allergies) and wheezing.   Cardiovascular:       See HPI  Gastrointestinal: Negative for abdominal pain, blood in stool, constipation, heartburn and melena.  Genitourinary: Negative for frequency and hematuria.  Musculoskeletal: Positive for joint pain.  Skin: Negative.   Neurological: Positive for dizziness. Negative for focal weakness and weakness.       No longer having near syncopal episodes  Endo/Heme/Allergies: Positive for environmental allergies. Does not bruise/bleed easily.  Psychiatric/Behavioral: Negative for depression and memory loss. The patient is not nervous/anxious and does not have insomnia.   All other systems reviewed and are  negative.   Past Medical History:  Diagnosis Date  . Allergic rhinitis   . CAD S/P percutaneous coronary angioplasty 12/03/2013   PCI-RCA - Xience Alpine DES 3.0 mm x 18 mm --> 3.5 mm; Echo 1/'15" EF 45-50%, mild C LVH, mild Inf HK, Gr 1 DD  . Diabetes mellitus    type2  . Fracture of right hand 1983  . Hyperlipidemia   . Hypertension   . Nocturia   . Paresthesia    tingling in  feet  . STEMI (ST elevation myocardial infarction) of inf. wall 12/03/2013    Past Surgical History:  Procedure Laterality Date  . KNEE SURGERY     B arthroscopic  . LEFT HEART CATHETERIZATION WITH CORONARY ANGIOGRAM N/A 12/03/2013   Procedure: LEFT HEART CATHETERIZATION WITH CORONARY ANGIOGRAM;  Surgeon: Leonie Man, MD;  Location: [facility] CATH LAB;  Service: Cardiovascular;  Laterality: N/A;  . NM MYOVIEW LTD  11/2015; 06/2018   a) Normal; b)  EF 51%.  Findings consistent with prior MI with peri-infarct ischemia (LOW RISK).  Small defect of mild severity in the basal inferior mid inferior location.  There is also horizontal ST segment depression in inferior leads.  Although low risk images, the EKG changes are concerning.  There is concern for possible multivessel CAD.  Marland Kitchen PERCUTANEOUS CORONARY STENT INTERVENTION (PCI-S)  12/03/2013   mRCA - Xience Alpine DES 3.0 mm x 18 mm --> 3.5 mm  . SEPTOPLASTY  1970   due to Fx Nose  . TRANSTHORACIC ECHOCARDIOGRAM  12/03/2013; 11/2014   a. Mild Conc LVH; EF 45-50%, Mild Inferior HK. Grade 1 DD;; b. EF 55-60%. No RWMA. Moderate RV dilation but normal function.    Current Meds  Medication Sig  . atorvastatin (LIPITOR) 40 MG tablet Take 1 tablet (40 mg total) by mouth daily.  . Continuous Blood Gluc Receiver (FREESTYLE LIBRE 14 DAY READER) DEVI 1 each by Continuous infusion (non-IV) route daily. Use to monitor blood glucose.  Diagnosis: E11.49  Insulin dependent.  . fenofibrate 160 MG tablet Take 1 tablet (160 mg total) by mouth daily.  . folic acid ([employer] FOLIC ACID) 412 MCG tablet Take 1 tablet (400 mcg total) by mouth daily.  Marland Kitchen glipiZIDE (GLUCOTROL) 5 MG tablet TAKE 1 TABLET BY MOUTH  DAILY BEFORE BREAKFAST  . glucose blood (ONETOUCH VERIO) test strip Use as instructed to check blood sugar daily and as needed.  Diagnosis: E11.49  Non insulin dependent.  . Insulin Glargine (BASAGLAR KWIKPEN) 100 UNIT/ML SOPN Inject 0.05-0.2 mLs (5-20 Units total) into the skin  at bedtime.  . Insulin Pen Needle 31G X 5 MM MISC Use daily with insulin pen  . lisinopril (PRINIVIL,ZESTRIL) 2.5 MG tablet Take 1 tablet (2.5 mg total) by mouth daily.  . metFORMIN (GLUCOPHAGE) 500 MG tablet TAKE 1 TABLET BY MOUTH  TWICE A DAY WITH MEALS  . metoprolol tartrate (LOPRESSOR) 25 MG tablet Take 1 tablet (25 mg total) by mouth 2 (two) times daily.  Marland Kitchen NITROSTAT 0.4 MG SL tablet DISSOLVE 1 TABLET UNDER THE TONGUE FOR CHEST PAIN. MAY REPEAT EVERY 5MINUTES UP TO 3 DOSES. IF NO RELIEF, CALL 911**  . OMEGA-3 KRILL OIL 300 MG CAPS Take 1 capsule by mouth daily.   Glory Rosebush DELICA LANCETS 87O MISC Use as instructed to check blood sugar once daily or as needed.  Diagnosis:  E11.49  Non insulin dependent.  . ranitidine (ZANTAC) 150 MG tablet Take 1 tablet (150 mg total) by mouth 2 (two) times  daily.  . ticagrelor (BRILINTA) 90 MG TABS tablet Take 1 tablet (90 mg total) by mouth 2 (two) times daily.  . vitamin B-12 (CYANOCOBALAMIN) 1000 MCG tablet Take 1 tablet (1,000 mcg total) by mouth daily.  . vitamin B-6 (PYRIDOXINE) 25 MG tablet Take 1 tablet (25 mg total) by mouth daily.  . [DISCONTINUED] metoprolol tartrate (LOPRESSOR) 25 MG tablet Take 0.5 tablets (12.5 mg total) by mouth 2 (two) times daily.    Allergies  Allergen Reactions  . Metformin And Related     Intolerant of more than 1000mg  a day   Social History   Tobacco Use  . Smoking status: Former Smoker    Packs/day: 0.50    Years: 20.00    Pack years: 10.00    Types: Cigarettes    Last attempt to quit: 12/03/2013    Years since quitting: 4.5  . Smokeless tobacco: Never Used  Substance Use Topics  . Alcohol use: Yes    Comment: occ on weekends  . Drug use: No   Social History   Social History Narrative   Married 1988 and lives with wife   One son   Works at Museum/gallery exhibitions officer, Librarian, academic.   Forward 10 pack year smoker, who quit on the day of his MI.     family history includes Cancer in his father and  mother; Diabetes in his father; Hearing loss in his brother; Heart disease in his paternal grandfather; Hyperlipidemia in his father; Hypertension in his father.  Wt Readings from Last 3 Encounters:  07/02/18 214 lb (97.1 kg)  06/23/18 208 lb (94.3 kg)  06/10/18 208 lb (94.3 kg)    PHYSICAL EXAM BP 130/77   Pulse 78   Ht 5\' 10"  (1.778 m)   Wt 214 lb (97.1 kg)   BMI 30.71 kg/m   Physical Exam  Constitutional: He is oriented to person, place, and time. He appears well-developed and well-nourished. No distress.  Borderline obese.  Otherwise healthy-appearing.  Well-groomed  HENT:  Head: Normocephalic and atraumatic.  Eyes: Pupils are equal, round, and reactive to light. Conjunctivae and EOM are normal.  Neck: Normal range of motion. Neck supple. No hepatojugular reflux and no JVD present. Carotid bruit is not present.  Cardiovascular: Normal rate, regular rhythm, normal heart sounds and normal pulses.  No extrasystoles are present. PMI is not displaced. Exam reveals no gallop.  No murmur heard. Pulmonary/Chest: Effort normal and breath sounds normal. No respiratory distress. He has no wheezes. He has no rales. He exhibits no tenderness.  Abdominal: Soft. Bowel sounds are normal. He exhibits no distension. There is no tenderness.  Musculoskeletal: Normal range of motion. He exhibits no edema.  Neurological: He is alert and oriented to person, place, and time.  Skin: Skin is warm and dry.  Psychiatric: He has a normal mood and affect. His behavior is normal. Judgment and thought content normal.  Nursing note and vitals reviewed.    Adult ECG Report  Rate: 86;  Rhythm: normal sinus rhythm and Inferior MI, age undetermined. Otherwise normal axis, intervals and durations.;   Narrative Interpretation: Stable EKG   Other studies Reviewed: Additional studies/ records that were reviewed today include:  Recent Labs:   Lab Results  Component Value Date   CHOL 212 (H) 01/22/2018   HDL 30  (L) 01/22/2018   LDLCALC Comment 01/22/2018   LDLDIRECT 91.0 07/24/2017   TRIG 642 (HH) 01/22/2018   CHOLHDL 7.1 (H) 01/22/2018    ASSESSMENT / PLAN:  Problem  List Items Addressed This Visit    Abnormal nuclear stress test - Primary    The stress test is contradictory and that affected the images look pretty similar to 2016 and are negative for ischemia besides may be some mild basal defect it is pretty fixed with may be minimal peri-infarct ischemia. The most concerning feature however is the frequent PVCs with short runs of NSVT near the end of exercise and the fact that he has burning sensation in his chest with walking that he initially thought was related to dyspnea.  Plan: Definitive evaluation with LEFT HEART CATHETERIZATION with CORONARY ANGIOGRAPHY and possible PERCUTANEOUS CORONARY INTERVENTION --> will schedule for the second week of August as he will be out of town.  The patient was well in agreement to proceeding based on his preceding symptoms and is also a stress test.      Relevant Orders   Basic metabolic panel   CBC   Atypical angina ([place])    This year visit was worsening very atypical sounding symptoms for angina, but did seem to be similar to the preliminary symptoms prior to his MI.  The fact that he had similar symptoms on the treadmill during his stress test as well as shortly before his ER visit with a prolonged weekend of feeling poorly, I am concerned that there may have been a new cardiac event.  Plan: Cardiac cath as scheduled.   Will increase beta-blocker dose. -       Relevant Medications   metoprolol tartrate (LOPRESSOR) 25 MG tablet   Other Relevant Orders   Basic metabolic panel   CBC   CAD S/P percutaneous coronary angioplasty - Promus Premier DES 3.0 mm x 18 mm (3.5 mm) to mid RCA occlusion; no significant left-sided disease. (Chronic)    History of inferior STEMI with RCA PCI.  Had been doing well since his STEMI.  Had a negative Myoview shortly  thereafter. Interestingly, his ER visit for chest pain seemed to be more related to atypical type symptoms, but with his history he did have a stress test that is now somewhat concerning.  Almost more concerning than the ER visit was that weekend of fatigue and exertional dyspnea leading up to that ER visit.  --With nonsustained VT on stress test, we need to exclude ischemia. Plan: Cardiac catheterization.       Relevant Medications   metoprolol tartrate (LOPRESSOR) 25 MG tablet   Other Relevant Orders   Basic metabolic panel   CBC   Essential hypertension (Chronic)    Well-controlled blood pressure on current dose of metoprolol and lisinopril. With the nonsustained VT during stress test however I will increase to 25 twice daily metoprolol.      Relevant Medications   metoprolol tartrate (LOPRESSOR) 25 MG tablet   HYPERLIPIDEMIA, MIXED -- significant hypertriglyceridemia (Chronic)    With HIGH TG levels - difficult to assess LDL (direct LDL was high) -->  Now on combination of Fenofibrate & Arorvastatin. Need to consider Vascepa (can discuss in f/u post cath) -- Will need f/u FLP prior to post-cath f/u.  (NEVER followed with CVRR appointment)      Relevant Medications   metoprolol tartrate (LOPRESSOR) 25 MG tablet   NSVT (nonsustained ventricular tachycardia) ([facility]CC) - on TM Myoview    The most concerning feature treadmill stress test was frequent PVCs and short runs of nonsustained VT during exercise.  Images were reassuring, but this is concerning.  This in conjunction with his general poor feeling about  a week or so before his ER visit is concerning for recurrent ischemic event. Plan: Cardiac catheterization.      Relevant Medications   metoprolol tartrate (LOPRESSOR) 25 MG tablet   Other Relevant Orders   Basic metabolic panel   CBC   Presence of drug coated stent in right coronary artery: Promus Premier DES 3.0 mm x 18 mm (3.5 mm) to mid RCA occlusion 12/03/12 (Chronic)     He is still on Brilinta along with aspirin.  Pending the results of his heart catheterization, if the stent is patent, would probably reduce dose to 60 mg for maintenance. He is on beta-blocker and ACE inhibitor. On statin.        Performing MD:  Daneen Schick [name], M.D.  Procedure:  LEFT HEART CATHETERIZATION with CORONARY ANGIOGRAPHY and possible PERCUTANEOUS CORONARY INTERVENTION   The procedure with Risks/Benefits/Alternatives and Indications was reviewed with the patient AND his wife.  All questions were answered.    Risks / Complications include, but not limited to: Death, MI, CVA/TIA, VF/VT (with defibrillation), Bradycardia (need for temporary pacer placement), contrast induced nephropathy, bleeding / bruising / hematoma / pseudoaneurysm, vascular or coronary injury (with possible emergent CT or Vascular Surgery), adverse medication reactions, infection.  Additional risks involving the use of radiation with the possibility of radiation burns and cancer were explained in detail.  The patient (and family) voice understanding and agree to proceed.      Current medicines are reviewed at length with the patient today. (+/- concerns) n/a The following changes have been made: -- see below  Patient Instructions   You are scheduled for a Cardiac Catheterization on Thursday, August 15 with Dr. Daneen Schick.  DO NOT TAKE GLIPIZIDE THE MORNING OF THE PROCEDURE  TAKE 1/2 THE DOSE OF THE EVENING INSULIN Wednesday NIGHT  DO NOT TAKE METFORMIN Thursday, Friday OR Saturday=RESTART SUNDAY  INCREASE METOPROLOL TO 25 MG TWICE DAILY-1 OF THE 25 MG TABLETS TWICE DAILY   Studies Ordered:   Orders Placed This Encounter  Procedures  . Basic metabolic panel  . CBC      Glenetta Hew, M.D., M.S. Interventional Cardiologist   Pager # [phone] Phone # [phone] [address] [place], [place] [address]

## 2018-07-02 NOTE — Assessment & Plan Note (Addendum)
History of inferior STEMI with RCA PCI.  Had been doing well since his STEMI.  Had a negative Myoview shortly thereafter. Interestingly, his ER visit for chest pain seemed to be more related to atypical type symptoms, but with his history he did have a stress test that is now somewhat concerning.  Almost more concerning than the ER visit was that weekend of fatigue and exertional dyspnea leading up to that ER visit.  --With nonsustained VT on stress test, we need to exclude ischemia. Plan: Cardiac catheterization.

## 2018-07-02 NOTE — Assessment & Plan Note (Signed)
Well-controlled blood pressure on current dose of metoprolol and lisinopril. With the nonsustained VT during stress test however I will increase to 25 twice daily metoprolol.

## 2018-07-02 NOTE — H&P (View-Only) (Signed)
PCP: Tonia Ghent, MD  Clinic Note: Chief Complaint  Patient presents with  . Hospitalization Follow-up    Post stress test  . Coronary Artery Disease    Chest pain evaluation none since ER visit.-     HPI: [name] [name] is a 57 y.o. male with a PMH below (CAD-PCI) who presents today for Hospital/ER follow-up to discuss stress test results.   CAD Hx: Inferior STEMI (January 2015) - 100% RCA --> DES PCI W/ Xience DES stent.  CRFs: HLD (high TG), HTN & DM-2.  He was last seen in February 2018: Doing well. No further CP. Also no further syncope/near syncope episodes.  Back to routine 6d/week exercise.  Recent Hospitalizations:   ER visit for atypical chest pain on June 10, 2018.  Ruled out for MI.  Discussed with on-call cardiologist and was felt to be okay to discharge home with outpatient stress test.  Studies Reviewed:   Myoview stress test June 23, 2018: EF 51%.  Findings consistent with prior MI with peri-infarct ischemia (LOW RISK).  Small defect of mild severity in the basal inferior mid inferior location.  There is also horizontal ST segment depression in inferior leads.  Although low risk images, the EKG changes are concerning.  There is concern for possible multivessel CAD.  Interval History: [name] presents today being called in to discuss results of his stress test reviewed above.  Interestingly, prior to his ER visit earlier this month, he said about a month ago he had a spell where he just was not feeling right last several days where he has had no energy, felt extremely tired and fatigued.  Was more short of breath with just minimal exertion and has not atypical seeming almost heartburn-like symptoms.  The symptoms went away, but when he had the episode on 10 July, this made him think of the preliminary symptom that he felt at the time of his MI which was a "heartburn type symptoms quickly in the setting of his MI became profound dyspnea chest heaviness or  pressure.  This time around he did not have any of the dispenser check or chest pressure, just the heartburn type symptoms. He has not really been back to the same of exercise since the ER visit, but really not since weekend where he was not feeling well.  He has not had any further chest discomfort since the ER visit, but has not initially been exerting himself. Otherwise he denies any resting dyspnea.  No PND, orthopnea or edema.  No rapid rate heartbeats/palpitations.  No further syncope/near syncope,   Or TIA/amaurosis fugax symptoms. No bleeding issues with Brilinta. No claudication.  ROS: A comprehensive was performed. Review of Systems  Constitutional: Positive for malaise/fatigue (See HPI).  HENT: Negative for congestion (Allergy related), nosebleeds and sinus pain (Allergy related).   Respiratory: Positive for shortness of breath. Negative for cough (From allergies) and wheezing.   Cardiovascular:       See HPI  Gastrointestinal: Negative for abdominal pain, blood in stool, constipation, heartburn and melena.  Genitourinary: Negative for frequency and hematuria.  Musculoskeletal: Positive for joint pain.  Skin: Negative.   Neurological: Positive for dizziness. Negative for focal weakness and weakness.       No longer having near syncopal episodes  Endo/Heme/Allergies: Positive for environmental allergies. Does not bruise/bleed easily.  Psychiatric/Behavioral: Negative for depression and memory loss. The patient is not nervous/anxious and does not have insomnia.   All other systems reviewed and are  negative.   Past Medical History:  Diagnosis Date  . Allergic rhinitis   . CAD S/P percutaneous coronary angioplasty 12/03/2013   PCI-RCA - Xience Alpine DES 3.0 mm x 18 mm --> 3.5 mm; Echo 1/'15" EF 45-50%, mild C LVH, mild Inf HK, Gr 1 DD  . Diabetes mellitus    type2  . Fracture of right hand 1983  . Hyperlipidemia   . Hypertension   . Nocturia   . Paresthesia    tingling in  feet  . STEMI (ST elevation myocardial infarction) of inf. wall 12/03/2013    Past Surgical History:  Procedure Laterality Date  . KNEE SURGERY     B arthroscopic  . LEFT HEART CATHETERIZATION WITH CORONARY ANGIOGRAM N/A 12/03/2013   Procedure: LEFT HEART CATHETERIZATION WITH CORONARY ANGIOGRAM;  Surgeon: Leonie Man, MD;  Location[facility] CATH LAB;  Service: Cardiovascular;  Laterality: N/A;  . NM MYOVIEW LTD  11/2015; 06/2018   a) Normal; b)  EF 51%.  Findings consistent with prior MI with peri-infarct ischemia (LOW RISK).  Small defect of mild severity in the basal inferior mid inferior location.  There is also horizontal ST segment depression in inferior leads.  Although low risk images, the EKG changes are concerning.  There is concern for possible multivessel CAD.  Marland Kitchen PERCUTANEOUS CORONARY STENT INTERVENTION (PCI-S)  12/03/2013   mRCA - Xience Alpine DES 3.0 mm x 18 mm --> 3.5 mm  . SEPTOPLASTY  1970   due to Fx Nose  . TRANSTHORACIC ECHOCARDIOGRAM  12/03/2013; 11/2014   a. Mild Conc LVH; EF 45-50%, Mild Inferior HK. Grade 1 DD;; b. EF 55-60%. No RWMA. Moderate RV dilation but normal function.    Current Meds  Medication Sig  . atorvastatin (LIPITOR) 40 MG tablet Take 1 tablet (40 mg total) by mouth daily.  . Continuous Blood Gluc Receiver (FREESTYLE LIBRE 14 DAY READER) DEVI 1 each by Continuous infusion (non-IV) route daily. Use to monitor blood glucose.  Diagnosis: E11.49  Insulin dependent.  . fenofibrate 160 MG tablet Take 1 tablet (160 mg total) by mouth daily.  . folic acid ([employer]S FOLIC ACID) 426 MCG tablet Take 1 tablet (400 mcg total) by mouth daily.  Marland Kitchen glipiZIDE (GLUCOTROL) 5 MG tablet TAKE 1 TABLET BY MOUTH  DAILY BEFORE BREAKFAST  . glucose blood (ONETOUCH VERIO) test strip Use as instructed to check blood sugar daily and as needed.  Diagnosis: E11.49  Non insulin dependent.  . Insulin Glargine (BASAGLAR KWIKPEN) 100 UNIT/ML SOPN Inject 0.05-0.2 mLs (5-20 Units total) into the skin  at bedtime.  . Insulin Pen Needle 31G X 5 MM MISC Use daily with insulin pen  . lisinopril (PRINIVIL,ZESTRIL) 2.5 MG tablet Take 1 tablet (2.5 mg total) by mouth daily.  . metFORMIN (GLUCOPHAGE) 500 MG tablet TAKE 1 TABLET BY MOUTH  TWICE A DAY WITH MEALS  . metoprolol tartrate (LOPRESSOR) 25 MG tablet Take 1 tablet (25 mg total) by mouth 2 (two) times daily.  Marland Kitchen NITROSTAT 0.4 MG SL tablet DISSOLVE 1 TABLET UNDER THE TONGUE FOR CHEST PAIN. MAY REPEAT EVERY 5MINUTES UP TO 3 DOSES. IF NO RELIEF, CALL 911**  . OMEGA-3 KRILL OIL 300 MG CAPS Take 1 capsule by mouth daily.   Glory Rosebush DELICA LANCETS 83M MISC Use as instructed to check blood sugar once daily or as needed.  Diagnosis:  E11.49  Non insulin dependent.  . ranitidine (ZANTAC) 150 MG tablet Take 1 tablet (150 mg total) by mouth 2 (two) times  daily.  . ticagrelor (BRILINTA) 90 MG TABS tablet Take 1 tablet (90 mg total) by mouth 2 (two) times daily.  . vitamin B-12 (CYANOCOBALAMIN) 1000 MCG tablet Take 1 tablet (1,000 mcg total) by mouth daily.  . vitamin B-6 (PYRIDOXINE) 25 MG tablet Take 1 tablet (25 mg total) by mouth daily.  . [DISCONTINUED] metoprolol tartrate (LOPRESSOR) 25 MG tablet Take 0.5 tablets (12.5 mg total) by mouth 2 (two) times daily.    Allergies  Allergen Reactions  . Metformin And Related     Intolerant of more than 1000mg  a day   Social History   Tobacco Use  . Smoking status: Former Smoker    Packs/day: 0.50    Years: 20.00    Pack years: 10.00    Types: Cigarettes    Last attempt to quit: 12/03/2013    Years since quitting: 4.5  . Smokeless tobacco: Never Used  Substance Use Topics  . Alcohol use: Yes    Comment: occ on weekends  . Drug use: No   Social History   Social History Narrative   Married 1988 and lives with wife   One son   Works at Museum/gallery exhibitions officer, Librarian, academic.   Forward 10 pack year smoker, who quit on the day of his MI.     family history includes Cancer in his father and  mother; Diabetes in his father; Hearing loss in his brother; Heart disease in his paternal grandfather; Hyperlipidemia in his father; Hypertension in his father.  Wt Readings from Last 3 Encounters:  07/02/18 214 lb (97.1 kg)  06/23/18 208 lb (94.3 kg)  06/10/18 208 lb (94.3 kg)    PHYSICAL EXAM BP 130/77   Pulse 78   Ht 5\' 10"  (1.778 m)   Wt 214 lb (97.1 kg)   BMI 30.71 kg/m   Physical Exam  Constitutional: He is oriented to person, place, and time. He appears well-developed and well-nourished. No distress.  Borderline obese.  Otherwise healthy-appearing.  Well-groomed  HENT:  Head: Normocephalic and atraumatic.  Eyes: Pupils are equal, round, and reactive to light. Conjunctivae and EOM are normal.  Neck: Normal range of motion. Neck supple. No hepatojugular reflux and no JVD present. Carotid bruit is not present.  Cardiovascular: Normal rate, regular rhythm, normal heart sounds and normal pulses.  No extrasystoles are present. PMI is not displaced. Exam reveals no gallop.  No murmur heard. Pulmonary/Chest: Effort normal and breath sounds normal. No respiratory distress. He has no wheezes. He has no rales. He exhibits no tenderness.  Abdominal: Soft. Bowel sounds are normal. He exhibits no distension. There is no tenderness.  Musculoskeletal: Normal range of motion. He exhibits no edema.  Neurological: He is alert and oriented to person, place, and time.  Skin: Skin is warm and dry.  Psychiatric: He has a normal mood and affect. His behavior is normal. Judgment and thought content normal.  Nursing note and vitals reviewed.    Adult ECG Report  Rate: 86;  Rhythm: normal sinus rhythm and Inferior MI, age undetermined. Otherwise normal axis, intervals and durations.;   Narrative Interpretation: Stable EKG   Other studies Reviewed: Additional studies/ records that were reviewed today include:  Recent Labs:   Lab Results  Component Value Date   CHOL 212 (H) 01/22/2018   HDL 30  (L) 01/22/2018   LDLCALC Comment 01/22/2018   LDLDIRECT 91.0 07/24/2017   TRIG 642 (HH) 01/22/2018   CHOLHDL 7.1 (H) 01/22/2018    ASSESSMENT / PLAN:  Problem  List Items Addressed This Visit    Abnormal nuclear stress test - Primary    The stress test is contradictory and that affected the images look pretty similar to 2016 and are negative for ischemia besides may be some mild basal defect it is pretty fixed with may be minimal peri-infarct ischemia. The most concerning feature however is the frequent PVCs with short runs of NSVT near the end of exercise and the fact that he has burning sensation in his chest with walking that he initially thought was related to dyspnea.  Plan: Definitive evaluation with LEFT HEART CATHETERIZATION with CORONARY ANGIOGRAPHY and possible PERCUTANEOUS CORONARY INTERVENTION --> will schedule for the second week of August as he will be out of town.  The patient was well in agreement to proceeding based on his preceding symptoms and is also a stress test.      Relevant Orders   Basic metabolic panel   CBC   Atypical angina ([place])    This year visit was worsening very atypical sounding symptoms for angina, but did seem to be similar to the preliminary symptoms prior to his MI.  The fact that he had similar symptoms on the treadmill during his stress test as well as shortly before his ER visit with a prolonged weekend of feeling poorly, I am concerned that there may have been a new cardiac event.  Plan: Cardiac cath as scheduled.   Will increase beta-blocker dose. -       Relevant Medications   metoprolol tartrate (LOPRESSOR) 25 MG tablet   Other Relevant Orders   Basic metabolic panel   CBC   CAD S/P percutaneous coronary angioplasty - Promus Premier DES 3.0 mm x 18 mm (3.5 mm) to mid RCA occlusion; no significant left-sided disease. (Chronic)    History of inferior STEMI with RCA PCI.  Had been doing well since his STEMI.  Had a negative Myoview shortly  thereafter. Interestingly, his ER visit for chest pain seemed to be more related to atypical type symptoms, but with his history he did have a stress test that is now somewhat concerning.  Almost more concerning than the ER visit was that weekend of fatigue and exertional dyspnea leading up to that ER visit.  --With nonsustained VT on stress test, we need to exclude ischemia. Plan: Cardiac catheterization.       Relevant Medications   metoprolol tartrate (LOPRESSOR) 25 MG tablet   Other Relevant Orders   Basic metabolic panel   CBC   Essential hypertension (Chronic)    Well-controlled blood pressure on current dose of metoprolol and lisinopril. With the nonsustained VT during stress test however I will increase to 25 twice daily metoprolol.      Relevant Medications   metoprolol tartrate (LOPRESSOR) 25 MG tablet   HYPERLIPIDEMIA, MIXED -- significant hypertriglyceridemia (Chronic)    With HIGH TG levels - difficult to assess LDL (direct LDL was high) -->  Now on combination of Fenofibrate & Arorvastatin. Need to consider Vascepa (can discuss in f/u post cath) -- Will need f/u FLP prior to post-cath f/u.  (NEVER followed with CVRR appointment)      Relevant Medications   metoprolol tartrate (LOPRESSOR) 25 MG tablet   NSVT (nonsustained ventricular tachycardia) (HCC) - on TM Myoview    The most concerning feature treadmill stress test was frequent PVCs and short runs of nonsustained VT during exercise.  Images were reassuring, but this is concerning.  This in conjunction with his general poor feeling about  a week or so before his ER visit is concerning for recurrent ischemic event. Plan: Cardiac catheterization.      Relevant Medications   metoprolol tartrate (LOPRESSOR) 25 MG tablet   Other Relevant Orders   Basic metabolic panel   CBC   Presence of drug coated stent in right coronary artery: Promus Premier DES 3.0 mm x 18 mm (3.5 mm) to mid RCA occlusion 12/03/12 (Chronic)     He is still on Brilinta along with aspirin.  Pending the results of his heart catheterization, if the stent is patent, would probably reduce dose to 60 mg for maintenance. He is on beta-blocker and ACE inhibitor. On statin.        Performing MD:  Daneen Schick [name], M.D.  Procedure:  LEFT HEART CATHETERIZATION with CORONARY ANGIOGRAPHY and possible PERCUTANEOUS CORONARY INTERVENTION   The procedure with Risks/Benefits/Alternatives and Indications was reviewed with the patient AND his wife.  All questions were answered.    Risks / Complications include, but not limited to: Death, MI, CVA/TIA, VF/VT (with defibrillation), Bradycardia (need for temporary pacer placement), contrast induced nephropathy, bleeding / bruising / hematoma / pseudoaneurysm, vascular or coronary injury (with possible emergent CT or Vascular Surgery), adverse medication reactions, infection.  Additional risks involving the use of radiation with the possibility of radiation burns and cancer were explained in detail.  The patient (and family) voice understanding and agree to proceed.      Current medicines are reviewed at length with the patient today. (+/- concerns) n/a The following changes have been made: -- see below  Patient Instructions   You are scheduled for a Cardiac Catheterization on Thursday, August 15 with Dr. Daneen Schick.  DO NOT TAKE GLIPIZIDE THE MORNING OF THE PROCEDURE  TAKE 1/2 THE DOSE OF THE EVENING INSULIN Wednesday NIGHT  DO NOT TAKE METFORMIN Thursday, Friday OR Saturday=RESTART SUNDAY  INCREASE METOPROLOL TO 25 MG TWICE DAILY-1 OF THE 25 MG TABLETS TWICE DAILY   Studies Ordered:   Orders Placed This Encounter  Procedures  . Basic metabolic panel  . CBC      Glenetta Hew, M.D., M.S. Interventional Cardiologist   Pager # [phone] Phone # [phone] [address] [place], [place] [address]

## 2018-07-02 NOTE — Patient Instructions (Addendum)
    [facility] [address] [place] [address] Dept: [phone] Loc: [facility]  07/02/2018  You are scheduled for a Cardiac Catheterization on Thursday, August 15 with Dr. Daneen Schick.  1. Please arrive at the [facility] (Main Entrance A) at [facility]: [address] [place], [place] [address] at 5:30 AM (This time is two hours before your procedure to ensure your preparation). Free valet parking service is available.   Special note: Every effort is made to have your procedure done on time. Please understand that emergencies sometimes delay scheduled procedures.  2. Diet: DO NOT EAT OR DRINK ANYTHING AFTER MIDNIGHT  3. Labs: Your physician recommends that you HAVE LAB WORK TODAY  4. Medication instructions in preparation for your procedure:  DO NOT TAKE GLIPIZIDE THE MORNING OF THE PROCEDURE  TAKE 1/2 THE DOSE OF THE EVENING INSULIN Wednesday NIGHT  DO NOT TAKE METFORMIN Thursday, Friday OR Saturday=RESTART SUNDAY  On the morning of your procedure, take your Brilinta/Ticagrelor and any morning medicines NOT listed above.  You may use sips of water.  5. Plan for one night stay--bring personal belongings. 6. Bring a current list of your medications and current insurance cards. 7. You MUST have a responsible person to drive you home. 8. Someone MUST be with you the first 24 hours after you arrive home or your discharge will be delayed. 9. Please wear clothes that are easy to get on and off and wear slip-on shoes.  Thank you for allowing [place] to care for you!   -- [facility]   INCREASE METOPROLOL TO 25 MG TWICE DAILY-1 OF THE 25 MG TABLETS TWICE DAILY

## 2018-07-03 DIAGNOSIS — R9439 Abnormal result of other cardiovascular function study: Secondary | ICD-10-CM | POA: Insufficient documentation

## 2018-07-03 DIAGNOSIS — I472 Ventricular tachycardia: Secondary | ICD-10-CM | POA: Insufficient documentation

## 2018-07-03 DIAGNOSIS — I4729 Other ventricular tachycardia: Secondary | ICD-10-CM | POA: Insufficient documentation

## 2018-07-03 LAB — CBC
HEMATOCRIT: 39.2 % (ref 37.5–51.0)
HEMOGLOBIN: 13.2 g/dL (ref 13.0–17.7)
MCH: 28.8 pg (ref 26.6–33.0)
MCHC: 33.7 g/dL (ref 31.5–35.7)
MCV: 86 fL (ref 79–97)
Platelets: 241 10*3/uL (ref 150–450)
RBC: 4.58 x10E6/uL (ref 4.14–5.80)
RDW: 13.4 % (ref 12.3–15.4)
WBC: 6.4 10*3/uL (ref 3.4–10.8)

## 2018-07-03 LAB — BASIC METABOLIC PANEL
BUN/Creatinine Ratio: 12 (ref 9–20)
BUN: 15 mg/dL (ref 6–24)
CALCIUM: 9.9 mg/dL (ref 8.7–10.2)
CHLORIDE: 97 mmol/L (ref 96–106)
CO2: 22 mmol/L (ref 20–29)
Creatinine, Ser: 1.21 mg/dL (ref 0.76–1.27)
GFR, EST AFRICAN AMERICAN: 76 mL/min/{1.73_m2} (ref >59)
GFR, EST NON AFRICAN AMERICAN: 66 mL/min/{1.73_m2} (ref >59)
Glucose: 223 mg/dL — ABNORMAL HIGH (ref 65–99)
Potassium: 4.6 mmol/L (ref 3.5–5.2)
Sodium: 135 mmol/L (ref 134–144)

## 2018-07-03 NOTE — Assessment & Plan Note (Signed)
The most concerning feature treadmill stress test was frequent PVCs and short runs of nonsustained VT during exercise.  Images were reassuring, but this is concerning.  This in conjunction with his general poor feeling about a week or so before his ER visit is concerning for recurrent ischemic event. Plan: Cardiac catheterization.

## 2018-07-03 NOTE — Assessment & Plan Note (Signed)
The stress test is contradictory and that affected the images look pretty similar to 2016 and are negative for ischemia besides may be some mild basal defect it is pretty fixed with may be minimal peri-infarct ischemia. The most concerning feature however is the frequent PVCs with short runs of NSVT near the end of exercise and the fact that he has burning sensation in his chest with walking that he initially thought was related to dyspnea.  Plan: Definitive evaluation with LEFT HEART CATHETERIZATION with CORONARY ANGIOGRAPHY and possible PERCUTANEOUS CORONARY INTERVENTION --> will schedule for the second week of August as he will be out of town.  The patient was well in agreement to proceeding based on his preceding symptoms and is also a stress test.

## 2018-07-03 NOTE — Assessment & Plan Note (Signed)
He is still on Brilinta along with aspirin.  Pending the results of his heart catheterization, if the stent is patent, would probably reduce dose to 60 mg for maintenance. He is on beta-blocker and ACE inhibitor. On statin.

## 2018-07-03 NOTE — Assessment & Plan Note (Addendum)
This year visit was worsening very atypical sounding symptoms for angina, but did seem to be similar to the preliminary symptoms prior to his MI.  The fact that he had similar symptoms on the treadmill during his stress test as well as shortly before his ER visit with a prolonged weekend of feeling poorly, I am concerned that there may have been a new cardiac event.  Plan: Cardiac cath as scheduled.   Will increase beta-blocker dose. -

## 2018-07-07 ENCOUNTER — Telehealth: Payer: Self-pay | Admitting: *Deleted

## 2018-07-07 NOTE — Telephone Encounter (Signed)
LEFT MESSAGE TO PATIENT TO CALL BACK IN REGARDS TO LAB RESULTS.   ALSO PATIENT DOES NOT NEED APPOINTMENT ON 8/12 WITH LAWRENCE DNP UNLESS PATIENT WANTS TO COME.   PATIENT HAS ALREADY BEEN SCHEDULE TO HAVE CARDIAC CATH ON 07/1518 WITH DR Tamala Julian.

## 2018-07-07 NOTE — Telephone Encounter (Signed)
-----   Message from Leonie Man, MD sent at 07/04/2018 12:02 PM EDT ----- [name] [name] labs look pretty good.  Kidney function is still within normal limits, would probably want to make sure he drinks plenty of fluid before heart cath. Glucose levels are higher than they had been last time.   Stable blood counts.  Glenetta Hew, MD

## 2018-07-13 ENCOUNTER — Ambulatory Visit: Payer: 59 | Admitting: Adult Health

## 2018-07-14 ENCOUNTER — Telehealth: Payer: Self-pay | Admitting: *Deleted

## 2018-07-14 NOTE — Telephone Encounter (Signed)
Pt contacted pre-catheterization scheduled at [facility] for: Thursday July 16, 2018 7:30 AM Verified arrival time and place: [place] Entrance A at: 5:30 AM  No solid food after midnight prior to cath, clear liquids until 5 AM day of procedure.  Hold: Metformin AM of procedure and 48 hours post procedure. Glipizide AM of procedure. Pt reports he is not using Insulin at this time.  Except hold medications AM meds can be  taken pre-cath with sip of water including: Brilinta 90 mg ASA 81 mg  Confirmed patient has responsible person to drive home post procedure and for 24 hours after you arrive home: yes

## 2018-07-16 ENCOUNTER — Other Ambulatory Visit: Payer: Self-pay

## 2018-07-16 ENCOUNTER — Ambulatory Visit (HOSPITAL_COMMUNITY)
Admission: RE | Admit: 2018-07-16 | Discharge: 2018-07-16 | Disposition: A | Discharge status: Home / Self Care | Payer: 59 | Source: Ambulatory Visit | Attending: Interventional Cardiology | Admitting: Interventional Cardiology

## 2018-07-16 ENCOUNTER — Encounter (HOSPITAL_COMMUNITY): Payer: Self-pay | Admitting: Interventional Cardiology

## 2018-07-16 ENCOUNTER — Encounter (HOSPITAL_COMMUNITY)
Admission: RE | Disposition: A | Discharge status: Home / Self Care | Payer: Self-pay | Source: Ambulatory Visit | Attending: Interventional Cardiology

## 2018-07-16 DIAGNOSIS — Z9861 Coronary angioplasty status: Secondary | ICD-10-CM

## 2018-07-16 DIAGNOSIS — Z955 Presence of coronary angioplasty implant and graft: Secondary | ICD-10-CM | POA: Insufficient documentation

## 2018-07-16 DIAGNOSIS — E782 Mixed hyperlipidemia: Secondary | ICD-10-CM | POA: Diagnosis present

## 2018-07-16 DIAGNOSIS — Z8249 Family history of ischemic heart disease and other diseases of the circulatory system: Secondary | ICD-10-CM | POA: Diagnosis not present

## 2018-07-16 DIAGNOSIS — I208 Other forms of angina pectoris: Secondary | ICD-10-CM | POA: Diagnosis present

## 2018-07-16 DIAGNOSIS — I252 Old myocardial infarction: Secondary | ICD-10-CM | POA: Diagnosis not present

## 2018-07-16 DIAGNOSIS — R9439 Abnormal result of other cardiovascular function study: Secondary | ICD-10-CM | POA: Diagnosis present

## 2018-07-16 DIAGNOSIS — Z7902 Long term (current) use of antithrombotics/antiplatelets: Secondary | ICD-10-CM | POA: Insufficient documentation

## 2018-07-16 DIAGNOSIS — I472 Ventricular tachycardia: Secondary | ICD-10-CM

## 2018-07-16 DIAGNOSIS — I10 Essential (primary) hypertension: Secondary | ICD-10-CM | POA: Diagnosis not present

## 2018-07-16 DIAGNOSIS — Z87891 Personal history of nicotine dependence: Secondary | ICD-10-CM | POA: Insufficient documentation

## 2018-07-16 DIAGNOSIS — I4729 Other ventricular tachycardia: Secondary | ICD-10-CM

## 2018-07-16 DIAGNOSIS — Z794 Long term (current) use of insulin: Secondary | ICD-10-CM | POA: Diagnosis not present

## 2018-07-16 DIAGNOSIS — E119 Type 2 diabetes mellitus without complications: Secondary | ICD-10-CM | POA: Insufficient documentation

## 2018-07-16 DIAGNOSIS — I2510 Atherosclerotic heart disease of native coronary artery without angina pectoris: Secondary | ICD-10-CM

## 2018-07-16 DIAGNOSIS — E1149 Type 2 diabetes mellitus with other diabetic neurological complication: Secondary | ICD-10-CM | POA: Diagnosis present

## 2018-07-16 DIAGNOSIS — I25118 Atherosclerotic heart disease of native coronary artery with other forms of angina pectoris: Secondary | ICD-10-CM | POA: Insufficient documentation

## 2018-07-16 HISTORY — PX: LEFT HEART CATH AND CORONARY ANGIOGRAPHY: CATH118249

## 2018-07-16 LAB — GLUCOSE, CAPILLARY: GLUCOSE-CAPILLARY: 238 mg/dL — AB (ref 70–99)

## 2018-07-16 SURGERY — LEFT HEART CATH AND CORONARY ANGIOGRAPHY
Anesthesia: LOCAL

## 2018-07-16 MED ORDER — FENTANYL CITRATE (PF) 100 MCG/2ML IJ SOLN
INTRAMUSCULAR | Status: AC
  Filled 2018-07-16: qty 2

## 2018-07-16 MED ORDER — SODIUM CHLORIDE 0.9% FLUSH: 3.0000 mL | INTRAVENOUS | Status: DC | PRN

## 2018-07-16 MED ORDER — ONDANSETRON HCL 4 MG/2ML IJ SOLN: 4.0000 mg | Freq: Four times a day (QID) | INTRAMUSCULAR | Status: DC | PRN

## 2018-07-16 MED ORDER — HEPARIN SODIUM (PORCINE) 1000 UNIT/ML IJ SOLN
INTRAMUSCULAR | Status: DC | PRN
  Administered 2018-07-16: 5000 [IU] via INTRAVENOUS

## 2018-07-16 MED ORDER — MIDAZOLAM HCL 2 MG/2ML IJ SOLN
INTRAMUSCULAR | Status: DC | PRN
  Administered 2018-07-16 (×2): 1 mg via INTRAVENOUS

## 2018-07-16 MED ORDER — LIDOCAINE HCL (PF) 1 % IJ SOLN
INTRAMUSCULAR | Status: DC | PRN
  Administered 2018-07-16: 3 mL via SUBCUTANEOUS

## 2018-07-16 MED ORDER — LIDOCAINE HCL (PF) 1 % IJ SOLN
INTRAMUSCULAR | Status: AC
  Filled 2018-07-16: qty 30

## 2018-07-16 MED ORDER — ASPIRIN 81 MG PO CHEW: 81.0000 mg | CHEWABLE_TABLET | ORAL | Status: DC

## 2018-07-16 MED ORDER — VERAPAMIL HCL 2.5 MG/ML IV SOLN
INTRAVENOUS | Status: AC
  Filled 2018-07-16: qty 2

## 2018-07-16 MED ORDER — SODIUM CHLORIDE 0.9 % WEIGHT BASED INFUSION: 1.0000 mL/kg/h | INTRAVENOUS | Status: DC

## 2018-07-16 MED ORDER — IOPAMIDOL (ISOVUE-370) INJECTION 76%
INTRAVENOUS | Status: DC | PRN
  Administered 2018-07-16: 60 mL via INTRA_ARTERIAL

## 2018-07-16 MED ORDER — VERAPAMIL HCL 2.5 MG/ML IV SOLN
INTRAVENOUS | Status: DC | PRN
  Administered 2018-07-16: 10 mL via INTRA_ARTERIAL

## 2018-07-16 MED ORDER — IOPAMIDOL (ISOVUE-370) INJECTION 76%
INTRAVENOUS | Status: AC
  Filled 2018-07-16: qty 100

## 2018-07-16 MED ORDER — SODIUM CHLORIDE 0.9 % IV SOLN: 250.0000 mL | INTRAVENOUS | Status: DC | PRN

## 2018-07-16 MED ORDER — SODIUM CHLORIDE 0.9 % WEIGHT BASED INFUSION
3.0000 mL/kg/h | INTRAVENOUS | Status: DC
  Administered 2018-07-16: 3 mL/kg/h via INTRAVENOUS

## 2018-07-16 MED ORDER — ACETAMINOPHEN 325 MG PO TABS: 650.0000 mg | ORAL_TABLET | ORAL | Status: DC | PRN

## 2018-07-16 MED ORDER — HEPARIN (PORCINE) IN NACL 1000-0.9 UT/500ML-% IV SOLN
INTRAVENOUS | Status: AC
  Filled 2018-07-16: qty 1000

## 2018-07-16 MED ORDER — HEPARIN (PORCINE) IN NACL 1000-0.9 UT/500ML-% IV SOLN
INTRAVENOUS | Status: DC | PRN
  Administered 2018-07-16 (×2): 500 mL

## 2018-07-16 MED ORDER — HEPARIN SODIUM (PORCINE) 1000 UNIT/ML IJ SOLN
INTRAMUSCULAR | Status: AC
  Filled 2018-07-16: qty 1

## 2018-07-16 MED ORDER — SODIUM CHLORIDE 0.9% FLUSH
3.0000 mL | INTRAVENOUS | Status: DC | PRN
Start: 2018-07-16 — End: 2018-07-16

## 2018-07-16 MED ORDER — FENTANYL CITRATE (PF) 100 MCG/2ML IJ SOLN
INTRAMUSCULAR | Status: DC | PRN
  Administered 2018-07-16: 50 ug via INTRAVENOUS

## 2018-07-16 MED ORDER — MIDAZOLAM HCL 2 MG/2ML IJ SOLN
INTRAMUSCULAR | Status: AC
  Filled 2018-07-16: qty 2

## 2018-07-16 MED ORDER — SODIUM CHLORIDE 0.9% FLUSH: 3.0000 mL | Freq: Two times a day (BID) | INTRAVENOUS | Status: DC

## 2018-07-16 MED ORDER — SODIUM CHLORIDE 0.9 % IV SOLN: INTRAVENOUS | Status: DC

## 2018-07-16 SURGICAL SUPPLY — 8 items
CATH 5FR JL3.5 JR4 ANG PIG MP (CATHETERS) ×2 IMPLANT
GLIDESHEATH SLEND A-KIT 6F 22G (SHEATH) ×2 IMPLANT
GUIDEWIRE INQWIRE 1.5J.035X260 (WIRE) ×1 IMPLANT
INQWIRE 1.5J .035X260CM (WIRE) ×2
KIT HEART LEFT (KITS) ×2 IMPLANT
PACK CARDIAC CATHETERIZATION (CUSTOM PROCEDURE TRAY) ×2 IMPLANT
TRANSDUCER W/STOPCOCK (MISCELLANEOUS) ×2 IMPLANT
TUBING CIL FLEX 10 FLL-RA (TUBING) ×2 IMPLANT

## 2018-07-16 NOTE — Discharge Instructions (Signed)
**Note Dyana Magner-identified via Obfuscation** Radial Site Care °Refer to this sheet in the next few weeks. These instructions provide you with information about caring for yourself after your procedure. Your health care provider may also give you more specific instructions. Your treatment has been planned according to current medical practices, but problems sometimes occur. Call your health care provider if you have any problems or questions after your procedure. °What can I expect after the procedure? °After your procedure, it is typical to have the following: °· Bruising at the radial site that usually fades within 1-2 weeks. °· Blood collecting in the tissue (hematoma) that may be painful to the touch. It should usually decrease in size and tenderness within 1-2 weeks. ° °Follow these instructions at home: °· Take medicines only as directed by your health care provider. °· You may shower 24-48 hours after the procedure or as directed by your health care provider. Remove the bandage (dressing) and gently wash the site with plain soap and water. Pat the area dry with a clean towel. Do not rub the site, because this may cause bleeding. °· Do not take baths, swim, or use a hot tub until your health care provider approves. °· Check your insertion site every day for redness, swelling, or drainage. °· Do not apply powder or lotion to the site. °· Do not flex or bend the affected arm for 24 hours or as directed by your health care provider. °· Do not push or pull heavy objects with the affected arm for 24 hours or as directed by your health care provider. °· Do not lift over 10 lb (4.5 kg) for 5 days after your procedure or as directed by your health care provider. °· Ask your health care provider when it is okay to: °? Return to work or school. °? Resume usual physical activities or sports. °? Resume sexual activity. °· Do not drive home if you are discharged the same day as the procedure. Have someone else drive you. °· You may drive 24 hours after the procedure  unless otherwise instructed by your health care provider. °· Do not operate machinery or power tools for 24 hours after the procedure. °· If your procedure was done as an outpatient procedure, which means that you went home the same day as your procedure, a responsible adult should be with you for the first 24 hours after you arrive home. °· Keep all follow-up visits as directed by your health care provider. This is important. °Contact a health care provider if: °· You have a fever. °· You have chills. °· You have increased bleeding from the radial site. Hold pressure on the site. °Get help right away if: °· You have unusual pain at the radial site. °· You have redness, warmth, or swelling at the radial site. °· You have drainage (other than a small amount of blood on the dressing) from the radial site. °· The radial site is bleeding, and the bleeding does not stop after 30 minutes of holding steady pressure on the site. °· Your arm or hand becomes pale, cool, tingly, or numb. °This information is not intended to replace advice given to you by your health care provider. Make sure you discuss any questions you have with your health care provider. °Document Released: 12/21/2010 Document Revised: 04/25/2016 Document Reviewed: 06/06/2014 °Elsevier Interactive Patient Education © 2018 [employer] ° °

## 2018-07-16 NOTE — Interval H&P Note (Signed)
Cath Lab Visit (complete for each Cath Lab visit)  Clinical Evaluation Leading to the Procedure:   ACS: No.  Non-ACS:    Anginal Classification: CCS II  Anti-ischemic medical therapy: Minimal Therapy (1 class of medications)  Non-Invasive Test Results: Low-risk stress test findings: cardiac mortality <1%/year  Prior CABG: No previous CABG      History and Physical Interval Note:  07/16/2018 8:53 AM  [name] [name]  has presented today for surgery, with the diagnosis of abnormal nuc stress  The various methods of treatment have been discussed with the patient and family. After consideration of risks, benefits and other options for treatment, the patient has consented to  Procedure(s): LEFT HEART CATH AND CORONARY ANGIOGRAPHY (N/A) as a surgical intervention .  The patient's history has been reviewed, patient examined, no change in status, stable for surgery.  I have reviewed the patient's chart and labs.  Questions were answered to the patient's satisfaction.     [name] [name]

## 2018-07-29 ENCOUNTER — Other Ambulatory Visit: Payer: Self-pay | Admitting: Family Medicine

## 2018-08-13 ENCOUNTER — Encounter: Payer: Self-pay | Admitting: Adult Health

## 2018-08-13 ENCOUNTER — Ambulatory Visit (INDEPENDENT_AMBULATORY_CARE_PROVIDER_SITE_OTHER): Payer: 59 | Admitting: Adult Health

## 2018-08-13 ENCOUNTER — Ambulatory Visit (INDEPENDENT_AMBULATORY_CARE_PROVIDER_SITE_OTHER): Payer: 59 | Admitting: Pharmacist

## 2018-08-13 VITALS — BP 116/80 | HR 67 | Ht 70.5 in | Wt 213.2 lb

## 2018-08-13 DIAGNOSIS — E782 Mixed hyperlipidemia: Secondary | ICD-10-CM

## 2018-08-13 DIAGNOSIS — Z794 Long term (current) use of insulin: Secondary | ICD-10-CM

## 2018-08-13 DIAGNOSIS — I10 Essential (primary) hypertension: Secondary | ICD-10-CM

## 2018-08-13 DIAGNOSIS — IMO0001 Reserved for inherently not codable concepts without codable children: Secondary | ICD-10-CM

## 2018-08-13 DIAGNOSIS — I2510 Atherosclerotic heart disease of native coronary artery without angina pectoris: Secondary | ICD-10-CM

## 2018-08-13 DIAGNOSIS — E119 Type 2 diabetes mellitus without complications: Secondary | ICD-10-CM

## 2018-08-13 DIAGNOSIS — E7800 Pure hypercholesterolemia, unspecified: Secondary | ICD-10-CM

## 2018-08-13 MED ORDER — ICOSAPENT ETHYL 1 G PO CAPS: 1.0000 g | ORAL_CAPSULE | Freq: Two times a day (BID) | ORAL | 1 refills | Status: DC

## 2018-08-13 MED ORDER — TICAGRELOR 60 MG PO TABS: 60.0000 mg | ORAL_TABLET | Freq: Two times a day (BID) | ORAL | 6 refills | Status: DC

## 2018-08-13 MED ORDER — ROSUVASTATIN CALCIUM 40 MG PO TABS: 40.0000 mg | ORAL_TABLET | Freq: Every day | ORAL | 3 refills | Status: DC

## 2018-08-13 NOTE — Progress Notes (Signed)
Patient ID: Jarry Manon III                 DOB: 01-17-1961                    MRN: [id number]     HPI: Jru Pense III is a 57 y.o. male patient referred to lipid clinic by Dr Ellyn Hack. PMH is significant for CAD s/p angioplasty, STEMI ,atypical angina, mixed hyperlipidemia, and uncontrolled diabetes. His most recent lipid panel shows TG at 642 mg/dL and unable to calculate LDL.  Patient expressed compliance with atorvastatin and fenofibrate therapy. He also taking Krill oil every day and is currently undergoing DM management by PCP.  Patient is to initiate daily insulin this week as prescribed by provider.  Patient presents today for lipid management and potential PCSK9i initiation.   Current Medications:  Atorvastatin 40mg  daily Fenofibrate 160mg  daily  LDL goal: < 70mg /dL (prefernce < 50mg /dL)  Diet: low fat and low carbs for diabetes management  Exercise: activities of daily living  Family History: includes Cancer in his father and mother; Diabetes in his father; Hearing loss in his brother; Heart disease in his paternal grandfather; Hyperlipidemia in his father; Hypertension in his father.  Social History: former smoker, occasional alcohol use  Labs: 01/22/18: CHO 212; TG 642; HDL 30; LDL-c unable to calculate (atorvastatin 40mg  , fenofibrate 160mg  , Krill oil 300mg )  Past Medical History:  Diagnosis Date  . Allergic rhinitis   . CAD S/P percutaneous coronary angioplasty 12/03/2013   PCI-RCA - Xience Alpine DES 3.0 mm x 18 mm --> 3.5 mm; Echo 1/'15" EF 45-50%, mild C LVH, mild Inf HK, Gr 1 DD  . Diabetes mellitus    type2  . Fracture of right hand 1983  . Hyperlipidemia   . Hypertension   . Nocturia   . Paresthesia    tingling in feet  . STEMI (ST elevation myocardial infarction) of inf. wall 12/03/2013    Current Outpatient Medications on File Prior to Visit  Medication Sig Dispense Refill  . cetirizine (ZYRTEC) 10 MG tablet Take 10 mg by mouth daily.    .  Continuous Blood Gluc Receiver (FREESTYLE LIBRE 14 DAY READER) DEVI 1 each by Continuous infusion (non-IV) route daily. Use to monitor blood glucose.  Diagnosis: E11.49  Insulin dependent. 1 Device 0  . fenofibrate 160 MG tablet Take 1 tablet (160 mg total) by mouth daily. 90 tablet 3  . folic acid ([facility] FOLIC ACID) 884 MCG tablet Take 1 tablet (400 mcg total) by mouth daily.    Marland Kitchen glipiZIDE (GLUCOTROL) 5 MG tablet TAKE 1 TABLET BY MOUTH  DAILY BEFORE BREAKFAST 90 tablet 1  . glucose blood (ONETOUCH VERIO) test strip Use as instructed to check blood sugar daily and as needed.  Diagnosis: E11.49  Non insulin dependent. 100 each 3  . Insulin Glargine (BASAGLAR KWIKPEN) 100 UNIT/ML SOPN Inject 0.05-0.2 mLs (5-20 Units total) into the skin at bedtime. 5 pen 12  . Insulin Pen Needle 31G X 5 MM MISC Use daily with insulin pen 100 each 3  . lisinopril (PRINIVIL,ZESTRIL) 2.5 MG tablet Take 1 tablet (2.5 mg total) by mouth daily. 90 tablet 3  . metFORMIN (GLUCOPHAGE) 500 MG tablet TAKE 1 TABLET BY MOUTH  TWICE A DAY WITH MEALS 180 tablet 3  . metoprolol tartrate (LOPRESSOR) 25 MG tablet Take 1 tablet (25 mg total) by mouth 2 (two) times daily. 180 tablet 3  . NITROSTAT 0.4 MG  SL tablet DISSOLVE 1 TABLET UNDER THE TONGUE FOR CHEST PAIN. MAY REPEAT EVERY 5MINUTES UP TO 3 DOSES. IF NO RELIEF, CALL 911** (Patient taking differently: Place 0.4 mg under the tongue every 5 (five) minutes as needed for chest pain. ) 25 tablet 1  . ONETOUCH DELICA LANCETS 71I MISC Use as instructed to check blood sugar once daily or as needed.  Diagnosis:  E11.49  Non insulin dependent. 100 each 3  . ranitidine (ZANTAC) 150 MG tablet Take 1 tablet (150 mg total) by mouth 2 (two) times daily. 60 tablet 0  . ticagrelor (BRILINTA) 60 MG TABS tablet Take 1 tablet (60 mg total) by mouth 2 (two) times daily. 30 tablet 6  . vitamin B-12 (CYANOCOBALAMIN) 1000 MCG tablet Take 1 tablet (1,000 mcg total) by mouth daily.    . vitamin B-6  (PYRIDOXINE) 25 MG tablet Take 1 tablet (25 mg total) by mouth daily.     No current facility-administered medications on file prior to visit.     Allergies  Allergen Reactions  . Metformin And Related     Intolerant of more than 1000mg  a day    HYPERLIPIDEMIA, MIXED -- significant hypertriglyceridemia TG and LDL significantly above desired goal for secondary prevention. Patients A1C currently at 9% and due to initiate insulin therapy. He is not a good candidate for PCSK9i at this time, patient has to continue working on DM control. Patient is not very receptive to needles either and will like oral therapy as long as possible. Will stop atorvastatin 40mg  and Krill oil. Start rosuvastatin 40mg  daily, and Vascepa 1g twice daily. He is to continue fenofibrate 160mg , lifestyle modification, and insulin regiment as recommended by PCP. Plan to repeat blood work in 3 months to reassess therapy and consider PCSK9i if needed. PA for Vacepa submitted to insurance.   Maryama Kuriakose Rodriguez-Guzman PharmD, BCPS, [place] [address] ,[place] [address] 08/17/2018 9:04 AM

## 2018-08-13 NOTE — Patient Instructions (Signed)
Medication Instructions:  DECREASE BRILINTA 60MG  TWICE DAILY  If you need a refill on your cardiac medications before your next appointment, please call your pharmacy.  Special Instructions: Please call [facility] network for Physician for ENDOCRINE referral at: [phone] -or- [email or web address]   Follow-Up: Your physician wants you to follow-up in: [place] should receive a reminder letter in the mail two months in advance. If you do not receive a letter, please call our office in JAN 2020 to schedule your [facility] 2020 follow-up appointment.   Thank you for choosing [facility]!!

## 2018-08-13 NOTE — Patient Instructions (Addendum)
Lipid Clinic (pharmacist) Kess Mcilwain/Kristin [phone]  *STOP taking atorvastatin* *START rosuvastatin 40mg  daily* *STOP taking Krill oil* *START taking Vascepa 1gm twice daily*   Cholesterol Cholesterol is a fat. Your body needs a small amount of cholesterol. Cholesterol (plaque) may build up in your blood vessels (arteries). That makes you more likely to have a heart attack or stroke. You cannot feel your cholesterol level. Having a blood test is the only way to find out if your level is high. Keep your test results. Work with your doctor to keep your cholesterol at a good level. What do the results mean?  Total cholesterol is how much cholesterol is in your blood.  LDL is bad cholesterol. This is the type that can build up. Try to have low LDL.  HDL is good cholesterol. It cleans your blood vessels and carries LDL away. Try to have high HDL.  Triglycerides are fat that the body can store or burn for energy. What are good levels of cholesterol?  Total cholesterol below 200.  LDL below 100 is good for people who have health risks. LDL below 70 is good for people who have very high risks.  HDL above 40 is good. It is best to have HDL of 60 or higher.  Triglycerides below 150. How can I lower my cholesterol? Diet Follow your diet program as told by your doctor.  Choose fish, white meat chicken, or [place] that is roasted or baked. Try not to eat red meat, fried foods, sausage, or lunch meats.  Eat lots of fresh fruits and vegetables.  Choose whole grains, beans, pasta, potatoes, and cereals.  Choose olive oil, corn oil, or canola oil. Only use small amounts.  Try not to eat butter, mayonnaise, shortening, or palm kernel oils.  Try not to eat foods with trans fats.  Choose low-fat or nonfat dairy foods. ? Drink skim or nonfat milk. ? Eat low-fat or nonfat yogurt and cheeses. ? Try not to drink whole milk or cream. ? Try not to eat ice cream, egg yolks, or full-fat  cheeses.  Healthy desserts include angel food cake, ginger snaps, animal crackers, hard candy, popsicles, and low-fat or nonfat frozen yogurt. Try not to eat pastries, cakes, pies, and cookies.  Exercise Follow your exercise program as told by your doctor.  Be more active. Try gardening, walking, and taking the stairs.  Ask your doctor about ways that you can be more active.  Medicine  Take over-the-counter and prescription medicines only as told by your doctor. This information is not intended to replace advice given to you by your health care provider. Make sure you discuss any questions you have with your health care provider. Document Released: 02/14/2009 Document Revised: 06/19/2016 Document Reviewed: 05/30/2016 Elsevier Interactive Patient Education  [employer].

## 2018-08-13 NOTE — Progress Notes (Signed)
Cardiology Office Note   Date:  08/13/2018   ID:  [name] [name], DOB [birth year], MRN [id number]  PCP:  Tonia Ghent, MD  Cardiologist:  Dr. Ellyn Hack   Chief Complaint  Patient presents with  . Follow-up  . Coronary Artery Disease  . Hospitalization Follow-up     History of Present Illness: [name] [name] is a 57 y.o. male who presents for ongoing assessment and management of CAD with hx of inferior MI in 12/2013, with PCI and DES to the RCA, hypertension, diabetes and hyperlipidemia. He was seen last on 07/02/2018 to discuss stress test results, with episodes of frequent PVC's and NSVT, with self reported chest burning and dyspnea. He was therefore scheduled for cardiac cath.   Cardiac cath on 07/16/2018  Conclusion    Right dominant coronary anatomy with widely patent stent in the mid RCA.  No significant obstruction is noted in the right coronary.  Normal left main.  Tortuous but widely patent LAD that wraps around the left ventricular apex.  Normal circumflex with tortuous obtuse marginal branches.  Inferior wall hypokinesis, LVEF 55%, with normal left ventricular end-diastolic pressure.  RECOMMENDATIONS:   Continue aggressive risk factor modification  Consider discontinuation of Brilinta (and possibly adding Plavix) to decrease bleeding risk going forward.   He is without complaints today. He continues to be followed by PCP for diabetes. He is also seen today by Pharmacist who is managing hypertriglyceridemia. Please her note for specifics. Has now been started on Crestor 40 mg and started on Vescepa 1 gm BID, atorvastatin was discontinued.  Past Medical History:  Diagnosis Date  . Allergic rhinitis   . CAD S/P percutaneous coronary angioplasty 12/03/2013   PCI-RCA - Xience Alpine DES 3.0 mm x 18 mm --> 3.5 mm; Echo 1/'15" EF 45-50%, mild C LVH, mild Inf HK, Gr 1 DD  . Diabetes mellitus    type2  . Fracture of right hand 1983  . Hyperlipidemia   .  Hypertension   . Nocturia   . Paresthesia    tingling in feet  . STEMI (ST elevation myocardial infarction) of inf. wall 12/03/2013    Past Surgical History:  Procedure Laterality Date  . KNEE SURGERY     B arthroscopic  . LEFT HEART CATH AND CORONARY ANGIOGRAPHY N/A 07/16/2018   Procedure: LEFT HEART CATH AND CORONARY ANGIOGRAPHY;  Surgeon: Belva Crome, MD;  Location: [facility];  Service: Cardiovascular;  Laterality: N/A;  . LEFT HEART CATHETERIZATION WITH CORONARY ANGIOGRAM N/A 12/03/2013   Procedure: LEFT HEART CATHETERIZATION WITH CORONARY ANGIOGRAM;  Surgeon: Leonie Man, MD;  Location: [facility] CATH [facility];  Service: Cardiovascular;  Laterality: N/A;  . NM MYOVIEW LTD  11/2015; 06/2018   a) Normal; b)  EF 51%.  Findings consistent with prior MI with peri-infarct ischemia (LOW RISK).  Small defect of mild severity in the basal inferior mid inferior location.  There is also horizontal ST segment depression in inferior leads.  Although low risk images, the EKG changes are concerning.  There is concern for possible multivessel CAD.  Marland Kitchen PERCUTANEOUS CORONARY STENT INTERVENTION (PCI-S)  12/03/2013   mRCA - Xience Alpine DES 3.0 mm x 18 mm --> 3.5 mm  . SEPTOPLASTY  1970   due to Fx Nose  . TRANSTHORACIC ECHOCARDIOGRAM  12/03/2013; 11/2014   a. Mild Conc LVH; EF 45-50%, Mild Inferior HK. Grade 1 DD;; b. EF 55-60%. No RWMA. Moderate RV dilation but normal function.  Current Outpatient Medications  Medication Sig Dispense Refill  . cetirizine (ZYRTEC) 10 MG tablet Take 10 mg by mouth daily.    . Continuous Blood Gluc Receiver (FREESTYLE LIBRE 14 DAY READER) DEVI 1 each by Continuous infusion (non-IV) route daily. Use to monitor blood glucose.  Diagnosis: E11.49  Insulin dependent. 1 Device 0  . fenofibrate 160 MG tablet Take 1 tablet (160 mg total) by mouth daily. 90 tablet 3  . folic acid (CVS FOLIC ACID) 623 MCG tablet Take 1 tablet (400 mcg total) by mouth daily.    Marland Kitchen glipiZIDE  (GLUCOTROL) 5 MG tablet TAKE 1 TABLET BY MOUTH  DAILY BEFORE BREAKFAST 90 tablet 1  . glucose blood (ONETOUCH VERIO) test strip Use as instructed to check blood sugar daily and as needed.  Diagnosis: E11.49  Non insulin dependent. 100 each 3  . Icosapent Ethyl 1 g CAPS Take 1 capsule (1 g total) by mouth 2 (two) times daily. To replace Krill oil 120 capsule 1  . Insulin Glargine (BASAGLAR KWIKPEN) 100 UNIT/ML SOPN Inject 0.05-0.2 mLs (5-20 Units total) into the skin at bedtime. 5 pen 12  . Insulin Pen Needle 31G X 5 MM MISC Use daily with insulin pen 100 each 3  . lisinopril (PRINIVIL,ZESTRIL) 2.5 MG tablet Take 1 tablet (2.5 mg total) by mouth daily. 90 tablet 3  . metFORMIN (GLUCOPHAGE) 500 MG tablet TAKE 1 TABLET BY MOUTH  TWICE A DAY WITH MEALS 180 tablet 3  . metoprolol tartrate (LOPRESSOR) 25 MG tablet Take 1 tablet (25 mg total) by mouth 2 (two) times daily. 180 tablet 3  . NITROSTAT 0.4 MG SL tablet DISSOLVE 1 TABLET UNDER THE TONGUE FOR CHEST PAIN. MAY REPEAT EVERY 5MINUTES UP TO 3 DOSES. IF NO RELIEF, CALL 911** (Patient taking differently: Place 0.4 mg under the tongue every 5 (five) minutes as needed for chest pain. ) 25 tablet 1  . ONETOUCH DELICA LANCETS 76E MISC Use as instructed to check blood sugar once daily or as needed.  Diagnosis:  E11.49  Non insulin dependent. 100 each 3  . ranitidine (ZANTAC) 150 MG tablet Take 1 tablet (150 mg total) by mouth 2 (two) times daily. 60 tablet 0  . rosuvastatin (CRESTOR) 40 MG tablet Take 1 tablet (40 mg total) by mouth daily. To replace atorvastatin (lipitor). 90 tablet 3  . ticagrelor (BRILINTA) 60 MG TABS tablet Take 1 tablet (60 mg total) by mouth 2 (two) times daily. 30 tablet 6  . vitamin B-12 (CYANOCOBALAMIN) 1000 MCG tablet Take 1 tablet (1,000 mcg total) by mouth daily.    . vitamin B-6 (PYRIDOXINE) 25 MG tablet Take 1 tablet (25 mg total) by mouth daily.     No current facility-administered medications for this visit.     Allergies:    Metformin and related    Social History:  The patient  reports that he quit smoking about 4 years ago. His smoking use included cigarettes. He has a 10.00 pack-year smoking history. He has never used smokeless tobacco. He reports that he drinks alcohol. He reports that he does not use drugs.   Family History:  The patient's family history includes Cancer in his father and mother; Diabetes in his father; Hearing loss in his brother; Heart disease in his paternal grandfather; Hyperlipidemia in his father; Hypertension in his father.    ROS: All other systems are reviewed and negative. Unless otherwise mentioned in H&P    PHYSICAL EXAM: VS:  BP 116/80   Pulse 67  Ht 5' 10.5" (1.791 m)   Wt 213 lb 3.2 oz (96.7 kg)   BMI 30.16 kg/m  , BMI Body mass index is 30.16 kg/m. GEN: Well nourished, well developed, in no acute distress  HEENT: normal  Neck: no JVD, carotid bruits, or masses Cardiac: RRR; no murmurs, rubs, or gallops,no edema  Respiratory:  clear to auscultation bilaterally, normal work of breathing GI: soft, nontender, nondistended, + BS MS: no deformity or atrophy  Skin: warm and dry, no rash. Well healed cath insertion site.  Neuro:  Strength and sensation are intact Psych: euthymic mood, full affect   EKG:  Not completed today  Recent Labs: 01/22/2018: ALT 35 07/02/2018: BUN 15; Creatinine, Ser 1.21; Hemoglobin 13.2; Platelets 241; Potassium 4.6; Sodium 135    Lipid Panel    Component Value Date/Time   CHOL 212 (H) 01/22/2018 0000   TRIG 642 (HH) 01/22/2018 0000   HDL 30 (L) 01/22/2018 0000   CHOLHDL 7.1 (H) 01/22/2018 0000   CHOLHDL 8 07/24/2017 0803   VLDL 74.0 (H) 12/22/2014 0838   LDLCALC Comment 01/22/2018 0000   LDLDIRECT 91.0 07/24/2017 0803      Wt Readings from Last 3 Encounters:  08/13/18 213 lb 3.2 oz (96.7 kg)  07/16/18 205 lb (93 kg)  07/02/18 214 lb (97.1 kg)      Other studies Reviewed  Stress Test 06/23/2018 Study Highlights     The  left ventricular ejection fraction is mildly decreased (45-54%).  Nuclear stress EF: 51%.  Findings consistent with prior myocardial infarction with peri-infarct ischemia.  This is a low risk study.  Defect 1: There is a small defect of mild severity present in the basal inferior and mid inferior location.  Horizontal ST segment depression ST segment depression of 1 mm was noted during stress in the aVF, [name] and II leads, beginning at 6 minutes of stress, and returning to baseline after less than 1 minute of recovery.  T wave inversion was noted during stress.   Overall low risk stress nuclear study with some worrisome findings. There is a small area of inferobasal ischemia, otherwise normal perfusion and mildly reduced left ventricular global systolic function (EF unchanged form the previous report). Although the perfusion abnormalities are very limited, the ECG changes, with multiple episodes of exercise induced nonsustained VT and ST depression, are concerning for possible multivessel disease.    ASSESSMENT AND PLAN:  1.  CAD: S/P cardiac cath as above. No significant changes in coronary anatomy or new areas of stenosis. False positive stress test. He is not recommended for future NM studies per Dr. Ellyn Hack. Continue secondary prevention. Will decrease Brilinta to 60 mg BID from 90 mg BID as he had his PCI in 2015 with no changes in CAD burden. Continue BB, and ACE.   2. Hyperlipidemia. He is to take new statin regimen as discussed above. Follow up labs per pharmacy are planned.   3. Hypertension:  Controlled currently. No changes in antihypertensive regimen.    4 Diabetes: He wishes to have a self adhesive blood glucose monitor placed. He is recommended to be followed by an Endocrinologist to help to get this in place for him. He is given [facility] phone number to inquire about physician accepting patients.    Current medicines are reviewed at length with the patient today.    Labs/  tests ordered today include: None. Pharmacy ordering follow up labs.  Phill Myron. West Pugh, ANP, AACC   08/13/2018 12:39 PM  [facility] [address] [place], [place] [address] Phone: [phone]; Fax: [phone]

## 2018-08-17 ENCOUNTER — Encounter: Payer: Self-pay | Admitting: Pharmacist

## 2018-08-17 NOTE — Assessment & Plan Note (Signed)
TG and LDL significantly above desired goal for secondary prevention. Patients A1C currently at 9% and due to initiate insulin therapy. He is not a good candidate for PCSK9i at this time, patient has to continue working on DM control. Patient is not very receptive to needles either and will like oral therapy as long as possible. Will stop atorvastatin 40mg  and Krill oil. Start rosuvastatin 40mg  daily, and Vascepa 1g twice daily. He is to continue fenofibrate 160mg , lifestyle modification, and insulin regiment as recommended by PCP. Plan to repeat blood work in 3 months to reassess therapy and consider PCSK9i if needed. PA for Vacepa submitted to insurance.

## 2018-08-24 ENCOUNTER — Other Ambulatory Visit: Payer: Self-pay | Admitting: Pharmacist

## 2018-08-24 MED ORDER — ICOSAPENT ETHYL 1 G PO CAPS: 1.0000 g | ORAL_CAPSULE | Freq: Two times a day (BID) | ORAL | 1 refills | Status: DC

## 2018-10-23 ENCOUNTER — Telehealth: Payer: Self-pay | Admitting: Pharmacist

## 2018-10-23 DIAGNOSIS — E7800 Pure hypercholesterolemia, unspecified: Secondary | ICD-10-CM

## 2018-10-23 NOTE — Telephone Encounter (Signed)
Taking rosuvastatin and Vascepa? Due to repeat fasting lipid panel and LFTs.

## 2018-10-23 NOTE — Addendum Note (Signed)
Addended by: Harrington Challenger on: 10/23/2018 04:19 PM   Modules accepted: Orders

## 2018-10-27 ENCOUNTER — Encounter: Payer: Self-pay | Admitting: Podiatry

## 2018-10-27 ENCOUNTER — Other Ambulatory Visit: Payer: 59 | Admitting: Orthotics

## 2018-10-27 ENCOUNTER — Ambulatory Visit (INDEPENDENT_AMBULATORY_CARE_PROVIDER_SITE_OTHER): Payer: 59 | Admitting: Podiatry

## 2018-10-27 DIAGNOSIS — E119 Type 2 diabetes mellitus without complications: Secondary | ICD-10-CM

## 2018-10-27 DIAGNOSIS — M722 Plantar fascial fibromatosis: Secondary | ICD-10-CM | POA: Diagnosis not present

## 2018-10-28 NOTE — Progress Notes (Signed)
He presents today states that he needs new orthotics he really does not need to see the doctor.  Objective: Vital signs are stable alert and oriented x3.  States a history of plantar fasciitis.  Assessment: Plantar fasciitis.  Plan: New set of orthotics will be made for him.

## 2018-11-05 ENCOUNTER — Other Ambulatory Visit: Payer: Self-pay | Admitting: Family Medicine

## 2018-11-14 ENCOUNTER — Other Ambulatory Visit: Payer: Self-pay | Admitting: Adult Health

## 2018-11-15 ENCOUNTER — Other Ambulatory Visit: Payer: Self-pay | Admitting: Cardiology

## 2018-11-18 ENCOUNTER — Other Ambulatory Visit: Payer: Self-pay | Admitting: Adult Health

## 2018-11-18 NOTE — Telephone Encounter (Signed)
°*  STAT* If patient is at the pharmacy, call can be transferred to refill team.   1. Which medications need to be refilled? (please list name of each medication and dose if known)  Brilinta and Rosuvastatin  2. Which pharmacy/location (including street and city if local pharmacy) is medication to be sent to? [facility] [place] [place]  3. Do they need a 30 day or 90 day supply? 160 for Brlinta and refills and 90 for Rosuvastatin

## 2018-11-20 ENCOUNTER — Telehealth: Payer: Self-pay | Admitting: *Deleted

## 2018-11-20 MED ORDER — TICAGRELOR 60 MG PO TABS: 60.0000 mg | ORAL_TABLET | Freq: Two times a day (BID) | ORAL | 3 refills | Status: DC

## 2018-11-20 MED ORDER — ROSUVASTATIN CALCIUM 40 MG PO TABS: 40.0000 mg | ORAL_TABLET | Freq: Every day | ORAL | 3 refills | Status: DC

## 2018-11-20 NOTE — Telephone Encounter (Signed)
SPOKE TO PATIENT CHANGE TO MAIL ORDER

## 2018-11-20 NOTE — Telephone Encounter (Signed)
INFORMATION SENT  FROM [facility] REQUESTING CLARIFICATION OF MEDICATIONS  PLAVIX   PLAVIX DISCONTINUE , RESTART  BRILINTA 60 MG TWICE  Day  Spoke to patient  - he states he would like both brilinta 60 MG and rosuvastatin 40 MG to be sent to mail order  [facility]

## 2018-12-09 LAB — HM DIABETES EYE EXAM

## 2018-12-19 ENCOUNTER — Other Ambulatory Visit: Payer: Self-pay | Admitting: Family Medicine

## 2018-12-21 ENCOUNTER — Encounter: Payer: Self-pay | Admitting: *Deleted

## 2018-12-25 ENCOUNTER — Encounter: Payer: Self-pay | Admitting: Family Medicine

## 2019-01-07 ENCOUNTER — Other Ambulatory Visit: Payer: Self-pay | Admitting: Family Medicine

## 2019-01-07 DIAGNOSIS — E1149 Type 2 diabetes mellitus with other diabetic neurological complication: Secondary | ICD-10-CM

## 2019-01-08 ENCOUNTER — Other Ambulatory Visit (INDEPENDENT_AMBULATORY_CARE_PROVIDER_SITE_OTHER): Payer: 59

## 2019-01-08 DIAGNOSIS — E1149 Type 2 diabetes mellitus with other diabetic neurological complication: Secondary | ICD-10-CM

## 2019-01-08 LAB — COMPREHENSIVE METABOLIC PANEL
ALK PHOS: 49 U/L (ref 39–117)
ALT: 29 U/L (ref 0–53)
AST: 18 U/L (ref 0–37)
Albumin: 4.6 g/dL (ref 3.5–5.2)
BUN: 14 mg/dL (ref 6–23)
CO2: 24 mEq/L (ref 19–32)
Calcium: 9.4 mg/dL (ref 8.4–10.5)
Chloride: 99 mEq/L (ref 96–112)
Creatinine, Ser: 0.85 mg/dL (ref 0.40–1.50)
GFR: 92.2 mL/min (ref >60.00)
GLUCOSE: 272 mg/dL — AB (ref 70–99)
Potassium: 4.3 mEq/L (ref 3.5–5.1)
Sodium: 134 mEq/L — ABNORMAL LOW (ref 135–145)
Total Bilirubin: 0.6 mg/dL (ref 0.2–1.2)
Total Protein: 7 g/dL (ref 6.0–8.3)

## 2019-01-08 LAB — CBC WITH DIFFERENTIAL/PLATELET
Basophils Absolute: 0 10*3/uL (ref 0.0–0.1)
Basophils Relative: 0.7 % (ref 0.0–3.0)
Eosinophils Absolute: 0.2 10*3/uL (ref 0.0–0.7)
Eosinophils Relative: 3.6 % (ref 0.0–5.0)
HCT: 40.7 % (ref 39.0–52.0)
Hemoglobin: 14.1 g/dL (ref 13.0–17.0)
LYMPHS ABS: 2.8 10*3/uL (ref 0.7–4.0)
Lymphocytes Relative: 42.1 % (ref 12.0–46.0)
MCHC: 34.7 g/dL (ref 30.0–36.0)
MCV: 84.5 fl (ref 78.0–100.0)
MONO ABS: 0.6 10*3/uL (ref 0.1–1.0)
Monocytes Relative: 8.2 % (ref 3.0–12.0)
Neutro Abs: 3.1 10*3/uL (ref 1.4–7.7)
Neutrophils Relative %: 45.4 % (ref 43.0–77.0)
Platelets: 240 10*3/uL (ref 150.0–400.0)
RBC: 4.81 Mil/uL (ref 4.22–5.81)
RDW: 13.2 % (ref 11.5–15.5)
WBC: 6.7 10*3/uL (ref 4.0–10.5)

## 2019-01-08 LAB — LIPID PANEL
Cholesterol: 159 mg/dL (ref 0–200)
HDL: 26.3 mg/dL — ABNORMAL LOW (ref >39.00)
Total CHOL/HDL Ratio: 6
Triglycerides: 705 mg/dL — ABNORMAL HIGH (ref 0.0–149.0)

## 2019-01-08 LAB — TSH: TSH: 1.3 u[IU]/mL (ref 0.35–4.50)

## 2019-01-08 LAB — HEMOGLOBIN A1C: Hgb A1c MFr Bld: 10.7 % — ABNORMAL HIGH (ref 4.6–6.5)

## 2019-01-08 LAB — LDL CHOLESTEROL, DIRECT: LDL DIRECT: 52 mg/dL

## 2019-01-08 LAB — VITAMIN B12: Vitamin B-12: 526 pg/mL (ref 211–911)

## 2019-01-12 ENCOUNTER — Ambulatory Visit (INDEPENDENT_AMBULATORY_CARE_PROVIDER_SITE_OTHER): Payer: 59 | Admitting: Family Medicine

## 2019-01-12 ENCOUNTER — Encounter: Payer: Self-pay | Admitting: Family Medicine

## 2019-01-12 VITALS — BP 130/66 | HR 84 | Temp 98.3°F | Ht 70.5 in | Wt 210.2 lb

## 2019-01-12 DIAGNOSIS — Z0000 Encounter for general adult medical examination without abnormal findings: Secondary | ICD-10-CM

## 2019-01-12 DIAGNOSIS — E782 Mixed hyperlipidemia: Secondary | ICD-10-CM

## 2019-01-12 DIAGNOSIS — E1149 Type 2 diabetes mellitus with other diabetic neurological complication: Secondary | ICD-10-CM

## 2019-01-12 DIAGNOSIS — Z7189 Other specified counseling: Secondary | ICD-10-CM

## 2019-01-12 MED ORDER — ICOSAPENT ETHYL 1 G PO CAPS: 1.0000 g | ORAL_CAPSULE | Freq: Two times a day (BID) | ORAL | 1 refills | Status: DC

## 2019-01-12 MED ORDER — FOLIC ACID 400 MCG PO TABS: 400.0000 ug | ORAL_TABLET | Freq: Every day | ORAL | 3 refills | Status: DC

## 2019-01-12 NOTE — Patient Instructions (Addendum)
Get an appointment for me to help you with an insulin start.  Bring your needles and pens and we'll go from there.  Ask for a 30 minute appointment.  Check with your insurance to see if they will cover the shingrix shot. Take care.  Glad to see you.

## 2019-01-12 NOTE — Progress Notes (Signed)
CPE- See plan.  Routine anticipatory guidance given to patient.  See health maintenance.  The possibility exists that previously documented standard health maintenance information may have been brought forward from a previous encounter into this note.  If needed, that same information has been updated to reflect the current situation based on today's encounter.    Tetanus 2015  Flu shot ~09/2018.  PNA prev done.  Shingles d/w pt.  Prostate cancer screening and PSA options (with potential risks and benefits of testing vs not testing) were discussed along with recent recs/guidelines. He declined testing PSA at this point.  Colonoscopy 2011  Living will d/w pt. Wife designated if patient were incapacitated.  Diet and exercise d/w pt. Encouraged on both.  Diabetes:  He is going to start insulin after discussion.   Labs d/w pt.  Still with foot pain at baseline.  Still with significantly elevated A1c.  HLD.  Defer tx as A1c elevation likely precludes any meaningful change in his triglyceride level at this point.  Discussed.  PMH and SH reviewed  Meds, vitals, and allergies reviewed.   ROS: Per HPI.  Unless specifically indicated otherwise in HPI, the patient denies:  General: fever. Eyes: acute vision changes ENT: sore throat Cardiovascular: chest pain Respiratory: SOB GI: vomiting GU: dysuria Musculoskeletal: acute back pain Derm: acute rash Neuro: acute motor dysfunction Psych: worsening mood Endocrine: polydipsia Heme: bleeding Allergy: hayfever  GEN: nad, alert and oriented HEENT: mucous membranes moist NECK: supple w/o LA CV: rrr. PULM: ctab, no inc wob ABD: soft, +bs EXT: no edema SKIN: no acute rash  Diabetic foot exam: Normal inspection No skin breakdown No calluses  Normal DP pulses Normal sensation to light touch and monofilament Nails normal

## 2019-01-13 NOTE — Assessment & Plan Note (Signed)
He is now willing to start insulin.  He will come back with his needles and insulin pens and I will instruct him on use.  We will go from there.  Discussed.

## 2019-01-13 NOTE — Assessment & Plan Note (Signed)
Living will d/w pt.  Wife designated if patient were incapacitated.   ?

## 2019-01-13 NOTE — Assessment & Plan Note (Signed)
Triglyceride elevation likely related to sugar elevation.  We need to address his sugar first.  No change in meds at this point.  I will update cardiology in the meantime.

## 2019-01-13 NOTE — Assessment & Plan Note (Signed)
Tetanus 2015  Flu shot ~09/2018.  PNA prev done.  Shingles d/w pt.  Prostate cancer screening and PSA options (with potential risks and benefits of testing vs not testing) were discussed along with recent recs/guidelines. He declined testing PSA at this point.  Colonoscopy 2011  Living will d/w pt. Wife designated if patient were incapacitated.  Diet and exercise d/w pt. Encouraged on both.

## 2019-01-21 ENCOUNTER — Ambulatory Visit (INDEPENDENT_AMBULATORY_CARE_PROVIDER_SITE_OTHER): Payer: 59 | Admitting: Family Medicine

## 2019-01-21 ENCOUNTER — Encounter: Payer: Self-pay | Admitting: Family Medicine

## 2019-01-21 DIAGNOSIS — E1149 Type 2 diabetes mellitus with other diabetic neurological complication: Secondary | ICD-10-CM | POA: Diagnosis not present

## 2019-01-21 NOTE — Patient Instructions (Signed)
1 shot a day, at night.   Start with 5 units a day.   Check your sugar in the AM.  If your sugar is > 160, then add 1 unit to the next dose.  If your sugar is <120, take away 1 unit.  If 120-160, then no change in dose.    Update me about your sugar readings and dose in about 7-10 days, sooner if needed.   Take care.  Glad to see you.   Recheck in 3 months.  Labs at the visit.

## 2019-01-22 DIAGNOSIS — L820 Inflamed seborrheic keratosis: Secondary | ICD-10-CM | POA: Diagnosis not present

## 2019-01-22 DIAGNOSIS — D225 Melanocytic nevi of trunk: Secondary | ICD-10-CM | POA: Diagnosis not present

## 2019-01-22 DIAGNOSIS — L821 Other seborrheic keratosis: Secondary | ICD-10-CM | POA: Diagnosis not present

## 2019-01-22 DIAGNOSIS — X32XXXA Exposure to sunlight, initial encounter: Secondary | ICD-10-CM | POA: Diagnosis not present

## 2019-01-22 DIAGNOSIS — L570 Actinic keratosis: Secondary | ICD-10-CM | POA: Diagnosis not present

## 2019-01-24 NOTE — Progress Notes (Signed)
DMs.  D/w pt about options and prev labs.  Here for insulin start/instruction/teaching.  See plan.   Meds, vitals, and allergies reviewed.   ROS: Per HPI unless specifically indicated in ROS section   nad Remainder of exam deferred.

## 2019-01-24 NOTE — Assessment & Plan Note (Signed)
Options for diabetes treatment discussed with patient.  Insulin start discussed.  Handout given to patient, explained.  Use of insulin pen demonstrated.  All questions answered.  He was able to successfully give himself 5 units using aseptic technique without complication.  He will start with 1 injection/day, in the evenings.  Start with 5 units a day.   He will check his sugar in the AM.  If AM sugar is > 160, then add 1 unit to the next dose.  If AM sugar is <120, then decrease 1 unit.  If 120-160, then no change in dose.    He will update me about his sugar readings and dose in about 7-10 days, sooner if needed.    Recheck in 3 months.  Labs at the visit.  He agrees with plan.  >25 minutes spent in face to face time with patient, >50% spent in counselling or coordination of care, demonstrating insulin pen use, giving instruction, etc.

## 2019-02-12 ENCOUNTER — Encounter: Payer: Self-pay | Admitting: Family Medicine

## 2019-02-23 ENCOUNTER — Encounter: Payer: Self-pay | Admitting: Family Medicine

## 2019-02-23 ENCOUNTER — Other Ambulatory Visit: Payer: Self-pay

## 2019-02-23 ENCOUNTER — Other Ambulatory Visit: Payer: Self-pay | Admitting: *Deleted

## 2019-02-23 ENCOUNTER — Other Ambulatory Visit: Payer: Self-pay | Admitting: Family Medicine

## 2019-02-23 MED ORDER — GLUCOSE BLOOD VI STRP: ORAL_STRIP | 3 refills | Status: DC

## 2019-02-23 NOTE — Telephone Encounter (Signed)
Spoke with patient. He wanted to get his Verio strips sent to [facility] instead. RX re sent.

## 2019-03-15 ENCOUNTER — Other Ambulatory Visit: Payer: Self-pay | Admitting: Family Medicine

## 2019-04-01 ENCOUNTER — Encounter: Payer: Self-pay | Admitting: Family Medicine

## 2019-04-01 MED ORDER — BASAGLAR KWIKPEN 100 UNIT/ML ~~LOC~~ SOPN: 40.0000 [IU] | PEN_INJECTOR | Freq: Every day | SUBCUTANEOUS | 1 refills | Status: DC

## 2019-04-02 MED ORDER — BASAGLAR KWIKPEN 100 UNIT/ML ~~LOC~~ SOPN: 40.0000 [IU] | PEN_INJECTOR | Freq: Every day | SUBCUTANEOUS | 1 refills | Status: DC

## 2019-04-02 NOTE — Addendum Note (Signed)
Addended by: Vanwieren Kitten on: 04/02/2019 08:26 AM   Modules accepted: Orders

## 2019-04-15 ENCOUNTER — Telehealth: Payer: Self-pay

## 2019-04-15 NOTE — Telephone Encounter (Signed)
Left detailed VM w COVID screen and curbside info   

## 2019-04-19 ENCOUNTER — Other Ambulatory Visit: Payer: Self-pay | Admitting: Family Medicine

## 2019-04-19 ENCOUNTER — Other Ambulatory Visit (INDEPENDENT_AMBULATORY_CARE_PROVIDER_SITE_OTHER): Payer: 59

## 2019-04-19 DIAGNOSIS — E1149 Type 2 diabetes mellitus with other diabetic neurological complication: Secondary | ICD-10-CM | POA: Diagnosis not present

## 2019-04-19 LAB — HEMOGLOBIN A1C: Hgb A1c MFr Bld: 9.1 % — ABNORMAL HIGH (ref 4.6–6.5)

## 2019-04-22 ENCOUNTER — Encounter: Payer: Self-pay | Admitting: Family Medicine

## 2019-04-22 ENCOUNTER — Ambulatory Visit (INDEPENDENT_AMBULATORY_CARE_PROVIDER_SITE_OTHER): Payer: 59 | Admitting: Family Medicine

## 2019-04-22 DIAGNOSIS — E1149 Type 2 diabetes mellitus with other diabetic neurological complication: Secondary | ICD-10-CM

## 2019-04-22 NOTE — Progress Notes (Signed)
Virtual visit completed through WebEx or similar program Patient location: home  Provider location: [place] at [facility], office   Limitations and rationale for visit method d/w patient.  Patient agreed to proceed.    CC: DM  HPI:  Using medications without difficulties: taking 43 units.  No ADE on med.   Hypoglycemic episodes:no Hyperglycemic episodes: no Feet problems: at baseline, still with some pain from neuropathy, with tingling.  No ulceration.   Blood Sugars averaging: 120-150s eye exam within last year: yes A1c from 10.7 down to 9.1.    He is working on diet.    He has some B knee pain.  He is working on [employer] production planner.  He doesn't have joint pain o/w.  We talked about potentially xraying his knees at follow up if needed.  He wanted to defer at this point and he will update me as needed.  Meds and allergies reviewed.   ROS: Per HPI unless specifically indicated in ROS section   NAD Speech wnl  A/P:  DM2.  A1c from 10.7 down to 9.1.  No change in meds.  Plan on f/u in 3 months. A1c may continue to decrease with improved sugars.  He is working on diet and exercise.

## 2019-04-24 ENCOUNTER — Other Ambulatory Visit: Payer: Self-pay | Admitting: Family Medicine

## 2019-04-26 NOTE — Assessment & Plan Note (Signed)
DM2.  A1c from 10.7 down to 9.1.  No change in meds.  Plan on f/u in 3 months. A1c may continue to decrease with improved sugars.  He is working on diet and exercise.   He will update me about his knees/knee pain as needed.  See above.

## 2019-05-13 ENCOUNTER — Encounter: Payer: Self-pay | Admitting: Family Medicine

## 2019-05-14 ENCOUNTER — Other Ambulatory Visit: Payer: Self-pay | Admitting: Family Medicine

## 2019-05-14 ENCOUNTER — Telehealth: Payer: Self-pay

## 2019-05-14 MED ORDER — BASAGLAR KWIKPEN 100 UNIT/ML ~~LOC~~ SOPN: 40.0000 [IU] | PEN_INJECTOR | Freq: Every day | SUBCUTANEOUS | 1 refills | Status: DC

## 2019-05-14 MED ORDER — BASAGLAR KWIKPEN 100 UNIT/ML ~~LOC~~ SOPN: PEN_INJECTOR | SUBCUTANEOUS | 2 refills | Status: DC

## 2019-05-14 NOTE — Telephone Encounter (Signed)
Left message for patient and also sent mychart message.

## 2019-05-14 NOTE — Telephone Encounter (Signed)
Basaglar pen/insulin is needing a PA. Submitted this now on Cover my meds and awaiting reply. Before submitting PA infomration came up on this stating there is a 34% chance that this medication would be approved and to discuss alternatives to call [phone]. I will try and call but wanted to get this information. I did submit PA still to try.

## 2019-05-14 NOTE — Telephone Encounter (Signed)
Spoke with patient and pharmacy. Basaglar pen does not need a PA for this medication but pharmacy said they needed to clarify units of daily use. Patient states right now using 49 units once daily and will probably go up to 50 units daily. Spoke with Dr. Damita Dunnings and new RX sent in with directions to use up to 60 units daily. Left message for patient letting him know this and spoke with [facility] pharmacist to let them know to fill the new RX.

## 2019-05-14 NOTE — Telephone Encounter (Signed)
Thanks for checking on this, please update patient.  Thanks.

## 2019-05-14 NOTE — Addendum Note (Signed)
Addended by: Kris Mouton on: 05/14/2019 03:35 PM   Modules accepted: Orders

## 2019-06-08 ENCOUNTER — Other Ambulatory Visit: Payer: Self-pay | Admitting: Cardiology

## 2019-06-25 ENCOUNTER — Other Ambulatory Visit: Payer: Self-pay | Admitting: Family Medicine

## 2019-07-19 ENCOUNTER — Telehealth: Payer: Self-pay

## 2019-07-19 NOTE — Telephone Encounter (Signed)
Left detailed VM w COVID screen and back door lab info   

## 2019-07-20 ENCOUNTER — Other Ambulatory Visit: Payer: Self-pay | Admitting: Family Medicine

## 2019-07-20 DIAGNOSIS — E1149 Type 2 diabetes mellitus with other diabetic neurological complication: Secondary | ICD-10-CM

## 2019-07-21 ENCOUNTER — Other Ambulatory Visit: Payer: Self-pay

## 2019-07-21 ENCOUNTER — Other Ambulatory Visit (INDEPENDENT_AMBULATORY_CARE_PROVIDER_SITE_OTHER): Payer: 59

## 2019-07-21 DIAGNOSIS — E1149 Type 2 diabetes mellitus with other diabetic neurological complication: Secondary | ICD-10-CM

## 2019-07-21 LAB — LIPID PANEL
Cholesterol: 162 mg/dL (ref 0–200)
HDL: 33.8 mg/dL — ABNORMAL LOW (ref >39.00)
NonHDL: 128.1
Total CHOL/HDL Ratio: 5
Triglycerides: 249 mg/dL — ABNORMAL HIGH (ref 0.0–149.0)
VLDL: 49.8 mg/dL — ABNORMAL HIGH (ref 0.0–40.0)

## 2019-07-21 LAB — HEMOGLOBIN A1C: Hgb A1c MFr Bld: 7.9 % — ABNORMAL HIGH (ref 4.6–6.5)

## 2019-07-21 LAB — LDL CHOLESTEROL, DIRECT: Direct LDL: 85 mg/dL

## 2019-07-26 ENCOUNTER — Other Ambulatory Visit: Payer: Self-pay

## 2019-07-26 ENCOUNTER — Ambulatory Visit (INDEPENDENT_AMBULATORY_CARE_PROVIDER_SITE_OTHER): Payer: 59 | Admitting: Family Medicine

## 2019-07-26 ENCOUNTER — Encounter: Payer: Self-pay | Admitting: Family Medicine

## 2019-07-26 DIAGNOSIS — E1149 Type 2 diabetes mellitus with other diabetic neurological complication: Secondary | ICD-10-CM

## 2019-07-26 MED ORDER — NITROSTAT 0.4 MG SL SUBL: 0.4000 mg | SUBLINGUAL_TABLET | SUBLINGUAL | 12 refills | Status: DC | PRN

## 2019-07-26 MED ORDER — BASAGLAR KWIKPEN 100 UNIT/ML ~~LOC~~ SOPN: PEN_INJECTOR | SUBCUTANEOUS | Status: DC

## 2019-07-26 NOTE — Patient Instructions (Addendum)
You may need to taper your insulin down to 56-58 to limit lower sugars.  If you keep having trouble with lows then let me know.   It is reasonable to cut a small amount of calories in the long run (100 cal a day).  That would be 5% of 2000 cal per day.  Take care.  Glad to see you.  Recheck in about 3-4 months, A1c at the visit.

## 2019-07-26 NOTE — Progress Notes (Addendum)
Diabetes:  Using medications without difficulties: yes Hypoglycemic episodes: lowest was ~90 but felt sx with that Hyperglycemic episodes: no Feet problems: still with tingling and pain at baseline.  Blood Sugars averaging: 120-160s recently  eye exam within last year: Injecting 60-62 units insulin per day.   On 1000mg  metformin a day.  A1c lower than prev.  A1c 9.1---->7.9.  D/w pt.   He is working on diet.  Lipids clearly better with lower TG.    L medial knee is episodically painful.  He has tried rotating/new shoes.    He doesn't have chest pain, no exertional sx.    Meds, vitals, and allergies reviewed.   ROS: Per HPI unless specifically indicated in ROS section   GEN: nad, alert and oriented HEENT: ncat NECK: supple w/o LA CV: rrr. PULM: ctab, no inc wob ABD: soft, +bs EXT: no edema SKIN: no acute rash

## 2019-07-27 ENCOUNTER — Telehealth: Payer: Self-pay

## 2019-07-27 MED ORDER — NITROGLYCERIN 0.4 MG SL SUBL: 0.4000 mg | SUBLINGUAL_TABLET | SUBLINGUAL | 12 refills | Status: DC | PRN

## 2019-07-27 NOTE — Telephone Encounter (Signed)
Yes, please make the change so it doesn't have to be DAW.  No change in sig o/w.  Thanks.

## 2019-07-27 NOTE — Telephone Encounter (Signed)
[facility] notified as instructed; spoke with Thomasena Edis and he voiced understanding. Med list updated.

## 2019-07-27 NOTE — Telephone Encounter (Signed)
[facility] left v/m that they received the rx for nitro stat DAW and they cannot get the name brand nitrostat 0.4 mg sublingual from there supplier and wants to know if generic would be OK? Please advise.

## 2019-07-28 NOTE — Assessment & Plan Note (Signed)
Injecting 60-62 units insulin per day.   On 1000mg  metformin a day.  A1c lower than prev.  A1c 9.1---->7.9.  D/w pt.   He is working on diet.  Lipids clearly better with lower TG.    He may need to taper his insulin down to 56-58 to limit lower sugars.  If still having trouble with lows then he can let me know.   It is reasonable to cut a small amount of calories in the long run (100 cal a day).  That would be 5% of 2000 cal per day.  Recheck in about 3-4 months, A1c at the visit.

## 2019-07-30 ENCOUNTER — Telehealth: Payer: Self-pay | Admitting: Family Medicine

## 2019-07-30 ENCOUNTER — Encounter: Payer: Self-pay | Admitting: Family Medicine

## 2019-07-30 ENCOUNTER — Other Ambulatory Visit: Payer: Self-pay

## 2019-07-30 DIAGNOSIS — Z20822 Contact with and (suspected) exposure to covid-19: Secondary | ICD-10-CM

## 2019-07-30 NOTE — Telephone Encounter (Addendum)
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 No mask were worn; pt has no covid symptoms,no travel and does not need to be tested and quarantine.  Please see phone note from Dr Damita Dunnings 07/30/19.

## 2019-07-30 NOTE — Telephone Encounter (Signed)
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 No mask were worn; pt has no covid symptoms,no travel and does pt need to be tested and quarantine. I spoke with pt and advised per Dr Damita Dunnings that pt does need covid testing and [facility] for testing is open today until 3:30; pt said he would go now for testing; this was at [place].. (I am late entering info due to urgent matter with another pt in triage). Pt voiced understanding about quarantining also. FYI to Dr Damita Dunnings.

## 2019-07-30 NOTE — Telephone Encounter (Signed)
If he had an exposure then I would get tested and quarantine until results are back, regardless of symptoms.  Thanks.

## 2019-07-30 NOTE — Telephone Encounter (Signed)
Thanks

## 2019-07-31 LAB — NOVEL CORONAVIRUS, NAA: SARS-CoV-2, NAA: NOT DETECTED

## 2019-08-23 ENCOUNTER — Telehealth: Payer: Self-pay | Admitting: Cardiology

## 2019-08-23 NOTE — Telephone Encounter (Signed)
LVM for patient to call and schedule virtual visit for his 12 month followup.

## 2019-09-01 ENCOUNTER — Other Ambulatory Visit: Payer: Self-pay | Admitting: Family Medicine

## 2019-09-02 ENCOUNTER — Other Ambulatory Visit: Payer: Self-pay | Admitting: Cardiology

## 2019-09-10 ENCOUNTER — Other Ambulatory Visit: Payer: Self-pay | Admitting: Family Medicine

## 2019-09-16 ENCOUNTER — Other Ambulatory Visit: Payer: Self-pay | Admitting: Cardiology

## 2019-09-20 IMAGING — CR DG CHEST 2V
2 series · 2 of 2 positions shown · non-contrast
Comparison: Portable chest x-ray of 12/03/2013

CLINICAL DATA: Short of breath, weakness, chest pain for a week

EXAM:
CHEST - 2 VIEW

[chest pa]
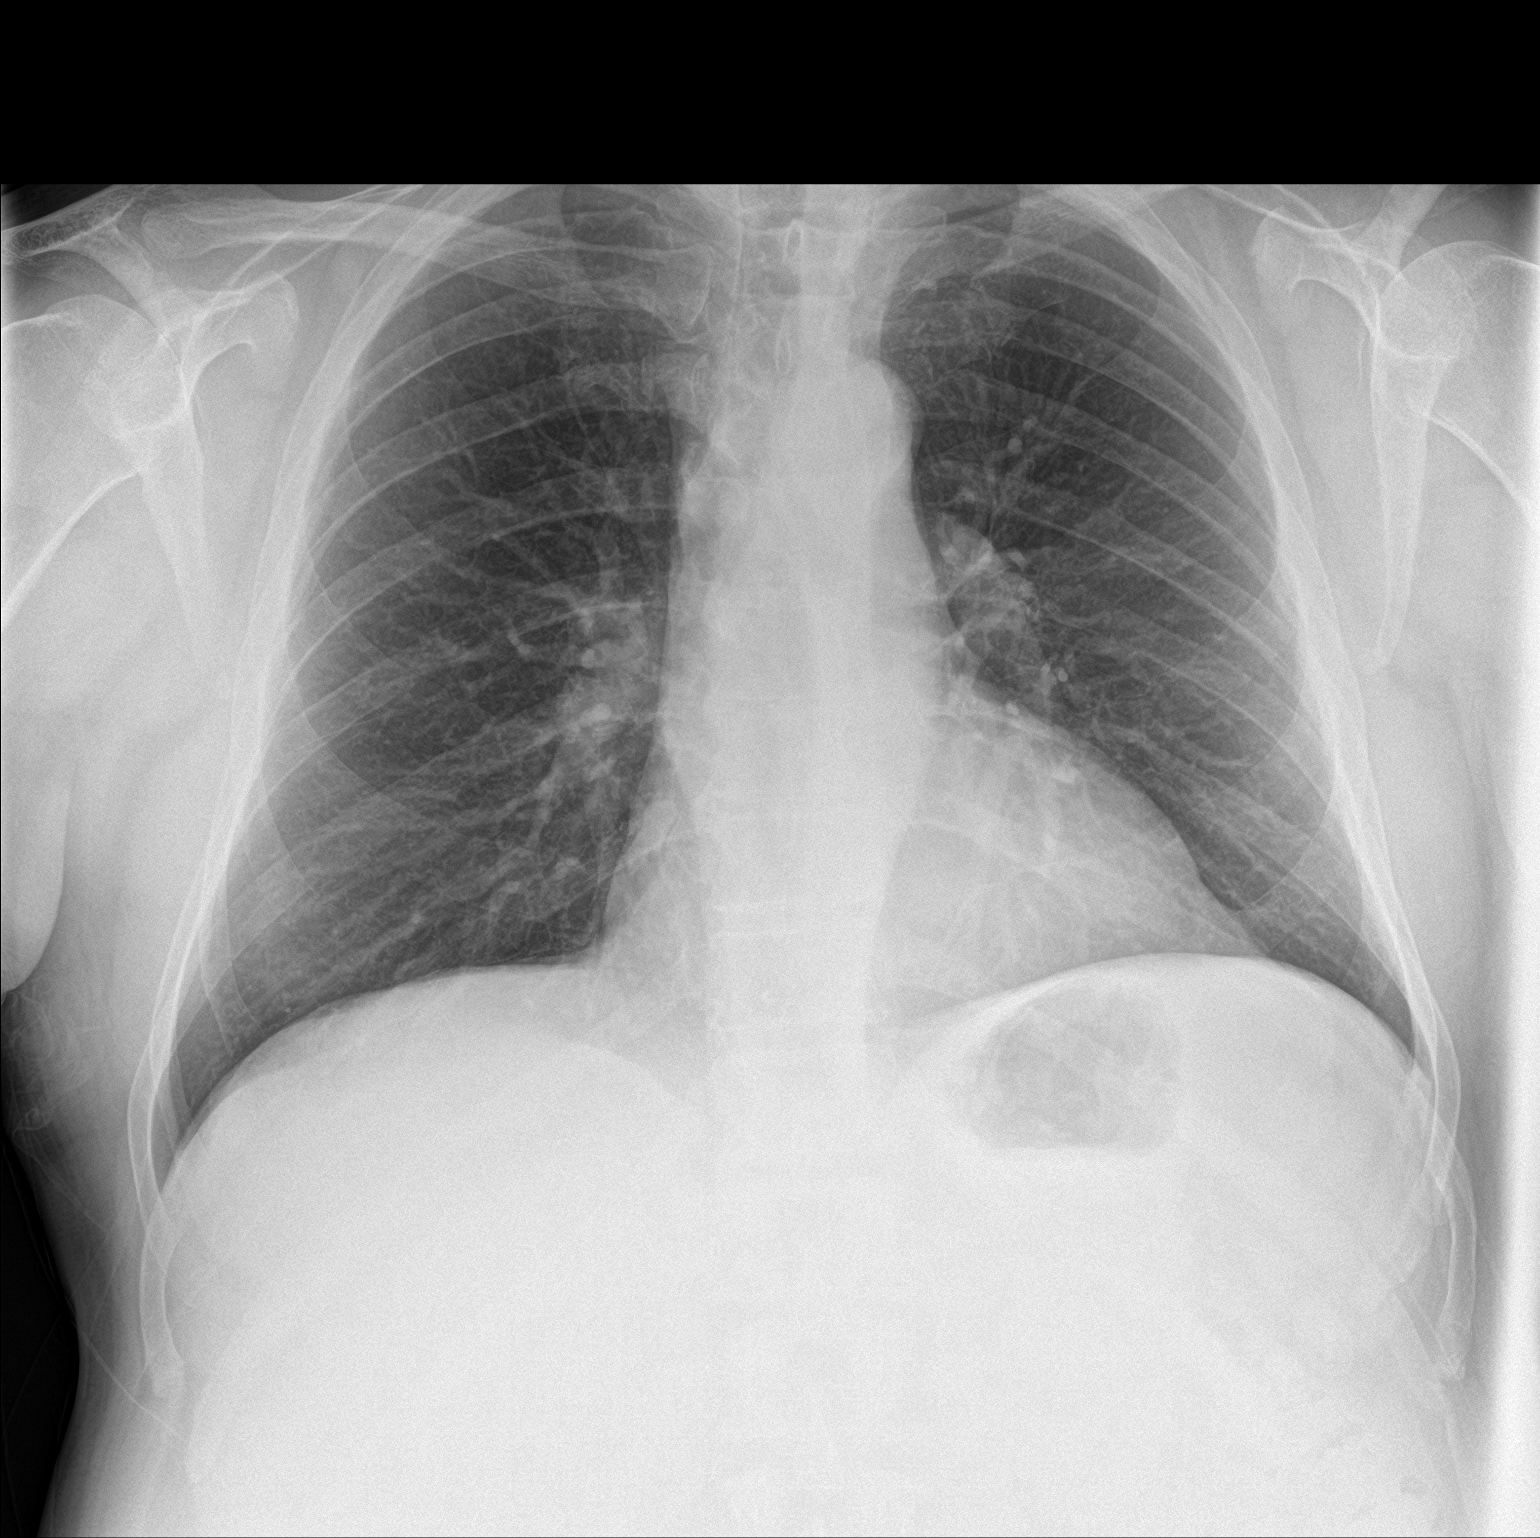

[chest lat]
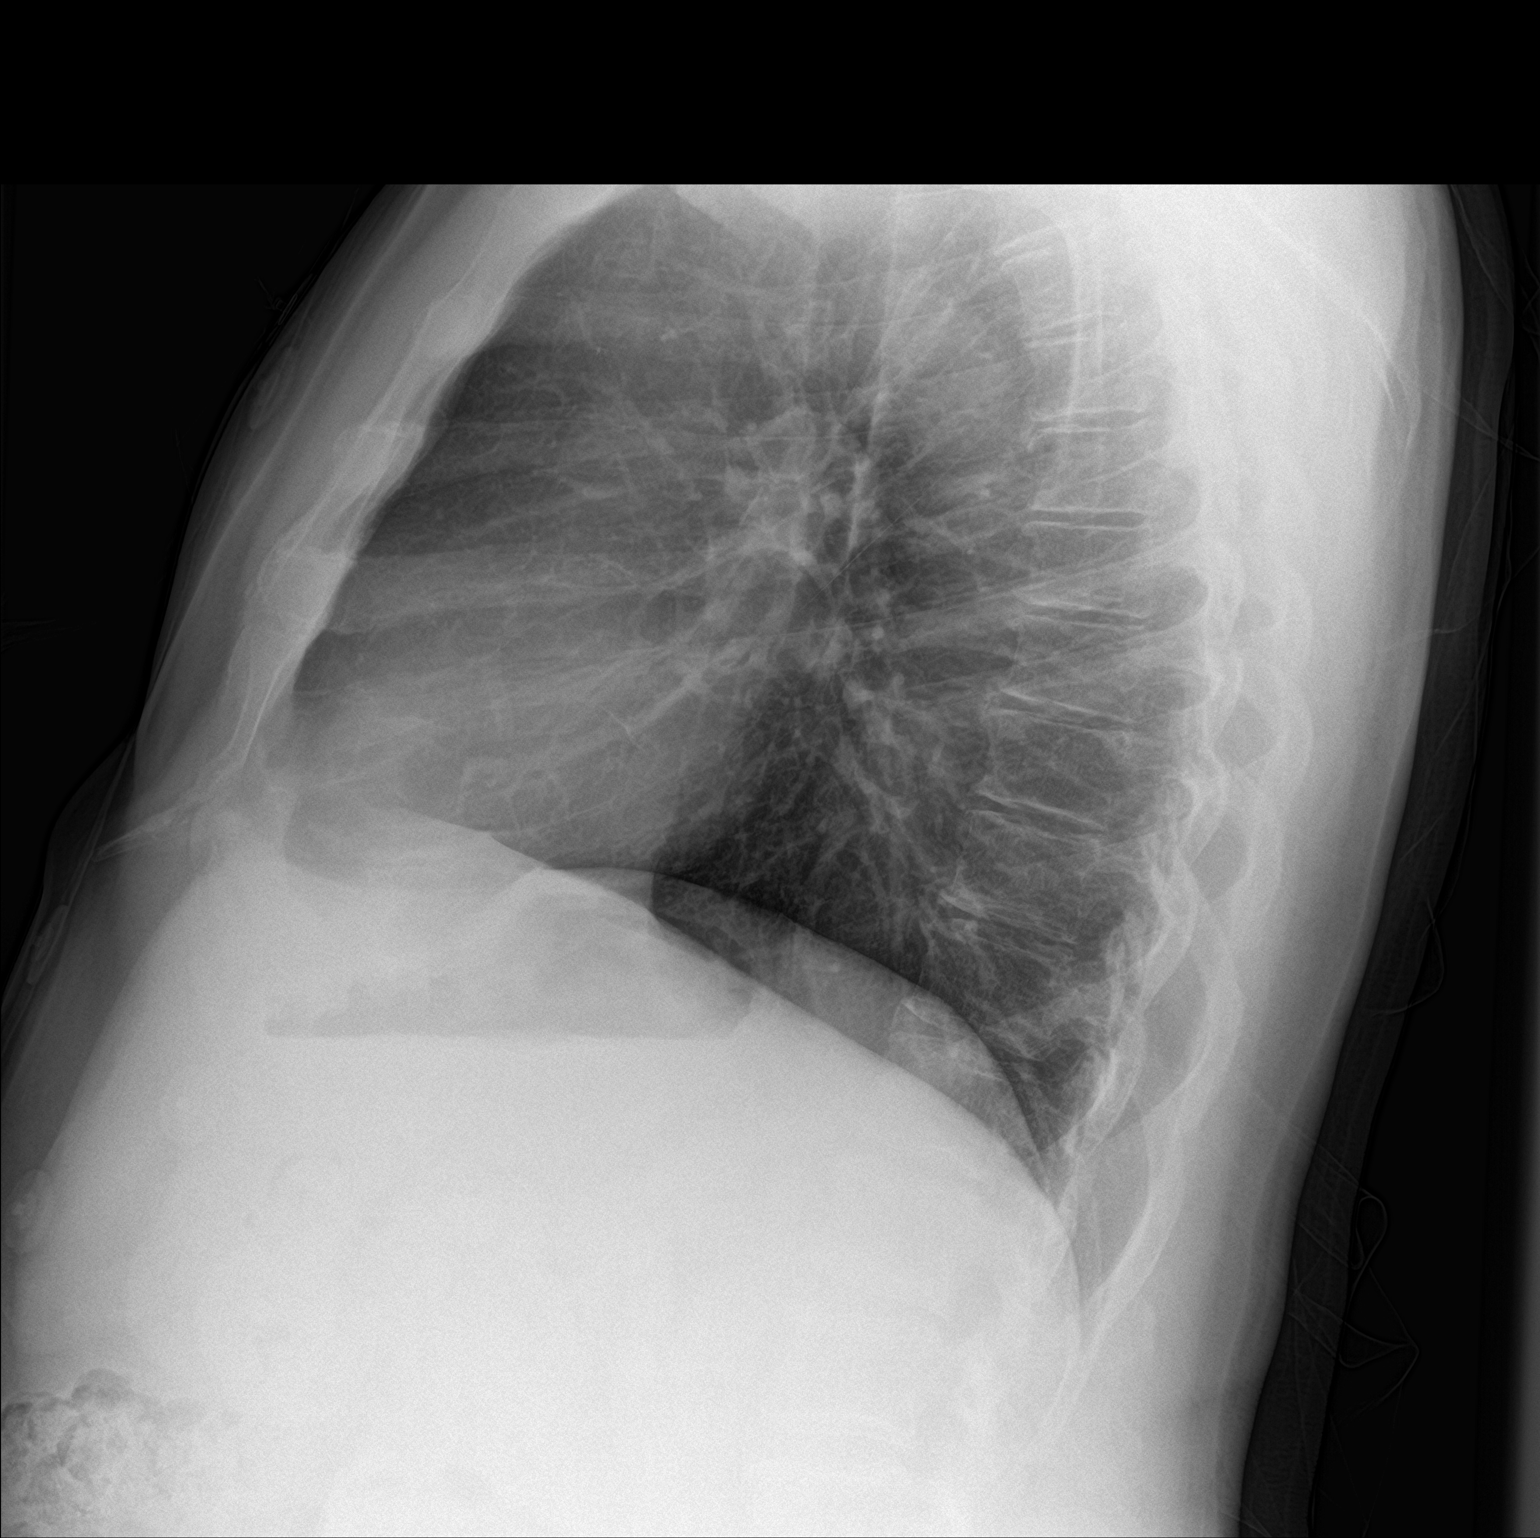

[2 of 2 positions shown; findings below may reference images not displayed]

FINDINGS: No active infiltrate or effusion is seen. Mediastinal and hilar
contours are unremarkable. The heart is within normal limits in
size. No acute bony abnormality is seen.
IMPRESSION: No active cardiopulmonary disease.

## 2019-10-07 ENCOUNTER — Other Ambulatory Visit: Payer: Self-pay | Admitting: Family Medicine

## 2019-10-17 ENCOUNTER — Other Ambulatory Visit: Payer: Self-pay | Admitting: Cardiology

## 2019-10-18 ENCOUNTER — Encounter: Payer: Self-pay | Admitting: Family Medicine

## 2019-10-20 ENCOUNTER — Other Ambulatory Visit: Payer: Self-pay | Admitting: Cardiology

## 2019-10-21 NOTE — Telephone Encounter (Signed)
Rx(s) sent to pharmacy electronically.  

## 2019-11-05 ENCOUNTER — Encounter: Payer: Self-pay | Admitting: Family Medicine

## 2019-11-05 ENCOUNTER — Ambulatory Visit (INDEPENDENT_AMBULATORY_CARE_PROVIDER_SITE_OTHER): Payer: 59 | Admitting: Family Medicine

## 2019-11-05 ENCOUNTER — Ambulatory Visit (INDEPENDENT_AMBULATORY_CARE_PROVIDER_SITE_OTHER)
Admission: RE | Admit: 2019-11-05 | Discharge: 2019-11-05 | Disposition: A | Discharge status: Home / Self Care | Payer: 59 | Source: Ambulatory Visit | Attending: Family Medicine | Admitting: Family Medicine

## 2019-11-05 ENCOUNTER — Other Ambulatory Visit: Payer: Self-pay

## 2019-11-05 VITALS — BP 122/64 | HR 78 | Temp 96.5°F | Ht 70.5 in | Wt 223.5 lb

## 2019-11-05 DIAGNOSIS — E119 Type 2 diabetes mellitus without complications: Secondary | ICD-10-CM | POA: Diagnosis not present

## 2019-11-05 DIAGNOSIS — M25562 Pain in left knee: Secondary | ICD-10-CM

## 2019-11-05 DIAGNOSIS — E1149 Type 2 diabetes mellitus with other diabetic neurological complication: Secondary | ICD-10-CM | POA: Diagnosis not present

## 2019-11-05 LAB — POCT GLYCOSYLATED HEMOGLOBIN (HGB A1C): Hemoglobin A1C: 7.7 % — AB (ref 4.0–5.6)

## 2019-11-05 MED ORDER — LANTUS SOLOSTAR 100 UNIT/ML ~~LOC~~ SOPN: PEN_INJECTOR | SUBCUTANEOUS | 99 refills | Status: DC

## 2019-11-05 NOTE — Patient Instructions (Signed)
Go to the lab on the way out.  We'll contact you with your xray report. Recheck in about 3-4 months at a physical.  Change from basaglar to lantus.   If the cramps don't get better with increased water intake, then let me know.  Take care.  Glad to see you.

## 2019-11-05 NOTE — Progress Notes (Signed)
This visit occurred during the SARS-CoV-2 public health emergency.  Safety protocols were in place, including screening questions prior to the visit, additional usage of staff PPE, and extensive cleaning of exam room while observing appropriate contact time as indicated for disinfecting solutions.   Diabetes:  Using medications without difficulties: yes, taking 56-58 units per day.   Hypoglycemic episodes: no Hyperglycemic episodes:no Feet problems: some tingling at baseline.   Blood Sugars averaging: 120-170s but variable.   eye exam within last year:yes A1c down to 7.7.  D/w pt.   We talked about inc water, less salt and less soda.   lantus will be covered, but not basaglar.  Discussed with patient.  Med change done.  See orders.  He has some leg cramping but not with exertion.  His knee pain is better recently but not resolved.  L medial knee pain.  Discussed options.  See imaging.  Flu shot today.   Meds, vitals, and allergies reviewed.   ROS: Per HPI unless specifically indicated in ROS section   GEN: nad, alert and oriented HEENT: ncat NECK: supple w/o LA CV: rrr. PULM: ctab, no inc wob ABD: soft, +bs EXT: no edema SKIN: no acute rash L knee medially ttp but no crepitus on ROM.

## 2019-11-07 DIAGNOSIS — M25562 Pain in left knee: Secondary | ICD-10-CM | POA: Insufficient documentation

## 2019-11-07 NOTE — Assessment & Plan Note (Signed)
A1c down to 7.7. lantus will be covered, but not basaglar.  Discussed with patient.  Med change done.  See orders. This should be a one-to-one change, discussed with patient.  I would not make any other dosage adjustments at this point.  We may need to do that later on but I would make the brain change first.  He agrees.  See after visit summary.  I thanked him for his effort.

## 2019-11-07 NOTE — Assessment & Plan Note (Signed)
Likely arthritis.  See notes on imaging.  He agrees to plan.

## 2019-11-21 ENCOUNTER — Other Ambulatory Visit: Payer: Self-pay | Admitting: Cardiology

## 2019-11-21 ENCOUNTER — Encounter: Payer: Self-pay | Admitting: Family Medicine

## 2019-11-22 NOTE — Telephone Encounter (Signed)
Rx(s) sent to pharmacy electronically.  

## 2019-11-22 NOTE — Telephone Encounter (Signed)
Called patient and scheduled appointment for 11/23/2019. Offered appointment today with another provider but patient declined. No fever so far. FYI to Dr Damita Dunnings

## 2019-11-23 ENCOUNTER — Other Ambulatory Visit: Payer: Self-pay

## 2019-11-23 ENCOUNTER — Encounter: Payer: Self-pay | Admitting: Family Medicine

## 2019-11-23 ENCOUNTER — Ambulatory Visit (INDEPENDENT_AMBULATORY_CARE_PROVIDER_SITE_OTHER): Payer: 59 | Admitting: Family Medicine

## 2019-11-23 DIAGNOSIS — L089 Local infection of the skin and subcutaneous tissue, unspecified: Secondary | ICD-10-CM

## 2019-11-23 DIAGNOSIS — L723 Sebaceous cyst: Secondary | ICD-10-CM | POA: Diagnosis not present

## 2019-11-23 NOTE — Patient Instructions (Signed)
Pull the packing in 48 hours.  Update me as needed.  Wash with soapy water thereafter.  It may continue to drain some.  Take care.  Glad to see you.

## 2019-11-23 NOTE — Progress Notes (Signed)
This visit occurred during the SARS-CoV-2 public health emergency.  Safety protocols were in place, including screening questions prior to the visit, additional usage of staff PPE, and extensive cleaning of exam room while observing appropriate contact time as indicated for disinfecting solutions.  I&D  Meds, vitals, and allergies reviewed.   Indication: Infected sebaceous cyst left lower back.  Pt complaints of: erythema, pain, swelling  Location: Left lower back  Size: Approximately 2 cm  Informed consent obtained.  Pt aware of risks not limited to but including infection, bleeding, damage to near by organs.  Prep: etoh/betadine  Anesthesia: 1%lidocaine with epi, good effect  Incision made with #11 blade  Wound explored and loculations removed  Wound packed with iodoform gauze  Tolerated well  Routine postprocedure instructions d/w pt- remove packing in 48h, keep area clean and bandaged, follow up if concerns/spreading erythema/pain.

## 2019-11-24 NOTE — Assessment & Plan Note (Signed)
No complications. Tolerated well Routine postprocedure instructions d/w pt- remove packing in 48h, keep area clean and bandaged, follow up if concerns/spreading erythema/pain. See after visit summary.  He agrees.

## 2019-12-06 ENCOUNTER — Telehealth: Payer: Self-pay | Admitting: Cardiology

## 2019-12-06 ENCOUNTER — Other Ambulatory Visit: Payer: Self-pay | Admitting: Cardiology

## 2019-12-06 ENCOUNTER — Telehealth: Payer: 59 | Admitting: Cardiology

## 2019-12-06 NOTE — Telephone Encounter (Signed)
The patient has been made aware that per Dr. Allison Quarry nurse, that a virtual appointment would be okay for today. The patient will have a blood pressure and current weight available.

## 2019-12-06 NOTE — Telephone Encounter (Signed)
Patient was wondering if his appt today with Dr. Ellyn Hack at 3:40pm could be virtual. He has a job related conflict and would not be able to make it to his appt today.

## 2019-12-09 ENCOUNTER — Encounter: Payer: Self-pay | Admitting: Cardiology

## 2019-12-09 ENCOUNTER — Telehealth (INDEPENDENT_AMBULATORY_CARE_PROVIDER_SITE_OTHER): Payer: 59 | Admitting: Cardiology

## 2019-12-09 ENCOUNTER — Telehealth: Payer: Self-pay

## 2019-12-09 VITALS — BP 125/84 | HR 71 | Ht 70.0 in | Wt 218.0 lb

## 2019-12-09 DIAGNOSIS — I2510 Atherosclerotic heart disease of native coronary artery without angina pectoris: Secondary | ICD-10-CM

## 2019-12-09 DIAGNOSIS — E782 Mixed hyperlipidemia: Secondary | ICD-10-CM

## 2019-12-09 DIAGNOSIS — I2119 ST elevation (STEMI) myocardial infarction involving other coronary artery of inferior wall: Secondary | ICD-10-CM | POA: Diagnosis not present

## 2019-12-09 DIAGNOSIS — Z955 Presence of coronary angioplasty implant and graft: Secondary | ICD-10-CM

## 2019-12-09 DIAGNOSIS — I10 Essential (primary) hypertension: Secondary | ICD-10-CM | POA: Diagnosis not present

## 2019-12-09 MED ORDER — LISINOPRIL 2.5 MG PO TABS: 2.5000 mg | ORAL_TABLET | Freq: Every day | ORAL | 3 refills | Status: DC

## 2019-12-09 MED ORDER — ROSUVASTATIN CALCIUM 40 MG PO TABS: 40.0000 mg | ORAL_TABLET | Freq: Every day | ORAL | 3 refills | Status: DC

## 2019-12-09 MED ORDER — FENOFIBRATE 160 MG PO TABS: 160.0000 mg | ORAL_TABLET | Freq: Every day | ORAL | 3 refills | Status: DC

## 2019-12-09 MED ORDER — TICAGRELOR 60 MG PO TABS: 60.0000 mg | ORAL_TABLET | Freq: Two times a day (BID) | ORAL | 3 refills | Status: DC

## 2019-12-09 MED ORDER — METOPROLOL TARTRATE 25 MG PO TABS: 25.0000 mg | ORAL_TABLET | Freq: Two times a day (BID) | ORAL | 3 refills | Status: DC

## 2019-12-09 MED ORDER — ICOSAPENT ETHYL 1 G PO CAPS: 2.0000 g | ORAL_CAPSULE | Freq: Two times a day (BID) | ORAL | 3 refills | Status: DC

## 2019-12-09 NOTE — Assessment & Plan Note (Signed)
   Continue maintenance dose Brilinta without aspirin.  Okay to hold Brilinta 5-7 days preop for surgeries or procedures.

## 2019-12-09 NOTE — Progress Notes (Addendum)
Virtual Visit via Telephone Note   This visit type was conducted due to national recommendations for restrictions regarding the COVID-19 Pandemic (e.g. social distancing) in an effort to limit this patient's exposure and mitigate transmission in our community.  Due to his co-morbid illnesses, this patient is at least at moderate risk for complications without adequate follow up.  This format is felt to be most appropriate for this patient at this time.  The patient did not have access to video technology/had technical difficulties with video requiring transitioning to audio format only (telephone).  All issues noted in this document were discussed and addressed.  No physical exam could be performed with this format.  Please refer to the patient's chart for his  consent to telehealth for [facility].   Patient has given verbal permission to conduct this visit via virtual appointment and to bill insurance 12/09/2019 11:20 AM     Evaluation Performed:  Follow-up visit  Date:  12/09/2019   ID:  [name] [name], DOB 1961-07-12, MRN [id number]  Patient Location: Home Provider Location: Home  PCP:  Tonia Ghent, MD  Cardiologist:  No primary care provider on file.  Electrophysiologist:  None   Chief Complaint:   Chief Complaint  Patient presents with  . Follow-up    Delayed annual  . Coronary Artery Disease    No chest pain  . Hyperlipidemia    Notable provement, but still has room    History of Present Illness:    [name] [name] is a 59 y.o. male with PMH notable for delayed 1 year follow-up who presents via audio/video conferencing for a telehealth visit today.  [name] [name] was last seen August 13, 2018 by [name], NP following cardiac catheterization.  Was doing well post cath (what appeared to be a false positive stress test).  He had just seen in CVRR for assistance with his hypertriglyceridemia.  Was started on Crestor 40 mg daily plus  Vascepa 1 g twice daily.  Brilinta reduced to 60 mg twice daily  Hospitalizations:  n/a.  Recent - Interim CV studies:   The following studies were reviewed today: . Cath-PCI July 16, 2018: Widely patent stent in dominant RCA.  Tortuous/widely patent wraparound LAD.  Normal/tortuous LCx.  EF 55%.  Inferior HK   -Recommend no further nuclear med studies  Inerval History   [name] [name]  Is doing well - feels better now than he has in a while.  Just wants to try to loose weight.  Active @ work - walks around [employer].  No routine exercise.  Cardiovascular ROS: no chest pain or dyspnea on exertion negative for - edema, irregular heartbeat, murmur, orthopnea, palpitations, paroxysmal nocturnal dyspnea, rapid heart rate, shortness of breath or Syncope/near syncope, TIA/amaurosis fugax, claudication   ROS:  Please see the history of present illness.    The patient does not have symptoms concerning for COVID-19 infection (fever, chills, cough, or new shortness of breath).  Review of Systems  Constitutional: Negative for malaise/fatigue.  HENT: Negative for congestion and nosebleeds.   Respiratory: Negative for cough and shortness of breath.   Cardiovascular: Negative for claudication.  Gastrointestinal: Negative for blood in stool and melena.  Genitourinary: Negative for hematuria.  Musculoskeletal: Negative for joint pain (mild OA pain).  Neurological: Negative for dizziness and focal weakness.  Endo/Heme/Allergies: Positive for environmental allergies.  Psychiatric/Behavioral: Negative for memory loss. The patient is not nervous/anxious and does not have insomnia.  All other systems reviewed and are negative.  The patient is practicing social distancing.  Past Medical History:  Diagnosis Date  . Allergic rhinitis   . CAD S/P percutaneous coronary angioplasty 12/03/2013   PCI-RCA - Xience Alpine DES 3.0 mm x 18 mm --> 3.5 mm; Echo 1/'15" EF 45-50%, mild C LVH,  mild Inf HK, Gr 1 DD  . Diabetes mellitus    type2  . Fracture of right hand 1983  . Hyperlipidemia   . Hypertension   . Nocturia   . Paresthesia    tingling in feet  . STEMI (ST elevation myocardial infarction) of inf. wall 12/03/2013   Past Surgical History:  Procedure Laterality Date  . KNEE SURGERY     B arthroscopic  . LEFT HEART CATH AND CORONARY ANGIOGRAPHY N/A 07/16/2018   Procedure: LEFT HEART CATH AND CORONARY ANGIOGRAPHY;  Surgeon: Belva Crome, MD;  Location: [facility];  Service: Cardiovascular;  Laterality: N/A;  . LEFT HEART CATHETERIZATION WITH CORONARY ANGIOGRAM N/A 12/03/2013   Procedure: LEFT HEART CATHETERIZATION WITH CORONARY ANGIOGRAM;  Surgeon: Leonie Man, MD;  Location: [facility] CATH LAB;  Service: Cardiovascular;  Laterality: N/A;  . NM MYOVIEW LTD  11/2015; 06/2018   a) Normal; b)  EF 51%.  Findings consistent with prior MI with peri-infarct ischemia (LOW RISK).  Small defect of mild severity in the basal inferior mid inferior location.  There is also horizontal ST segment depression in inferior leads.  Although low risk images, the EKG changes are concerning.  There is concern for possible multivessel CAD.  Marland Kitchen PERCUTANEOUS CORONARY STENT INTERVENTION (PCI-S)  12/03/2013   mRCA - Xience Alpine DES 3.0 mm x 18 mm --> 3.5 mm  . SEPTOPLASTY  1970   due to Fx Nose  . TRANSTHORACIC ECHOCARDIOGRAM  12/03/2013; 11/2014   a. Mild Conc LVH; EF 45-50%, Mild Inferior HK. Grade 1 DD;; b. EF 55-60%. No RWMA. Moderate RV dilation but normal function.     Current Meds  Medication Sig  . fenofibrate 160 MG tablet Take 1 tablet (160 mg total) by mouth daily. NEEDS APPOINTMENT FOR FUTURE REFILLS  . folic acid ([employer] FOLIC ACID) A999333 MCG tablet Take 1 tablet (400 mcg total) by mouth daily.  Marland Kitchen glipiZIDE (GLUCOTROL) 5 MG tablet TAKE 1 TABLET BY MOUTH  DAILY BEFORE BREAKFAST  . glucose blood (ONETOUCH VERIO) test strip Use as instructed to check blood sugar daily and as needed.   Diagnosis: E11.49  Non insulin dependent.  Marland Kitchen icosapent Ethyl (VASCEPA) 1 g capsule Take 2 capsules (2 g total) by mouth 2 (two) times daily.  . Insulin Glargine (LANTUS SOLOSTAR) 100 UNIT/ML Solostar Pen INJECT UP TO 60 UNITS SUBCUTANOUSLY ONCE DAILY  . Insulin Pen Needle (B-D UF [name] MINI PEN NEEDLES) 31G X 5 MM MISC USE AS DIRECTED WITH INSULIN PEN  . lisinopril (ZESTRIL) 2.5 MG tablet Take 1 tablet (2.5 mg total) by mouth daily. NEEDS APPOINTMENT FOR FUTURE REFILLS  . metFORMIN (GLUCOPHAGE) 500 MG tablet TAKE 1 TABLET BY MOUTH  TWICE DAILY WITH MEALS  . metoprolol tartrate (LOPRESSOR) 25 MG tablet Take 1 tablet (25 mg total) by mouth 2 (two) times daily.  Glory Rosebush DELICA LANCETS [id number] MISC Use as instructed to check blood sugar once daily or as needed.  Diagnosis:  E11.49  Non insulin dependent.  . rosuvastatin (CRESTOR) 40 MG tablet Take 1 tablet (40 mg total) by mouth daily. MUST KEEP APPOINTMENT 12/06/19 WITH DR Reggie Bise FOR FUTURE REFILLS  .  ticagrelor (BRILINTA) 60 MG TABS tablet Take 1 tablet (60 mg total) by mouth 2 (two) times daily. NEED OV FOR FUTURE REFILL  . vitamin B-12 (CYANOCOBALAMIN) 1000 MCG tablet Take 1 tablet (1,000 mcg total) by mouth daily.  . vitamin B-6 (PYRIDOXINE) 25 MG tablet Take 1 tablet (25 mg total) by mouth daily.  . [DISCONTINUED] fenofibrate 160 MG tablet Take 1 tablet (160 mg total) by mouth daily. NEEDS APPOINTMENT FOR FUTURE REFILLS  . [DISCONTINUED] lisinopril (ZESTRIL) 2.5 MG tablet Take 1 tablet (2.5 mg total) by mouth daily. NEEDS APPOINTMENT FOR FUTURE REFILLS  . [DISCONTINUED] metoprolol tartrate (LOPRESSOR) 25 MG tablet TAKE 1 TABLET BY MOUTH  TWICE DAILY  . [DISCONTINUED] rosuvastatin (CRESTOR) 40 MG tablet Take 1 tablet (40 mg total) by mouth daily. MUST KEEP APPOINTMENT 12/06/19 WITH DR Hakeen Shipes FOR FUTURE REFILLS  . [DISCONTINUED] ticagrelor (BRILINTA) 60 MG TABS tablet Take 1 tablet (60 mg total) by mouth 2 (two) times daily. NEED OV FOR FUTURE REFILL  .  [DISCONTINUED] VASCEPA 1 g CAPS TAKE 1 CAPSULE BY MOUTH  TWICE DAILY (TO REPLACE  KRILL OIL)     Allergies:   Metformin and related   Social History   Tobacco Use  . Smoking status: Former Smoker    Packs/day: 0.50    Years: 20.00    Pack years: 10.00    Types: Cigarettes    Quit date: 12/03/2013    Years since quitting: 6.0  . Smokeless tobacco: Never Used  Substance Use Topics  . Alcohol use: Yes    Comment: occ on weekends  . Drug use: No     Family Hx: The patient's family history includes Cancer in his father and mother; Diabetes in his father; Hearing loss in his brother; Heart disease in his paternal grandfather; Hyperlipidemia in his father; Hypertension in his father. There is no history of Colon cancer or Prostate cancer.   Labs/Other Tests and Data Reviewed:    EKG:  No ECG reviewed.  Recent Labs: 01/08/2019: ALT 29; BUN 14; Creatinine, Ser 0.85; Hemoglobin 14.1; Platelets 240.0; Potassium 4.3; Sodium 134; TSH 1.30  Lab Results  Component Value Date   HGBA1C 7.7 (A) 11/05/2019   Recent Lipid Panel Lab Results  Component Value Date/Time   CHOL 162 07/21/2019 08:06 AM   CHOL 212 (H) 01/22/2018 12:00 AM   TRIG 249.0 (H) 07/21/2019 08:06 AM   HDL 33.80 (L) 07/21/2019 08:06 AM   HDL 30 (L) 01/22/2018 12:00 AM   CHOLHDL 5 07/21/2019 08:06 AM   LDLCALC Comment 01/22/2018 12:00 AM   LDLDIRECT 85.0 07/21/2019 08:06 AM    Wt Readings from Last 3 Encounters:  12/09/19 218 lb (98.9 kg)  11/23/19 224 lb 6 oz (101.8 kg)  11/05/19 223 lb 8 oz (101.4 kg)     Objective:    Vital Signs:  BP 125/84   Pulse 71   Ht 5\' 10"  (1.778 m)   Wt 218 lb (98.9 kg)   BMI 31.28 kg/m   VITAL SIGNS:  reviewed GEN:  no acute distress RESPIRATORY:  Normal respiratory effort NEURO:  A&O x 3.  Normal Mood & Affect   ASSESSMENT & PLAN:    Problem List Items Addressed This Visit    Coronary artery disease involving native heart without angina pectoris - Primary (Chronic)    DES  PCI to the RCA in setting of an inferior STEMI.  Doing well post STEMI.  No further symptoms. Relook catheterization showed widely patent RCA stent despite  having brief episode of nonsustained VT on stress test.   No active anginal symptoms.  No requirement of nitroglycerin.  Plan: On stable regimen.  No major changes  Continue maintenance dose Brilinta without aspirin.  Okay to hold Brilinta 5-7 days preop for surgeries or procedures.  Continue current dose of metoprolol tartrate and lisinopril along with rosuvastatin      Relevant Medications   rosuvastatin (CRESTOR) 40 MG tablet   metoprolol tartrate (LOPRESSOR) 25 MG tablet   lisinopril (ZESTRIL) 2.5 MG tablet   fenofibrate 160 MG tablet   icosapent Ethyl (VASCEPA) 1 g capsule   Presence of drug coated stent in right coronary artery: Promus Premier DES 3.0 mm x 18 mm (3.5 mm) to mid RCA occlusion 12/03/12 (Chronic)     Continue maintenance dose Brilinta without aspirin.  Okay to hold Brilinta 5-7 days preop for surgeries or procedures.      ST elevation myocardial infarction (STEMI) of inferior wall, subsequent episode of care [facility]) (Chronic)    Doing well with no further angina symptoms.  EF preserved on echo.  Clearly probably has some scar from his MI.  Interestingly there is a run of nonsustained VT on stress test.  Thankfully, nothing prolonged and no symptoms.  No heart failure or angina symptoms.      Relevant Medications   rosuvastatin (CRESTOR) 40 MG tablet   metoprolol tartrate (LOPRESSOR) 25 MG tablet   lisinopril (ZESTRIL) 2.5 MG tablet   fenofibrate 160 MG tablet   icosapent Ethyl (VASCEPA) 1 g capsule   HYPERLIPIDEMIA, MIXED -- significant hypertriglyceridemia (Chronic)    Overall his labs look much better than before.  Triglyceride elevation clearly has a genetic component and may also be related to glycemic control.  He is on fenofibrate and Vascepa along with rosuvastatin.  Plan: Increase Vascepa dose to  2 g twice daily  He is due for labs to be checked again in July August timeframe.  Depending on those results, may need to consider CVRR consultation and potentially genetic evaluation with Dr. Debara Pickett      Relevant Medications   rosuvastatin (CRESTOR) 40 MG tablet   metoprolol tartrate (LOPRESSOR) 25 MG tablet   lisinopril (ZESTRIL) 2.5 MG tablet   fenofibrate 160 MG tablet   icosapent Ethyl (VASCEPA) 1 g capsule   Essential hypertension (Chronic)    Blood pressure looks great on current meds.  We increase his metoprolol to 25 mg twice daily because of the nonsustained VT on the stress test.  No signs or symptoms of palpitations.      Relevant Medications   rosuvastatin (CRESTOR) 40 MG tablet   metoprolol tartrate (LOPRESSOR) 25 MG tablet   lisinopril (ZESTRIL) 2.5 MG tablet   fenofibrate 160 MG tablet   icosapent Ethyl (VASCEPA) 1 g capsule      COVID-19 Education: The signs and symptoms of COVID-19 were discussed with the patient and how to seek care for testing (follow up with PCP or arrange E-visit).   The importance of social distancing and COVID-19 vaccine was discussed today.  Time:   Today, I have spent 15 minutes with the patient with telehealth technology discussing the above problems.  Additional 8 minutes spent on chart review and charting. Total time 23 minutes   Medication Adjustments/Labs and Tests Ordered: Current medicines are reviewed at length with the patient today.  Concerns regarding medicines are outlined above.   Patient Instructions  Medication Instructions:    We will increase the Vascepa to 2 tablets  twice a day (until current prescription is complete simply take 2 of your current tablets twice a day, then the new prescription will account for this change)  *If you need a refill on your cardiac medications before your next appointment, please call your pharmacy*  Lab Work: Simply continue to get lab work done with your PCP  If you have labs  (blood work) drawn today and your tests are completely normal, you will receive your results only by: Marland Kitchen MyChart Message (if you have MyChart) OR . A paper copy in the mail If you have any lab test that is abnormal or we need to change your treatment, we will call you to review the results.  Testing/Procedures: None  Follow-Up: At [facility], you and your health needs are our priority.  As part of our continuing mission to provide you with exceptional heart care, we have created designated Provider Care Teams.  These Care Teams include your primary Cardiologist (physician) and Advanced Practice Providers (APPs -  Physician Assistants and Nurse Practitioners) who all work together to provide you with the care you need, when you need it.  Your next appointment:   7 month(s)   The format for your next appointment:   In Person  Provider:   Glenetta Hew, MD  Other Instructions I do recommend that you take the Shingles vaccine as well as Covid vaccine     Signed, Glenetta Hew, MD  12/09/2019 11:20 AM    [place]

## 2019-12-09 NOTE — Assessment & Plan Note (Signed)
Overall his labs look much better than before.  Triglyceride elevation clearly has a genetic component and may also be related to glycemic control.  He is on fenofibrate and Vascepa along with rosuvastatin.  Plan: Increase Vascepa dose to 2 g twice daily  He is due for labs to be checked again in July August timeframe.  Depending on those results, may need to consider CVRR consultation and potentially genetic evaluation with Dr. Debara Pickett

## 2019-12-09 NOTE — Assessment & Plan Note (Signed)
Blood pressure looks great on current meds.  We increase his metoprolol to 25 mg twice daily because of the nonsustained VT on the stress test.  No signs or symptoms of palpitations.

## 2019-12-09 NOTE — Assessment & Plan Note (Addendum)
DES PCI to the RCA in setting of an inferior STEMI.  Doing well post STEMI.  No further symptoms. Relook catheterization showed widely patent RCA stent despite having brief episode of nonsustained VT on stress test.   No active anginal symptoms.  No requirement of nitroglycerin.  Plan: On stable regimen.  No major changes  Continue maintenance dose Brilinta without aspirin.  Okay to hold Brilinta 5-7 days preop for surgeries or procedures.  Continue current dose of metoprolol tartrate and lisinopril along with rosuvastatin

## 2019-12-09 NOTE — Patient Instructions (Addendum)
Medication Instructions:    We will increase the Vascepa to 2 tablets twice a day (until current prescription is complete simply take 2 of your current tablets twice a day, then the new prescription will account for this change)  *If you need a refill on your cardiac medications before your next appointment, please call your pharmacy*  Lab Work: Simply continue to get lab work done with your PCP  If you have labs (blood work) drawn today and your tests are completely normal, you will receive your results only by: Marland Kitchen MyChart Message (if you have MyChart) OR . A paper copy in the mail If you have any lab test that is abnormal or we need to change your treatment, we will call you to review the results.  Testing/Procedures: None  Follow-Up: At [facility], you and your health needs are our priority.  As part of our continuing mission to provide you with exceptional heart care, we have created designated Provider Care Teams.  These Care Teams include your primary Cardiologist (physician) and Advanced Practice Providers (APPs -  Physician Assistants and Nurse Practitioners) who all work together to provide you with the care you need, when you need it.  Your next appointment:   7 month(s)   The format for your next appointment:   In Person  Provider:   Glenetta Hew, MD  Other Instructions I do recommend that you take the Shingles vaccine as well as Covid vaccine

## 2019-12-09 NOTE — Telephone Encounter (Signed)
Called patient to discuss AVS instructions gave Dr. Harding's recommendations and patient voiced understanding. AVS summary mailed to patient.    

## 2019-12-09 NOTE — Addendum Note (Signed)
Addended by: Leonie Man on: 12/09/2019 11:21 AM   Modules accepted: Level of Service

## 2019-12-09 NOTE — Assessment & Plan Note (Signed)
Doing well with no further angina symptoms.  EF preserved on echo.  Clearly probably has some scar from his MI.  Interestingly there is a run of nonsustained VT on stress test.  Thankfully, nothing prolonged and no symptoms.  No heart failure or angina symptoms.

## 2019-12-12 ENCOUNTER — Encounter: Payer: Self-pay | Admitting: Family Medicine

## 2019-12-15 LAB — HM DIABETES EYE EXAM

## 2019-12-20 ENCOUNTER — Encounter: Payer: Self-pay | Admitting: Family Medicine

## 2019-12-21 ENCOUNTER — Ambulatory Visit: Payer: 59 | Attending: Internal Medicine

## 2019-12-21 DIAGNOSIS — Z20822 Contact with and (suspected) exposure to covid-19: Secondary | ICD-10-CM

## 2019-12-22 LAB — NOVEL CORONAVIRUS, NAA: SARS-CoV-2, NAA: NOT DETECTED

## 2020-02-19 ENCOUNTER — Ambulatory Visit: Payer: 59 | Attending: Internal Medicine

## 2020-02-19 DIAGNOSIS — Z23 Encounter for immunization: Secondary | ICD-10-CM

## 2020-02-19 NOTE — Progress Notes (Signed)
   Covid-19 Vaccination Clinic  Name:  [name] [name]    MRN: [id number] DOB: May 25, 1961  02/19/2020  Mr. [name] [name] was observed post Covid-19 immunization for 15 minutes without incident. He was provided with Vaccine Information Sheet and instruction to access the V-Safe system.   Mr. [name] [name] was instructed to call 911 with any severe reactions post vaccine: Marland Kitchen Difficulty breathing  . Swelling of face and throat  . A fast heartbeat  . A bad rash all over body  . Dizziness and weakness   Immunizations Administered    Name Date Dose VIS Date Route   Pfizer COVID-19 Vaccine 02/19/2020  8:48 AM 0.3 mL 11/12/2019 Intramuscular   Manufacturer: [place]   Lot: [id number]   [place]: [id number]

## 2020-02-21 ENCOUNTER — Other Ambulatory Visit: Payer: Self-pay | Admitting: Family Medicine

## 2020-02-21 DIAGNOSIS — E1149 Type 2 diabetes mellitus with other diabetic neurological complication: Secondary | ICD-10-CM

## 2020-03-02 ENCOUNTER — Other Ambulatory Visit (INDEPENDENT_AMBULATORY_CARE_PROVIDER_SITE_OTHER): Payer: 59

## 2020-03-02 ENCOUNTER — Other Ambulatory Visit: Payer: Self-pay

## 2020-03-02 DIAGNOSIS — E1149 Type 2 diabetes mellitus with other diabetic neurological complication: Secondary | ICD-10-CM | POA: Diagnosis not present

## 2020-03-02 LAB — COMPREHENSIVE METABOLIC PANEL
ALT: 30 U/L (ref 0–53)
AST: 21 U/L (ref 0–37)
Albumin: 4.6 g/dL (ref 3.5–5.2)
Alkaline Phosphatase: 43 U/L (ref 39–117)
BUN: 14 mg/dL (ref 6–23)
CO2: 25 mEq/L (ref 19–32)
Calcium: 9.2 mg/dL (ref 8.4–10.5)
Chloride: 102 mEq/L (ref 96–112)
Creatinine, Ser: 0.77 mg/dL (ref 0.40–1.50)
GFR: 102.94 mL/min (ref >60.00)
Glucose, Bld: 145 mg/dL — ABNORMAL HIGH (ref 70–99)
Potassium: 4.5 mEq/L (ref 3.5–5.1)
Sodium: 134 mEq/L — ABNORMAL LOW (ref 135–145)
Total Bilirubin: 0.5 mg/dL (ref 0.2–1.2)
Total Protein: 6.7 g/dL (ref 6.0–8.3)

## 2020-03-02 LAB — CBC WITH DIFFERENTIAL/PLATELET
Basophils Absolute: 0 10*3/uL (ref 0.0–0.1)
Basophils Relative: 0.7 % (ref 0.0–3.0)
Eosinophils Absolute: 0.2 10*3/uL (ref 0.0–0.7)
Eosinophils Relative: 3.1 % (ref 0.0–5.0)
HCT: 39.9 % (ref 39.0–52.0)
Hemoglobin: 13.7 g/dL (ref 13.0–17.0)
Lymphocytes Relative: 39 % (ref 12.0–46.0)
Lymphs Abs: 2.4 10*3/uL (ref 0.7–4.0)
MCHC: 34.4 g/dL (ref 30.0–36.0)
MCV: 84.3 fl (ref 78.0–100.0)
Monocytes Absolute: 0.5 10*3/uL (ref 0.1–1.0)
Monocytes Relative: 8.5 % (ref 3.0–12.0)
Neutro Abs: 3 10*3/uL (ref 1.4–7.7)
Neutrophils Relative %: 48.7 % (ref 43.0–77.0)
Platelets: 233 10*3/uL (ref 150.0–400.0)
RBC: 4.74 Mil/uL (ref 4.22–5.81)
RDW: 13.8 % (ref 11.5–15.5)
WBC: 6.2 10*3/uL (ref 4.0–10.5)

## 2020-03-02 LAB — LIPID PANEL
Cholesterol: 145 mg/dL (ref 0–200)
HDL: 32.1 mg/dL — ABNORMAL LOW (ref >39.00)
NonHDL: 113.18
Total CHOL/HDL Ratio: 5
Triglycerides: 221 mg/dL — ABNORMAL HIGH (ref 0.0–149.0)
VLDL: 44.2 mg/dL — ABNORMAL HIGH (ref 0.0–40.0)

## 2020-03-02 LAB — HEMOGLOBIN A1C: Hgb A1c MFr Bld: 7.9 % — ABNORMAL HIGH (ref 4.6–6.5)

## 2020-03-02 LAB — TSH: TSH: 1.06 u[IU]/mL (ref 0.35–4.50)

## 2020-03-02 LAB — VITAMIN B12: Vitamin B-12: 681 pg/mL (ref 211–911)

## 2020-03-02 LAB — LDL CHOLESTEROL, DIRECT: Direct LDL: 72 mg/dL

## 2020-03-09 ENCOUNTER — Other Ambulatory Visit: Payer: Self-pay

## 2020-03-09 ENCOUNTER — Ambulatory Visit (INDEPENDENT_AMBULATORY_CARE_PROVIDER_SITE_OTHER): Payer: 59 | Admitting: Family Medicine

## 2020-03-09 ENCOUNTER — Encounter: Payer: Self-pay | Admitting: Family Medicine

## 2020-03-09 VITALS — BP 114/64 | HR 78 | Temp 97.5°F | Ht 70.0 in | Wt 223.1 lb

## 2020-03-09 DIAGNOSIS — Z0000 Encounter for general adult medical examination without abnormal findings: Secondary | ICD-10-CM | POA: Diagnosis not present

## 2020-03-09 DIAGNOSIS — E782 Mixed hyperlipidemia: Secondary | ICD-10-CM

## 2020-03-09 DIAGNOSIS — E1149 Type 2 diabetes mellitus with other diabetic neurological complication: Secondary | ICD-10-CM

## 2020-03-09 NOTE — Patient Instructions (Signed)
Don't change your meds but keep working on diet and exercise and recheck in about 4 months with A1c at the visit.   I'll check on the GI appointment in about 1 month after you have had your 2nd covid shot.  Take care.  Glad to see you.

## 2020-03-09 NOTE — Progress Notes (Signed)
This visit occurred during the SARS-CoV-2 public health emergency.  Safety protocols were in place, including screening questions prior to the visit, additional usage of staff PPE, and extensive cleaning of exam room while observing appropriate contact time as indicated for disinfecting solutions.  Diabetes:  Using medications without difficulties: taking 60 units insulin.  Metformin 1000mg  a day.   Hypoglycemic episodes: rare, cautions d/w pt.  Hyperglycemic episodes: no Feet problems: at baseline, some tingling.  Not worse than prev, may be some better.   Blood Sugars averaging: 120-170s, usually on the lower end of that range.  More consistent than in the past.   eye exam within last year: yes He has been paying attention to diet.    He is working and doing a lot of lifting at home. Paraspinal muscle soreness with splitting wood. No CP.    Elevated Cholesterol: Using medications without problems: yes Muscle aches: not from statin, see above. Diet compliance: Encouraged Exercise: Encouraged Still on crestor.    covid vaccine #1, Pfizer, done 02/19/20. tdap 2015 PNA 2014 Flu pref done Shingles d/w pt.   Prostate cancer screening and PSA options (with potential risks and benefits of testing vs not testing) were discussed along with recent recs/guidelines.  He declined testing PSA at this point. Colonoscopy 2011.  D/w pt about follow up after getting 2nd covid vaccine.  Can refer in about 1-2 months.   Diet and exercise d/w pt.    Meds, vitals, and allergies reviewed.  ROS: Per HPI unless specifically indicated in ROS section   GEN: nad, alert and oriented HEENT: mucous membranes moist NECK: supple w/o LA CV: rrr. PULM: ctab, no inc wob ABD: soft, +bs EXT: no edema SKIN: no acute rash  Diabetic foot exam: Normal inspection No skin breakdown Callus on L 1st toe noted.  No ulceration.   Normal DP pulses Normal sensation to light touch and monofilament Nails normal

## 2020-03-12 NOTE — Assessment & Plan Note (Signed)
Continue Crestor.  Labs discussed with patient.  Continue work on diet and exercise. 

## 2020-03-12 NOTE — Assessment & Plan Note (Signed)
Discussed with patient about labs and options. No change in meds at this point but he will keep working on diet and exercise and recheck in about 4 months with A1c at the visit.

## 2020-03-12 NOTE — Assessment & Plan Note (Signed)
covid vaccine #1, Pfizer, done 02/19/20. tdap 2015 PNA 2014 Flu pref done Shingles d/w pt.   Prostate cancer screening and PSA options (with potential risks and benefits of testing vs not testing) were discussed along with recent recs/guidelines.  He declined testing PSA at this point. Colonoscopy 2011.  D/w pt about follow up after getting 2nd covid vaccine.  Can refer in about 1-2 months.   Diet and exercise d/w pt.

## 2020-03-14 ENCOUNTER — Ambulatory Visit: Payer: 59 | Attending: Internal Medicine

## 2020-03-14 DIAGNOSIS — Z23 Encounter for immunization: Secondary | ICD-10-CM

## 2020-03-14 NOTE — Progress Notes (Signed)
   Covid-19 Vaccination Clinic  Name:  [name] [name]    MRN: [id number] DOB: 05-03-1961  03/14/2020  [name] [name] was observed post Covid-19 immunization for 15 minutes without incident. He was provided with Vaccine Information Sheet and instruction to access the V-Safe system.   [name] [name] was instructed to call 911 with any severe reactions post vaccine: Marland Kitchen Difficulty breathing  . Swelling of face and throat  . A fast heartbeat  . A bad rash all over body  . Dizziness and weakness   Immunizations Administered    Name Date Dose VIS Date Route   Pfizer COVID-19 Vaccine 03/14/2020  4:13 PM 0.3 mL 11/12/2019 Intramuscular   Manufacturer: [place]   Lot: [id number]   [place]: [id number]

## 2020-03-30 ENCOUNTER — Other Ambulatory Visit: Payer: Self-pay | Admitting: Family Medicine

## 2020-04-11 ENCOUNTER — Other Ambulatory Visit: Payer: Self-pay | Admitting: Family Medicine

## 2020-04-19 ENCOUNTER — Telehealth: Payer: Self-pay | Admitting: Family Medicine

## 2020-04-19 DIAGNOSIS — Z1211 Encounter for screening for malignant neoplasm of colon: Secondary | ICD-10-CM

## 2020-04-19 NOTE — Telephone Encounter (Signed)
Please let patient know that I put in the referral for GI.  He should get a call from them.  Thanks.

## 2020-04-20 NOTE — Telephone Encounter (Addendum)
Left detailed message on voicemail. DPR 

## 2020-06-19 ENCOUNTER — Encounter: Payer: Self-pay | Admitting: Gastroenterology

## 2020-07-11 ENCOUNTER — Telehealth: Payer: Self-pay | Admitting: Cardiology

## 2020-07-11 ENCOUNTER — Telehealth: Payer: Self-pay | Admitting: *Deleted

## 2020-07-11 NOTE — Telephone Encounter (Signed)
A detailed message was left,re: his follow up visit. °

## 2020-07-11 NOTE — Telephone Encounter (Signed)
LVM for patient to return call to get follow up scheduled with Harding from recall list 

## 2020-07-24 ENCOUNTER — Ambulatory Visit: Payer: 59 | Admitting: Family Medicine

## 2020-07-26 ENCOUNTER — Other Ambulatory Visit: Payer: Self-pay | Admitting: Family Medicine

## 2020-08-01 ENCOUNTER — Other Ambulatory Visit: Payer: Self-pay

## 2020-08-01 ENCOUNTER — Ambulatory Visit (INDEPENDENT_AMBULATORY_CARE_PROVIDER_SITE_OTHER): Payer: 59 | Admitting: Family Medicine

## 2020-08-01 ENCOUNTER — Encounter: Payer: Self-pay | Admitting: Family Medicine

## 2020-08-01 VITALS — BP 122/64 | HR 80 | Temp 96.9°F | Ht 70.0 in | Wt 218.4 lb

## 2020-08-01 DIAGNOSIS — E119 Type 2 diabetes mellitus without complications: Secondary | ICD-10-CM | POA: Diagnosis not present

## 2020-08-01 DIAGNOSIS — E1149 Type 2 diabetes mellitus with other diabetic neurological complication: Secondary | ICD-10-CM

## 2020-08-01 LAB — POCT GLYCOSYLATED HEMOGLOBIN (HGB A1C): Hemoglobin A1C: 7.4 % — AB (ref 4.0–5.6)

## 2020-08-01 NOTE — Patient Instructions (Addendum)
Recheck in about 4 months with A1c at the visit.  Take care.  Glad to see you. Update me as needed.  Thanks for your effort.

## 2020-08-01 NOTE — Progress Notes (Signed)
This visit occurred during the SARS-CoV-2 public health emergency.  Safety protocols were in place, including screening questions prior to the visit, additional usage of staff PPE, and extensive cleaning of exam room while observing appropriate contact time as indicated for disinfecting solutions.  Diabetes:  Using medications without difficulties: yes Hypoglycemic episodes:no Hyperglycemic episodes:no  Feet problems: no change from prev.  Burning sensation at baseline.   Blood Sugars averaging: 112-170 recently.   eye exam within last year: yes 60 units insulin.   A1c improved from 7.9 to 7.4.   5 lbs weight loss.    He is working on diet.    Meds, vitals, and allergies reviewed.  ROS: Per HPI unless specifically indicated in ROS section   GEN: nad, alert and oriented HEENT: ncat NECK: supple w/o LA CV: rrr. PULM: ctab, no inc wob ABD: soft, +bs EXT: no edema SKIN: no acute rash

## 2020-08-02 NOTE — Assessment & Plan Note (Signed)
A1c improved.  Labs discussed with patient.  Continue glipizide, Lantus and Metformin.  Recheck periodically.  Continue work on diet and exercise as that may continue to improve his A1c.  He agrees with plan.

## 2020-08-25 ENCOUNTER — Telehealth: Payer: Self-pay

## 2020-08-25 ENCOUNTER — Ambulatory Visit (INDEPENDENT_AMBULATORY_CARE_PROVIDER_SITE_OTHER): Payer: 59 | Admitting: Gastroenterology

## 2020-08-25 ENCOUNTER — Encounter: Payer: Self-pay | Admitting: Gastroenterology

## 2020-08-25 VITALS — BP 126/74 | HR 80 | Ht 70.0 in | Wt 219.0 lb

## 2020-08-25 DIAGNOSIS — Z1211 Encounter for screening for malignant neoplasm of colon: Secondary | ICD-10-CM | POA: Diagnosis not present

## 2020-08-25 DIAGNOSIS — Z800 Family history of malignant neoplasm of digestive organs: Secondary | ICD-10-CM

## 2020-08-25 DIAGNOSIS — Z7902 Long term (current) use of antithrombotics/antiplatelets: Secondary | ICD-10-CM | POA: Diagnosis not present

## 2020-08-25 DIAGNOSIS — Z86010 Personal history of colonic polyps: Secondary | ICD-10-CM | POA: Diagnosis not present

## 2020-08-25 MED ORDER — SUPREP BOWEL PREP KIT 17.5-3.13-1.6 GM/177ML PO SOLN: 1.0000 | ORAL | 0 refills | Status: DC

## 2020-08-25 NOTE — Telephone Encounter (Signed)
Primary Cardiologist: Dr. Ellyn Hack   Chart reviewed as part of pre-operative protocol coverage. Because of [name] [name]'s past medical history and time since last visit, he/she will require a follow-up visit in order to better assess preoperative cardiovascular risk.  Pre-op covering staff: - Please schedule appointment and call patient to inform them. - Please contact requesting surgeon's office via preferred method (i.e, phone, fax) to inform them of need for appointment prior to surgery.  If applicable, this message will also be routed to pharmacy pool and/or primary cardiologist for input on holding anticoagulant/antiplatelet agent as requested below so that this information is available at time of patient's appointment.   [name], NP  08/25/2020, 3:40 PM

## 2020-08-25 NOTE — Telephone Encounter (Signed)
Will send to [facility] where pt see's Dr. Ellyn Hack to ask for their assistance in scheduling this pt for pre op clearance appt. I will send a message to [facility] scheduling.

## 2020-08-25 NOTE — Patient Instructions (Addendum)
If you are age 61 or older, your body mass index should be between 23-30. Your Body mass index is 31.42 kg/m. If this is out of the aforementioned range listed, please consider follow up with your Primary Care Provider.  If you are age 60 or younger, your body mass index should be between 19-25. Your Body mass index is 31.42 kg/m. If this is out of the aformentioned range listed, please consider follow up with your Primary Care Provider.   We have sent the following medications to your pharmacy for you to pick up at your convenience: [place] will be contaced by our office prior to your procedure for directions on holding your Brillanta.  If you do not hear from our office 1 week prior to your scheduled procedure, please call [phone] to discuss.  You have been scheduled for a colonoscopy. Please follow written instructions given to you at your visit today.  Please pick up your prep supplies at the pharmacy within the next 1-3 days. If you use inhalers (even only as needed), please bring them with you on the day of your procedure.   Thank you for choosing me and [facility].  Dr. Rush Landmark

## 2020-08-25 NOTE — Telephone Encounter (Signed)
Request for surgical clearance:     Endoscopy Procedure  What type of surgery is being performed?     Colonoscopy   When is this surgery scheduled?     10/12/2020  What type of clearance is required ?   Pharmacy  Are there any medications that need to be held prior to surgery and how long? Brilinta x5 days prior to procedure    Practice name and name of physician performing surgery?      [facility]-Dr.Mansouraty  What is your office phone and fax number?      Phone- [phone]  [phone]  Anesthesia type (None, local, MAC, general) ?       MAC

## 2020-08-27 ENCOUNTER — Encounter: Payer: Self-pay | Admitting: Gastroenterology

## 2020-08-27 DIAGNOSIS — Z7902 Long term (current) use of antithrombotics/antiplatelets: Secondary | ICD-10-CM | POA: Insufficient documentation

## 2020-08-27 DIAGNOSIS — Z86010 Personal history of colonic polyps: Secondary | ICD-10-CM | POA: Insufficient documentation

## 2020-08-27 DIAGNOSIS — Z800 Family history of malignant neoplasm of digestive organs: Secondary | ICD-10-CM | POA: Insufficient documentation

## 2020-08-27 DIAGNOSIS — Z1211 Encounter for screening for malignant neoplasm of colon: Secondary | ICD-10-CM | POA: Insufficient documentation

## 2020-08-27 NOTE — Progress Notes (Addendum)
[place] VISIT   Primary Care Provider Tonia Ghent, MD [address] [place] [address] [phone]  Referring Provider Tonia Ghent, MD [address] [place],  [place] [address] [phone]  Patient Profile: [name] [name] is a 59 y.o. male with a pmh significant for CAD (status post prior PCI and ticagrelor), hypertension, hyperlipidemia, reported potential family history of colon cancer (father), hyperplastic colon polyps.  The patient presents to the [facility] for an evaluation and management of problem(s) noted below:  Problem List 1. Colon cancer screening   2. History of hyperplastic colon polyps   3. Family history of colon cancer   4. Antiplatelet or antithrombotic long-term use     History of Present Illness This is the patient's first visit to the outpatient [facility] clinic.  In 2011 he underwent a colonoscopy by Dr. Deatra Ina and was found to have multiple hyperplastic appearing polyps that were resected and returned as all hyperplastic under pathology.  He was recommended a 10-year colonoscopy follow-up.  Since that time he is not had a coronary event leading to the requirement of PCI intervention as well as ticagrelor use.  He has done well and has not had an intervention for years.  He denies any significant changes in his bowel habits.  He has a normal bowel movement once or twice daily.  No blood in his stools.  He denies any other significant GI symptoms as noted below.  GI Review of Systems Positive as above Negative for pyrosis, dysphagia, odynophagia, nausea, vomiting, pain, change in bowel habits  Review of Systems General: Denies fevers/chills/weight loss unintentionally HEENT: Denies oral lesions Cardiovascular: Denies chest pain/palpitations Pulmonary: Denies shortness of breath Gastroenterological: See HPI Genitourinary: Denies darkened urine Hematological: Positive for  easy bruising/bleeding due to ticagrelor Dermatological: Denies jaundice Psychological: Mood is stable   Medications Current Outpatient Medications  Medication Sig Dispense Refill  . fenofibrate 160 MG tablet Take 1 tablet (160 mg total) by mouth daily. NEEDS APPOINTMENT FOR FUTURE REFILLS 90 tablet 3  . folic acid (FOLVITE) 626 MCG tablet TAKE 1 TABLET BY MOUTH  DAILY 90 tablet 3  . glipiZIDE (GLUCOTROL) 5 MG tablet TAKE 1 TABLET BY MOUTH  DAILY BEFORE BREAKFAST 90 tablet 3  . icosapent Ethyl (VASCEPA) 1 g capsule Take 2 capsules (2 g total) by mouth 2 (two) times daily. 360 capsule 3  . Insulin Glargine (LANTUS SOLOSTAR) 100 UNIT/ML Solostar Pen INJECT UP TO 60 UNITS SUBCUTANOUSLY ONCE DAILY 5 pen PRN  . Insulin Pen Needle (B-D UF [name] MINI PEN NEEDLES) 31G X 5 MM MISC USE AS DIRECTED WITH INSULIN PEN 100 each 3  . lisinopril (ZESTRIL) 2.5 MG tablet Take 1 tablet (2.5 mg total) by mouth daily. NEEDS APPOINTMENT FOR FUTURE REFILLS 90 tablet 3  . metFORMIN (GLUCOPHAGE) 500 MG tablet TAKE 1 TABLET BY MOUTH  TWICE DAILY WITH MEALS 180 tablet 3  . metoprolol tartrate (LOPRESSOR) 25 MG tablet Take 1 tablet (25 mg total) by mouth 2 (two) times daily. 180 tablet 3  . nitroGLYCERIN (NITROSTAT) 0.4 MG SL tablet Place 1 tablet (0.4 mg total) under the tongue every 5 (five) minutes as needed for chest pain. 25 tablet 12  . ONETOUCH DELICA LANCETS 94W MISC Use as instructed to check blood sugar once daily or as needed.  Diagnosis:  E11.49  Non insulin dependent. 100 each 3  . ONETOUCH VERIO test strip TEST ONCE DAILY AND AS NEEDED.  50 strip 11  . rosuvastatin (CRESTOR) 40 MG tablet Take 1 tablet (40 mg total) by mouth daily. MUST KEEP APPOINTMENT 12/06/19 WITH DR HARDING FOR FUTURE REFILLS 90 tablet 3  . ticagrelor (BRILINTA) 60 MG TABS tablet Take 1 tablet (60 mg total) by mouth 2 (two) times daily. NEED OV FOR FUTURE REFILL 180 tablet 3  . vitamin B-12 (CYANOCOBALAMIN) 1000 MCG tablet Take 1 tablet (1,000  mcg total) by mouth daily.    . vitamin B-6 (PYRIDOXINE) 25 MG tablet Take 1 tablet (25 mg total) by mouth daily.    . Na Sulfate-K Sulfate-Mg Sulf (SUPREP BOWEL PREP KIT) 17.5-3.13-1.6 GM/177ML SOLN Take 1 kit by mouth as directed. For colonoscopy prep 354 mL 0   No current facility-administered medications for this visit.    Allergies Allergies  Allergen Reactions  . Metformin And Related     Intolerant of more than $RemoveB'1000mg'qaGnyEod$  a day    Histories Past Medical History:  Diagnosis Date  . Allergic rhinitis   . CAD S/P percutaneous coronary angioplasty 12/03/2013   PCI-RCA - Xience Alpine DES 3.0 mm x 18 mm --> 3.5 mm; Echo 1/'15" EF 45-50%, mild C LVH, mild Inf HK, Gr 1 DD  . Diabetes mellitus    type2  . Fracture of right hand 1983  . Hyperlipidemia   . Hypertension   . Nocturia   . Paresthesia    tingling in feet  . STEMI (ST elevation myocardial infarction) of inf. wall 12/03/2013   Past Surgical History:  Procedure Laterality Date  . KNEE SURGERY     B arthroscopic  . LEFT HEART CATH AND CORONARY ANGIOGRAPHY N/A 07/16/2018   Procedure: LEFT HEART CATH AND CORONARY ANGIOGRAPHY;  Surgeon: Belva Crome, MD;  Location: [facility];  Service: Cardiovascular;  Laterality: N/A;  . LEFT HEART CATHETERIZATION WITH CORONARY ANGIOGRAM N/A 12/03/2013   Procedure: LEFT HEART CATHETERIZATION WITH CORONARY ANGIOGRAM;  Surgeon: Leonie Man, MD;  [facility];  Service: Cardiovascular;  Laterality: N/A;  . NM MYOVIEW LTD  11/2015; 06/2018   a) Normal; b)  EF 51%.  Findings consistent with prior MI with peri-infarct ischemia (LOW RISK).  Small defect of mild severity in the basal inferior mid inferior location.  There is also horizontal ST segment depression in inferior leads.  Although low risk images, the EKG changes are concerning.  There is concern for possible multivessel CAD.  Marland Kitchen PERCUTANEOUS CORONARY STENT INTERVENTION (PCI-S)  12/03/2013   mRCA - Xience Alpine DES 3.0 mm x 18  mm --> 3.5 mm  . SEPTOPLASTY  1970   due to Fx Nose  . TRANSTHORACIC ECHOCARDIOGRAM  12/03/2013; 11/2014   a. Mild Conc LVH; EF 45-50%, Mild Inferior HK. Grade 1 DD;; b. EF 55-60%. No RWMA. Moderate RV dilation but normal function.   Social History   Socioeconomic History  . Marital status: Married    Spouse name: Not on file  . Number of children: 1  . Years of education: Not on file  . Highest education level: Not on file  Occupational History  . Occupation: Engineer, materials: [employer]    Comment: Patent attorney  Tobacco Use  . Smoking status: Former Smoker    Packs/day: 0.50    Years: 20.00    Pack years: 10.00    Types: Cigarettes    Quit date: 12/03/2013    Years since quitting: 6.7  . Smokeless tobacco: Never Used  Vaping Use  . Vaping Use: Never used  Substance and Sexual Activity  . Alcohol use: Yes    Comment: occ on weekends  . Drug use: No  . Sexual activity: Not on file  Other Topics Concern  . Not on file  Social History Narrative   Married 1988 and lives with wife   One son   Works at Museum/gallery exhibitions officer, Librarian, academic.   Forward 10 pack year smoker, who quit on the day of his MI.   Social Determinants of Health   Financial Resource Strain:   . Difficulty of Paying Living Expenses: Not on file  Food Insecurity:   . Worried About Charity fundraiser in the Last Year: Not on file  . Ran Out of Food in the Last Year: Not on file  Transportation Needs:   . Lack of Transportation (Medical): Not on file  . Lack of Transportation (Non-Medical): Not on file  Physical Activity:   . Days of Exercise per Week: Not on file  . Minutes of Exercise per Session: Not on file  Stress:   . Feeling of Stress : Not on file  Social Connections:   . Frequency of Communication with Friends and Family: Not on file  . Frequency of Social Gatherings with Friends and Family: Not on file  . Attends Religious Services: Not on file  .  Active Member of Clubs or Organizations: Not on file  . Attends Archivist Meetings: Not on file  . Marital Status: Not on file  Intimate Partner Violence:   . Fear of Current or Ex-Partner: Not on file  . Emotionally Abused: Not on file  . Physically Abused: Not on file  . Sexually Abused: Not on file   Family History  Problem Relation Age of Onset  . Cancer Mother        ovarian  . Hypertension Father   . Hyperlipidemia Father   . Diabetes Father        lost 60 pounds and off all meds  . Cancer Father        bladder- Kidney cancer  . Colon cancer Father   . Hearing loss Brother   . Heart disease Paternal Grandfather        MI  . Prostate cancer Neg Hx   . Esophageal cancer Neg Hx   . Inflammatory bowel disease Neg Hx   . Liver disease Neg Hx   . Pancreatic cancer Neg Hx   . Stomach cancer Neg Hx    I have reviewed his medical, social, and family history in detail and updated the electronic medical record as necessary.    PHYSICAL EXAMINATION  BP 126/74   Pulse 80   Ht $R'5\' 10"'mY$  (1.778 m)   Wt 219 lb (99.3 kg)   BMI 31.42 kg/m  Wt Readings from Last 3 Encounters:  08/25/20 219 lb (99.3 kg)  08/01/20 218 lb 6 oz (99.1 kg)  03/09/20 223 lb 2 oz (101.2 kg)  GEN: NAD, appears stated age, doesn't appear chronically ill PSYCH: Cooperative, without pressured speech EYE: Conjunctivae pink, sclerae anicteric ENT: MMM CV: Nontachycardic RESP: Without audible wheezing GI: NABS, soft, NT/ND, without rebound or guarding MSK/EXT: No lower extremity edema SKIN: No jaundice NEURO:  Alert & Oriented x 3, no focal deficits   REVIEW OF DATA  I reviewed the following data at the time of this encounter:  GI Procedures and Studies  2011 colonoscopy There were multiple polyps identified and removed in  the rectum and sigmoid colon.  Multiple 1 to 4 mm sessile, hyperplastic appearing polyps from rectum to 25 cm from the anus.  The 3 largest polyps were removed with cold  polypectomy snare and submitted to pathology.  Retroflexed views in the rectum revealed no abnormalities. Pathology 1. COLON, POLYP(S), SIGMOID : - HYPERPLASTIC POLYPS (3 FRAGMENTS).  - there is no evidence of malignancy.   Laboratory Studies  Reviewed those in epic  Imaging Studies  No relevant studies to review   ASSESSMENT  Mr. Enberg is a 59 y.o. male with a pmh significant for CAD (status post prior PCI and ticagrelor), hypertension, hyperlipidemia, reported potential family history of colon cancer (father), hyperplastic colon polyps.  The patient is seen today for evaluation and management of:  1. Colon cancer screening   2. History of hyperplastic colon polyps   3. Family history of colon cancer   4. Antiplatelet or antithrombotic long-term use    Patient is clinically and hemodynamically stable.  The patient has a history of hyperplastic colon polyps on a colonoscopy 10 years ago.  He is due for colon cancer screening.  He remains an appropriate candidate for colonoscopy as a colon cancer screening method.  Does describe a potential newer diagnosis of colon cancer in his father.  He is going to confirm this however as it could have implications for the need for another colonoscopy for him in 5 years should his upcoming colonoscopy be normal.  We will ask our cardiology colleagues to see about his ability to come off his ticagrelor for 5 days prior to procedure.  I have no contraindication to him maintaining aspirin.  The risks and benefits of endoscopic evaluation were discussed with the patient; these include but are not limited to the risk of perforation, infection, bleeding, missed lesions, lack of diagnosis, severe illness requiring hospitalization, as well as anesthesia and sedation related illnesses.  The patient is agreeable to proceed.  All patient questions were answered to the best of my ability, and the patient agrees to the aforementioned plan of action with follow-up as  indicated.   PLAN  Proceed with scheduling colonoscopy We will reach out to cardiology to ensure ability to come off ticagrelor for at least 5 days prior to procedure Okay to maintain aspirin from GI standpoint   Orders Placed This Encounter  Procedures  . Ambulatory referral to Gastroenterology    New Prescriptions   NA SULFATE-K SULFATE-MG SULF (SUPREP BOWEL PREP KIT) 17.5-3.13-1.6 GM/177ML SOLN    Take 1 kit by mouth as directed. For colonoscopy prep   Modified Medications   No medications on file    Planned Follow Up No follow-ups on file.   Total Time in Face-to-Face and in Coordination of Care for patient including independent/personal interpretation/review of prior testing, medical history, examination, medication adjustment, communicating results with the patient directly, and documentation with the EHR is 35 minutes.   Justice Britain, MD [facility] Advanced Endoscopy Office # [phone]

## 2020-08-28 ENCOUNTER — Encounter: Payer: Self-pay | Admitting: Family Medicine

## 2020-08-28 NOTE — Telephone Encounter (Signed)
Pt is agreeable to pre op appt . Pt has been scheduled to see Kerin Ransom, [facility] 10/03/20 @ 11:45. Pt thanked me for the call. I will forward clearance notes to PA for upcoming appt. I will remove from the pre op call back pool. Will send FYI to requesting office pt has appt 10/03/20 for pre op assessment.

## 2020-08-30 ENCOUNTER — Other Ambulatory Visit: Payer: Self-pay | Admitting: Family Medicine

## 2020-10-02 NOTE — Progress Notes (Signed)
Cardiology Office Note  Date:  10/02/2020   ID:  [name] [name], DOB [birth year], [name]  PCP:  Tonia Ghent, MD  Cardiologist:  Dr. Ellyn Hack _____________  Cardiac preop evaluation  _____________   History of Present Illness: [name] [name] is a 59 y.o. male wth pmh of CAD with hx of inferior MI in 2015 s/p stent RCA in 2015, HTN, DM2, hypertriglyceridemia, false positive stress test in 2019 with subsequent cath 07/2018 showing patent stent. Last seen 12/09/2019 on telehealth visit and was stable from a cardiac standpoint.  Today, he presents for cardiac preop evaluation for routine colonoscopy scheduled for December 11th. He takes Brilinta daily. Denies bleeding issues. He denies chest pain or shortness of breath. He works full time and is on his feet all day. No anginal symptoms with exertion. He is diabetic and reports better BG control. He continues to work on this. Per Dr. Allison Quarry note 12/09/19 "Okay to hold Brilinta 5-7 days preop for surgeries or procedures." Patient denies symptoms of palpitations, orthopnea, PND, lower extremity edema, claudication, dizziness, presyncope, syncope, bleeding, or neurologic sequela. The patient is tolerating medications without difficulties and is otherwise without complaint today.  _____________   Past Medical History:  Diagnosis Date  . Allergic rhinitis   . CAD S/P percutaneous coronary angioplasty 12/03/2013   PCI-RCA - Xience Alpine DES 3.0 mm x 18 mm --> 3.5 mm; Echo 1/'15" EF 45-50%, mild C LVH, mild Inf HK, Gr 1 DD  . Diabetes mellitus    type2  . Fracture of right hand 1983  . Hyperlipidemia   . Hypertension   . Nocturia   . Paresthesia    tingling in feet  . STEMI (ST elevation myocardial infarction) of inf. wall 12/03/2013   Past Surgical History:  Procedure Laterality Date  . KNEE SURGERY     B arthroscopic  . LEFT HEART CATH AND CORONARY ANGIOGRAPHY N/A 07/16/2018   Procedure: LEFT HEART CATH AND CORONARY  ANGIOGRAPHY;  Surgeon: Belva Crome, MD;  Location: [facility];  Service: Cardiovascular;  Laterality: N/A;  . LEFT HEART CATHETERIZATION WITH CORONARY ANGIOGRAM N/A 12/03/2013   Procedure: LEFT HEART CATHETERIZATION WITH CORONARY ANGIOGRAM;  Surgeon: Leonie Man, MD;  Location: [facility] CATH [facility];  Service: Cardiovascular;  Laterality: N/A;  . NM MYOVIEW LTD  11/2015; 06/2018   a) Normal; b)  EF 51%.  Findings consistent with prior MI with peri-infarct ischemia (LOW RISK).  Small defect of mild severity in the basal inferior mid inferior location.  There is also horizontal ST segment depression in inferior leads.  Although low risk images, the EKG changes are concerning.  There is concern for possible multivessel CAD.  [facility] PERCUTANEOUS CORONARY STENT INTERVENTION (PCI-S)  12/03/2013   mRCA - Xience Alpine DES 3.0 mm x 18 mm --> 3.5 mm  . SEPTOPLASTY  1970   due to Fx Nose  . TRANSTHORACIC ECHOCARDIOGRAM  12/03/2013; 11/2014   a. Mild Conc LVH; EF 45-50%, Mild Inferior HK. Grade 1 DD;; b. EF 55-60%. No RWMA. Moderate RV dilation but normal function.   _____________  Current Outpatient Medications  Medication Sig Dispense Refill  . fenofibrate 160 MG tablet Take 1 tablet (160 mg total) by mouth daily. NEEDS APPOINTMENT FOR FUTURE REFILLS 90 tablet 3  . folic acid (FOLVITE) 299 MCG tablet TAKE 1 TABLET BY MOUTH  DAILY 90 tablet 3  . glipiZIDE (GLUCOTROL) 5 MG tablet TAKE 1 TABLET BY MOUTH  DAILY  BEFORE BREAKFAST 90 tablet 3  . icosapent Ethyl (VASCEPA) 1 g capsule Take 2 capsules (2 g total) by mouth 2 (two) times daily. 360 capsule 3  . Insulin Glargine (LANTUS SOLOSTAR) 100 UNIT/ML Solostar Pen INJECT UP TO 60 UNITS SUBCUTANOUSLY ONCE DAILY 5 pen PRN  . Insulin Pen Needle (B-D UF [name] MINI PEN NEEDLES) 31G X 5 MM MISC USE AS DIRECTED WITH INSULIN PEN 100 each 3  . lisinopril (ZESTRIL) 2.5 MG tablet Take 1 tablet (2.5 mg total) by mouth daily. NEEDS APPOINTMENT FOR FUTURE REFILLS 90 tablet 3  .  metFORMIN (GLUCOPHAGE) 500 MG tablet TAKE 1 TABLET BY MOUTH  TWICE DAILY WITH MEALS 180 tablet 3  . metoprolol tartrate (LOPRESSOR) 25 MG tablet Take 1 tablet (25 mg total) by mouth 2 (two) times daily. 180 tablet 3  . Na Sulfate-K Sulfate-Mg Sulf (SUPREP BOWEL PREP KIT) 17.5-3.13-1.6 GM/177ML SOLN Take 1 kit by mouth as directed. For colonoscopy prep 354 mL 0  . nitroGLYCERIN (NITROSTAT) 0.4 MG SL tablet Place 1 tablet (0.4 mg total) under the tongue every 5 (five) minutes as needed for chest pain. 25 tablet 12  . ONETOUCH DELICA LANCETS 99B MISC Use as instructed to check blood sugar once daily or as needed.  Diagnosis:  E11.49  Non insulin dependent. 100 each 3  . ONETOUCH VERIO test strip TEST ONCE DAILY AND AS NEEDED. 50 strip 11  . rosuvastatin (CRESTOR) 40 MG tablet Take 1 tablet (40 mg total) by mouth daily. MUST KEEP APPOINTMENT 12/06/19 WITH DR HARDING FOR FUTURE REFILLS 90 tablet 3  . ticagrelor (BRILINTA) 60 MG TABS tablet Take 1 tablet (60 mg total) by mouth 2 (two) times daily. NEED OV FOR FUTURE REFILL 180 tablet 3  . vitamin B-12 (CYANOCOBALAMIN) 1000 MCG tablet Take 1 tablet (1,000 mcg total) by mouth daily.    . vitamin B-6 (PYRIDOXINE) 25 MG tablet Take 1 tablet (25 mg total) by mouth daily.     No current facility-administered medications for this visit.   _____________   Allergies:   Metformin and related  _____________   Social History:  The patient  reports that he quit smoking about 6 years ago. His smoking use included cigarettes. He has a 10.00 pack-year smoking history. He has never used smokeless tobacco. He reports current alcohol use. He reports that he does not use drugs.  _____________   Family History:  The patient's family history includes Cancer in his father and mother; Colon cancer in his father; Diabetes in his father; Hearing loss in his brother; Heart disease in his paternal grandfather; Hyperlipidemia in his father; Hypertension in his father.    _____________   ROS:  Please see the history of present illness.    All other systems are reviewed and negative.  _____________   PHYSICAL EXAM: VS:  There were no vitals taken for this visit. , BMI There is no height or weight on file to calculate BMI. GEN: Well nourished, well developed, in no acute distress  HEENT: normal  Neck: no JVD, carotid bruits, or masses Cardiac: RRR; no murmurs, rubs, or gallops. No clubbing, cyanosis, edema.  Radials/DP/PT 2+ and equal bilaterally.  Respiratory:  clear to auscultation bilaterally, normal work of breathing GI: soft, nontender, nondistended, + BS MS: no deformity or atrophy  Skin: warm and dry, no rash Neuro:  Strength and sensation are intact Psych: euthymic mood, full affect _____________  EKG:   The ekg ordered today shows NSR, nonspecific T wave changes  Recent Labs: 03/02/2020: ALT 30; BUN 14; Creatinine, Ser 0.77; Hemoglobin 13.7; Platelets 233.0; Potassium 4.5; Sodium 134; TSH 1.06  03/02/2020: Cholesterol 145; Direct LDL 72.0; HDL 32.10; Total CHOL/HDL Ratio 5; Triglycerides 221.0; VLDL 44.2  CrCl cannot be calculated (Patient's most recent lab result is older than the maximum 21 days allowed.).  Wt Readings from Last 3 Encounters:  08/25/20 219 lb (99.3 kg)  08/01/20 218 lb 6 oz (99.1 kg)  03/09/20 223 lb 2 oz (101.2 kg)    Cardiac cath 07/2018  Right dominant coronary anatomy with widely patent stent in the mid RCA.  No significant obstruction is noted in the right coronary.  Normal left main.  Tortuous but widely patent LAD that wraps around the left ventricular apex.  Normal circumflex with tortuous obtuse marginal branches.  Inferior wall hypokinesis, LVEF 55%, with normal left ventricular end-diastolic pressure.  RECOMMENDATIONS:   Continue aggressive risk factor modification  Consider discontinuation of Brilinta (and possibly adding Plavix) to decrease bleeding risk going forward.  Recommend Aspirin $RemoveBeforeDEI'81mg'[id number]$   daily for moderate CAD. Coronary Diagrams  Diagnostic Dominance: Right     Echo 2015 -------------------------------------------------------------------  Study Conclusions   - Left ventricle: The cavity size was normal. Wall thickness was  normal. Systolic function was normal. The estimated ejection  fraction was in the range of 55% to 60%. Wall motion was normal;  there were no regional wall motion abnormalities.  - Mitral valve: There was trivial regurgitation.  - Right ventricle: The cavity size was moderately dilated. Wall  thickness was normal.     _____________   ASSESSMENT AND PLAN:  CAD s/p RCA stent in 2015 Last cath in 2019 showing patent stent. Patient denies anginal symptoms. He has not needed SL Nitro. EKG stable today. Per Dr. Ellyn Hack note 12/09/19 Okay to hold Brilinta 5-7 days pre-op for surgeries or procedures.   Hypertriglyceridemia Continue Fenofibrate and Vascepa. Increased to 2g twice daily.  Follows with Dr. Debara Pickett.   Hypertension BP today 107/62. Continue metoprolol and lisinopril.   Pre-op evaluation Patient is stable from a cardiac perspective to undergo colonoscopy. He can complete 4 METS. According to Cardiac revised Index he is Class [name] Risk, 10% for 30 day risk of death, MI, or cardiac arrest. As stated above okay to hold Brilinta 5-7 days prop for surgeries or procedures. Re-start Brilinta post-procedure. Will send to provider and fax through epic.    Disposition:   FU with Dr.    Weston Brass, Tymier Lindholm Ninfa Meeker, PA-C 10/02/2020 7:24 PM    _____________ [facility] [address] [place] [address]  [phone] (office) [phone] (fax)

## 2020-10-03 ENCOUNTER — Other Ambulatory Visit: Payer: Self-pay

## 2020-10-03 ENCOUNTER — Other Ambulatory Visit: Payer: Self-pay | Admitting: Cardiology

## 2020-10-03 ENCOUNTER — Ambulatory Visit (INDEPENDENT_AMBULATORY_CARE_PROVIDER_SITE_OTHER): Payer: 59 | Admitting: Cardiology

## 2020-10-03 ENCOUNTER — Encounter: Payer: Self-pay | Admitting: Cardiology

## 2020-10-03 VITALS — BP 107/62 | HR 68 | Ht 70.5 in | Wt 219.8 lb

## 2020-10-03 DIAGNOSIS — I10 Essential (primary) hypertension: Secondary | ICD-10-CM | POA: Diagnosis not present

## 2020-10-03 DIAGNOSIS — E782 Mixed hyperlipidemia: Secondary | ICD-10-CM

## 2020-10-03 DIAGNOSIS — Z01818 Encounter for other preprocedural examination: Secondary | ICD-10-CM

## 2020-10-03 DIAGNOSIS — I2510 Atherosclerotic heart disease of native coronary artery without angina pectoris: Secondary | ICD-10-CM

## 2020-10-03 MED ORDER — LISINOPRIL 2.5 MG PO TABS: 2.5000 mg | ORAL_TABLET | Freq: Every day | ORAL | 3 refills | Status: DC

## 2020-10-03 MED ORDER — ICOSAPENT ETHYL 1 G PO CAPS: 2.0000 g | ORAL_CAPSULE | Freq: Two times a day (BID) | ORAL | 3 refills | Status: DC

## 2020-10-03 MED ORDER — NITROGLYCERIN 0.4 MG SL SUBL: 0.4000 mg | SUBLINGUAL_TABLET | SUBLINGUAL | 12 refills | Status: DC | PRN

## 2020-10-03 MED ORDER — TICAGRELOR 60 MG PO TABS: 60.0000 mg | ORAL_TABLET | Freq: Two times a day (BID) | ORAL | 3 refills | Status: DC

## 2020-10-03 MED ORDER — ROSUVASTATIN CALCIUM 40 MG PO TABS: 40.0000 mg | ORAL_TABLET | Freq: Every day | ORAL | 3 refills | Status: DC

## 2020-10-03 MED ORDER — METOPROLOL TARTRATE 25 MG PO TABS
25.0000 mg | ORAL_TABLET | Freq: Two times a day (BID) | ORAL | 3 refills | Status: DC
Start: 2020-10-03 — End: 2020-10-04

## 2020-10-03 NOTE — Telephone Encounter (Signed)
   Primary Cardiologist: Glenetta Hew, MD  Chart reviewed as part of pre-operative protocol coverage. Patient seen in clinic 10/03/20 and denies anginal symptoms. Given past medical history and time since last visit, based on ACC/AHA guidelines, [name] [name] would be at acceptable risk for the planned procedure without further cardiovascular testing.   Per Dr. Allison Quarry note 12/09/19 "Okay to hold Brilinta 5-7 days preop fr surgeries or procedures".   The patient was advised that if he develops new symptoms prior to surgery to contact our office to arrange for a follow-up visit, and he verbalized understanding.  I will route this recommendation to the requesting party via Epic fax function and remove from pre-op pool.  Please call with questions.  Yarisa Lynam Ninfa Meeker, PA-C 10/03/2020, 12:19 PM

## 2020-10-03 NOTE — Patient Instructions (Signed)
Medication Instructions:  Continue current medications  *If you need a refill on your cardiac medications before your next appointment, please call your pharmacy*   Lab Work: None Ordered  Testing/Procedures: None Ordered   Follow-Up: At [facility], you and your health needs are our priority.  As part of our continuing mission to provide you with exceptional heart care, we have created designated Provider Care Teams.  These Care Teams include your primary Cardiologist (physician) and Advanced Practice Providers (APPs -  Physician Assistants and Nurse Practitioners) who all work together to provide you with the care you need, when you need it.  We recommend signing up for the patient portal called "MyChart".  Sign up information is provided on this After Visit Summary.  MyChart is used to connect with patients for Virtual Visits (Telemedicine).  Patients are able to view lab/test results, encounter notes, upcoming appointments, etc.  Non-urgent messages can be sent to your provider as well.   To learn more about what you can do with MyChart, go to [email or web address].    Your next appointment:   1 year(s)  The format for your next appointment:   In Person  Provider:   You may see Glenetta Hew, MD or one of the following Advanced Practice Providers on your designated Care Team:    Rosaria Ferries, PA-C  [name], DNP, ANP

## 2020-10-12 ENCOUNTER — Other Ambulatory Visit: Payer: Self-pay

## 2020-10-12 ENCOUNTER — Ambulatory Visit (AMBULATORY_SURGERY_CENTER): Payer: 59 | Admitting: Gastroenterology

## 2020-10-12 ENCOUNTER — Encounter: Payer: Self-pay | Admitting: Gastroenterology

## 2020-10-12 VITALS — BP 106/59 | HR 59 | Temp 97.1°F | Resp 16 | Ht 70.0 in | Wt 219.0 lb

## 2020-10-12 DIAGNOSIS — Z8371 Family history of colonic polyps: Secondary | ICD-10-CM | POA: Diagnosis not present

## 2020-10-12 DIAGNOSIS — Z1211 Encounter for screening for malignant neoplasm of colon: Secondary | ICD-10-CM

## 2020-10-12 DIAGNOSIS — D122 Benign neoplasm of ascending colon: Secondary | ICD-10-CM

## 2020-10-12 DIAGNOSIS — D120 Benign neoplasm of cecum: Secondary | ICD-10-CM

## 2020-10-12 DIAGNOSIS — D128 Benign neoplasm of rectum: Secondary | ICD-10-CM | POA: Diagnosis not present

## 2020-10-12 DIAGNOSIS — D125 Benign neoplasm of sigmoid colon: Secondary | ICD-10-CM

## 2020-10-12 DIAGNOSIS — D124 Benign neoplasm of descending colon: Secondary | ICD-10-CM

## 2020-10-12 DIAGNOSIS — D123 Benign neoplasm of transverse colon: Secondary | ICD-10-CM

## 2020-10-12 MED ORDER — SODIUM CHLORIDE 0.9 % IV SOLN: 500.0000 mL | Freq: Once | INTRAVENOUS | Status: DC

## 2020-10-12 NOTE — Op Note (Signed)
[facility] Patient Name: [name] [name] Procedure Date: 10/12/2020 7:50 AM MRN: [id number] Endoscopist: Justice Britain , MD Age: 59 Referring MD:  Date of Birth: [birth year] Gender: Male Account #: [id number] Procedure:                Colonoscopy Indications:              Colon cancer screening in patient with 1st-degree                            relative having advanced adenoma of the colon Medicines:                Monitored Anesthesia Care Procedure:                Pre-Anesthesia Assessment:                           - Prior to the procedure, a History and Physical                            was performed, and patient medications and                            allergies were reviewed. The patient's tolerance of                            previous anesthesia was also reviewed. The risks                            and benefits of the procedure and the sedation                            options and risks were discussed with the patient.                            All questions were answered, and informed consent                            was obtained. Prior Anticoagulants: The patient has                            taken Brillanta, last dose was 3 days prior to                            procedure. ASA Grade Assessment: III - A patient                            with severe systemic disease. After reviewing the                            risks and benefits, the patient was deemed in                            satisfactory condition to undergo the procedure.  After obtaining informed consent, the colonoscope                            was passed under direct vision. Throughout the                            procedure, the patient's blood pressure, pulse, and                            oxygen saturations were monitored continuously. The                            Colonoscope was introduced through the anus and                            advanced  to the 5 cm into the ileum. The                            colonoscopy was performed without difficulty. The                            patient tolerated the procedure. The quality of the                            bowel preparation was adequate. The terminal ileum,                            ileocecal valve, appendiceal orifice, and rectum                            were photographed. Scope In: 7:59:56 AM Scope Out: 8:25:29 AM Scope Withdrawal Time: 0 hours 23 minutes 0 seconds  Total Procedure Duration: 0 hours 25 minutes 33 seconds  Findings:                 The digital rectal exam findings include                            hemorrhoids. Pertinent negatives include no                            palpable rectal lesions.                           The terminal ileum and ileocecal valve appeared                            normal.                           Nine sessile polyps were found in the recto-sigmoid                            colon (2), sigmoid colon (1), descending colon (1),  hepatic flexure (3), ascending colon (1) and cecum                            (1). The polyps were 2 to 5 mm in size. These                            polyps were removed with a cold snare. Resection                            and retrieval were complete.                           Normal mucosa was found in the entire colon                            otherwise.                           Non-bleeding non-thrombosed external and internal                            hemorrhoids were found during retroflexion, during                            perianal exam and during digital exam. The                            hemorrhoids were Grade II (internal hemorrhoids                            that prolapse but reduce spontaneously). Complications:            No immediate complications. Estimated Blood Loss:     Estimated blood loss was minimal. Impression:               - Hemorrhoids found on  digital rectal exam.                           - The examined portion of the ileum was normal.                           - Nine 2 to 5 mm polyps at the recto-sigmoid colon,                            in the sigmoid colon, in the descending colon, at                            the hepatic flexure, in the ascending colon and in                            the cecum, removed with a cold snare. Resected and                            retrieved.                           -  Normal mucosa in the entire examined colon.                           - Non-bleeding non-thrombosed external and internal                            hemorrhoids. Recommendation:           - The patient will be observed post-procedure,                            until all discharge criteria are met.                           - Discharge patient to home.                           - Patient has a contact number available for                            emergencies. The signs and symptoms of potential                            delayed complications were discussed with the                            patient. Return to normal activities tomorrow.                            Written discharge instructions were provided to the                            patient.                           - High fiber diet.                           - Use FiberCon 1-2 tablets PO daily.                           - May restart Brillanta no sooner than 72 hours                            (11/14 AM) to decrease risk of post-interventional                            bleeding.                           - Continue present medications.                           - Await pathology results.                           - Repeat colonoscopy in 3 years for surveillance  based on pathology results and findings of                            adenomatous tissue, otherwise will be based on                            family history of colon cancer  and return in                            5-years.                           - The findings and recommendations were discussed                            with the patient. Justice Britain, MD 10/12/2020 8:34:08 AM

## 2020-10-12 NOTE — Progress Notes (Signed)
Report given to PACU, vss 

## 2020-10-12 NOTE — Patient Instructions (Signed)
Impression/Recommendations:  Polyp, high fiber,  and hemorrhoid handouts given to patient.  Use FiberCon 1-2 tablets by mouth daily.  Restart Brillanta no sooner than 72 hours (Nov. 14), to decrease risk of post-procedure bleeding.  Continue present medications. Await pathology results.  Repeat colonoscopy for surveillance.  Date to be determined after pathology results reviewed.  YOU HAD AN ENDOSCOPIC PROCEDURE TODAY AT [facility]:   Refer to the procedure report that was given to you for any specific questions about what was found during the examination.  If the procedure report does not answer your questions, please call your gastroenterologist to clarify.  If you requested that your care partner not be given the details of your procedure findings, then the procedure report has been included in a sealed envelope for you to review at your convenience later.  YOU SHOULD EXPECT: Some feelings of bloating in the abdomen. Passage of more gas than usual.  Walking can help get rid of the air that was put into your GI tract during the procedure and reduce the bloating. If you had a lower endoscopy (such as a colonoscopy or flexible sigmoidoscopy) you may notice spotting of blood in your stool or on the toilet paper. If you underwent a bowel prep for your procedure, you may not have a normal bowel movement for a few days.  Please Note:  You might notice some irritation and congestion in your nose or some drainage.  This is from the oxygen used during your procedure.  There is no need for concern and it should clear up in a day or so.  SYMPTOMS TO REPORT IMMEDIATELY:   Following lower endoscopy (colonoscopy or flexible sigmoidoscopy):  Excessive amounts of blood in the stool  Significant tenderness or worsening of abdominal pains  Swelling of the abdomen that is new, acute  Fever of 100F or higher For urgent or emergent issues, a gastroenterologist can be reached at any hour by  calling [phone]. Do not use MyChart messaging for urgent concerns.    DIET:  We do recommend a small meal at first, but then you may proceed to your regular diet.  Drink plenty of fluids but you should avoid alcoholic beverages for 24 hours.  ACTIVITY:  You should plan to take it easy for the rest of today and you should NOT DRIVE or use heavy machinery until tomorrow (because of the sedation medicines used during the test).    FOLLOW UP: Our staff will call the number listed on your records 48-72 hours following your procedure to check on you and address any questions or concerns that you may have regarding the information given to you following your procedure. If we do not reach you, we will leave a message.  We will attempt to reach you two times.  During this call, we will ask if you have developed any symptoms of COVID 19. If you develop any symptoms (ie: fever, flu-like symptoms, shortness of breath, cough etc.) before then, please call [phone].  If you test positive for Covid 19 in the 2 weeks post procedure, please call and report this information to [place].    If any biopsies were taken you will be contacted by phone or by letter within the next 1-3 weeks.  Please call us at [phone] if you have not heard about the biopsies in 3 weeks.    SIGNATURES/CONFIDENTIALITY: You and/or your care partner have signed paperwork which will be entered into your electronic medical record.  These signatures attest to the fact that that the information above on your After Visit Summary has been reviewed and is understood.  Full responsibility of the confidentiality of this discharge information lies with you and/or your care-partner.

## 2020-10-12 NOTE — Progress Notes (Signed)
Called to room to assist during endoscopic procedure.  Patient ID and intended procedure confirmed with present staff. Received instructions for my participation in the procedure from the performing physician.  

## 2020-10-12 NOTE — Progress Notes (Signed)
Vitals-DC  History reviewed.  Denies [place]. Bladder cancer.  Pt had only stopped Brilenta for 3 days.  Dr stated that he would be able to do the procedure. However, if there is a big polyp he won't be able to take it out due to the bleeding possibility.  Then, pt would need to come back.  The patient agreed to go on with the procedure.

## 2020-10-16 ENCOUNTER — Telehealth: Payer: Self-pay | Admitting: *Deleted

## 2020-10-16 NOTE — Telephone Encounter (Signed)
  Follow up Call-  Call back number 10/12/2020  Post procedure Call Back phone  # [phone]  Permission to leave phone message Yes  Some recent data might be hidden     Patient questions:  Do you have a fever, pain , or abdominal swelling? No. Pain Score  0 *  Have you tolerated food without any problems? Yes.    Have you been able to return to your normal activities? Yes.    Do you have any questions about your discharge instructions: Diet   No. Medications  No. Follow up visit  No.  Do you have questions or concerns about your Care? No.  Actions: * If pain score is 4 or above: 1. No action needed, pain <4.Have you developed a fever since your procedure? no  2.   Have you had an respiratory symptoms (SOB or cough) since your procedure? no  3.   Have you tested positive for COVID 19 since your procedure no  4.   Have you had any family members/close contacts diagnosed with the COVID 19 since your procedure?  no   If yes to any of these questions please route to Joylene John, RN and Joella Prince, RN

## 2020-10-17 ENCOUNTER — Encounter: Payer: Self-pay | Admitting: Gastroenterology

## 2020-10-30 ENCOUNTER — Other Ambulatory Visit: Payer: Self-pay | Admitting: Cardiology

## 2020-11-06 ENCOUNTER — Other Ambulatory Visit: Payer: Self-pay | Admitting: Family Medicine

## 2020-11-07 ENCOUNTER — Encounter: Payer: Self-pay | Admitting: Family Medicine

## 2020-11-30 ENCOUNTER — Other Ambulatory Visit: Payer: Self-pay | Admitting: Cardiology

## 2020-12-05 ENCOUNTER — Other Ambulatory Visit: Payer: Self-pay

## 2020-12-05 ENCOUNTER — Ambulatory Visit (INDEPENDENT_AMBULATORY_CARE_PROVIDER_SITE_OTHER): Payer: 59 | Admitting: Family Medicine

## 2020-12-05 ENCOUNTER — Encounter: Payer: Self-pay | Admitting: Family Medicine

## 2020-12-05 VITALS — BP 138/70 | HR 83 | Temp 96.4°F | Ht 70.5 in | Wt 222.3 lb

## 2020-12-05 DIAGNOSIS — E1149 Type 2 diabetes mellitus with other diabetic neurological complication: Secondary | ICD-10-CM

## 2020-12-05 LAB — POCT GLYCOSYLATED HEMOGLOBIN (HGB A1C): Hemoglobin A1C: 7.8 % — AB (ref 4.0–5.6)

## 2020-12-05 NOTE — Patient Instructions (Addendum)
I would get a covid booster.   Check with your insurance to see if they will cover the shingles shot. Let me check on options for the vascepa.   Cut back on insulin if you have a low sugar.   Recheck in about 3-4 months with labs ahead of a physical. Take care.  Glad to see you.

## 2020-12-05 NOTE — Progress Notes (Signed)
This visit occurred during the SARS-CoV-2 public health emergency.  Safety protocols were in place, including screening questions prior to the visit, additional usage of staff PPE, and extensive cleaning of exam room while observing appropriate contact time as indicated for disinfecting solutions.  Diabetes:  Using medications without difficulties: see below.   Hypoglycemic episodes: see below.   Hyperglycemic episodes: no Feet problems: tingling at baseline, variable but not worse than prev.   Blood Sugars averaging: see below.   eye exam within last year: yes a1c d/w pt at [facility].    He is cutting his insulin by 1 unit if sugar below 120.  Adding 1 unit if above 160.    He was able to dec down to 54 units of insulin.  Sugar was 102 and he felt weaker this AM.  Had been up to 180 recently.  He had prev gotten down to 52 units.    He needs help with vascepa coverage.  He is out of med now.  I told him I will check on options.  shingrix d/w pt.  See avs.  COVID vaccine booster encouraged.  Discussed.  Meds, vitals, and allergies reviewed.  ROS: Per HPI unless specifically indicated in ROS section   GEN: nad, alert and oriented HEENT: ncat NECK: supple w/o LA CV: rrr. PULM: ctab, no inc wob ABD: soft, +bs EXT: no edema SKIN: no acute rash

## 2020-12-07 ENCOUNTER — Telehealth: Payer: Self-pay | Admitting: Family Medicine

## 2020-12-07 NOTE — Assessment & Plan Note (Signed)
His A1c is up slightly but he is also on a lower dose of insulin.  He is been working on his diet.  We talked about adjusting his insulin if needed.  See above.  I will check with pharmacy about Vascepa coverage.  I think it makes sense to continue with diet and exercise and recheck in a few months.  He agrees.

## 2020-12-07 NOTE — Telephone Encounter (Signed)
Neither [facility] or [facility] are currently accepting applications. There is a GoodRx coupon - [employer] [phone] for a 30 day supply. Unfortunately, those are the only options I know of.   Thanks,  Phil Dopp, PharmD Clinical Pharmacist [facility] [phone]

## 2020-12-07 NOTE — Telephone Encounter (Signed)
He needs help with vascepa coverage.  He is out of med now.  If there way to help him get coverage with this?  I appreciate your input.

## 2020-12-11 NOTE — Telephone Encounter (Signed)
See below.  If the patient cannot get Vascepa filled with a good Rx coupon, then it might make sense to try generic over-the-counter fish oil.  That may be a lot cheaper and if he decides to go that route then we can recheck his triglycerides and see what his levels are.  Please let me know which way he wants to go.  Thanks.

## 2020-12-12 ENCOUNTER — Telehealth: Payer: Self-pay | Admitting: Family Medicine

## 2020-12-12 NOTE — Telephone Encounter (Signed)
Called and spoke with patient to get an update on his status. Patient stated that he is doing ok and feeling much better. Patient reported that on Thursday he had an extreme headache and fatigue, which warranted him to take an at home test on Friday, which was positive. Patient stated that yesterday (12/11/2020) he took a PCR test and is currently waiting on the results. Patient reports only having symptoms of fatigue and a dry cough. Patient denies fever or SOB. Overall, he states that he is feeling well. Instructed patient that if his symptoms get worse, to call back.

## 2020-12-12 NOTE — Telephone Encounter (Signed)
Noted. Thanks.

## 2020-12-12 NOTE — Telephone Encounter (Signed)
LVM for pt to cb to the office so that we can discuss his options regarding Vascepa.  [name] [name]

## 2020-12-12 NOTE — Telephone Encounter (Signed)
Please call and check on patient/triage patient.  Per father's report, patient was sick and I needed an update.  Thanks.

## 2020-12-13 MED ORDER — FISH OIL 1000 MG PO CAPS: 2.0000 | ORAL_CAPSULE | Freq: Two times a day (BID) | ORAL | Status: DC

## 2020-12-13 NOTE — Telephone Encounter (Signed)
Noted. Thanks.

## 2020-12-13 NOTE — Telephone Encounter (Signed)
Pt returned your call and would like a call back on his cell phone.

## 2020-12-13 NOTE — Addendum Note (Signed)
Addended by: Tonia Ghent on: 12/13/2020 01:32 PM   Modules accepted: Orders

## 2020-12-13 NOTE — Telephone Encounter (Signed)
Spoke with pt and advised him of your recommendations. He will try the regular fish oil OTC..  Thank you,  Leamon Arnt

## 2020-12-18 ENCOUNTER — Encounter: Payer: Self-pay | Admitting: Family Medicine

## 2020-12-19 NOTE — Telephone Encounter (Signed)
See other mychart message.

## 2021-01-03 ENCOUNTER — Telehealth: Payer: Self-pay | Admitting: Family Medicine

## 2021-01-03 NOTE — Telephone Encounter (Signed)
Anne Ng called from [facility] stating that they received a request for medical records back but they do not have a patient with that date of birth.

## 2021-01-04 NOTE — Telephone Encounter (Signed)
Noted  

## 2021-03-26 ENCOUNTER — Other Ambulatory Visit: Payer: Self-pay | Admitting: Family Medicine

## 2021-03-26 DIAGNOSIS — E1149 Type 2 diabetes mellitus with other diabetic neurological complication: Secondary | ICD-10-CM

## 2021-03-27 ENCOUNTER — Other Ambulatory Visit (INDEPENDENT_AMBULATORY_CARE_PROVIDER_SITE_OTHER): Payer: 59

## 2021-03-27 ENCOUNTER — Other Ambulatory Visit: Payer: Self-pay

## 2021-03-27 DIAGNOSIS — E1149 Type 2 diabetes mellitus with other diabetic neurological complication: Secondary | ICD-10-CM | POA: Diagnosis not present

## 2021-03-27 LAB — CBC WITH DIFFERENTIAL/PLATELET
Basophils Absolute: 0 10*3/uL (ref 0.0–0.1)
Basophils Relative: 0.6 % (ref 0.0–3.0)
Eosinophils Absolute: 0.2 10*3/uL (ref 0.0–0.7)
Eosinophils Relative: 2.8 % (ref 0.0–5.0)
HCT: 39.6 % (ref 39.0–52.0)
Hemoglobin: 13.4 g/dL (ref 13.0–17.0)
Lymphocytes Relative: 46.1 % — ABNORMAL HIGH (ref 12.0–46.0)
Lymphs Abs: 2.8 10*3/uL (ref 0.7–4.0)
MCHC: 33.9 g/dL (ref 30.0–36.0)
MCV: 84.8 fl (ref 78.0–100.0)
Monocytes Absolute: 0.5 10*3/uL (ref 0.1–1.0)
Monocytes Relative: 8.4 % (ref 3.0–12.0)
Neutro Abs: 2.5 10*3/uL (ref 1.4–7.7)
Neutrophils Relative %: 42.1 % — ABNORMAL LOW (ref 43.0–77.0)
Platelets: 259 10*3/uL (ref 150.0–400.0)
RBC: 4.67 Mil/uL (ref 4.22–5.81)
RDW: 13.6 % (ref 11.5–15.5)
WBC: 6 10*3/uL (ref 4.0–10.5)

## 2021-03-27 LAB — COMPREHENSIVE METABOLIC PANEL
ALT: 32 U/L (ref 0–53)
AST: 25 U/L (ref 0–37)
Albumin: 4.2 g/dL (ref 3.5–5.2)
Alkaline Phosphatase: 38 U/L — ABNORMAL LOW (ref 39–117)
BUN: 13 mg/dL (ref 6–23)
CO2: 28 mEq/L (ref 19–32)
Calcium: 9.4 mg/dL (ref 8.4–10.5)
Chloride: 103 mEq/L (ref 96–112)
Creatinine, Ser: 0.91 mg/dL (ref 0.40–1.50)
GFR: 91.05 mL/min (ref >60.00)
Glucose, Bld: 122 mg/dL — ABNORMAL HIGH (ref 70–99)
Potassium: 4.8 mEq/L (ref 3.5–5.1)
Sodium: 137 mEq/L (ref 135–145)
Total Bilirubin: 0.4 mg/dL (ref 0.2–1.2)
Total Protein: 6.8 g/dL (ref 6.0–8.3)

## 2021-03-27 LAB — LIPID PANEL
Cholesterol: 134 mg/dL (ref 0–200)
HDL: 34.2 mg/dL — ABNORMAL LOW (ref >39.00)
NonHDL: 99.39
Total CHOL/HDL Ratio: 4
Triglycerides: 224 mg/dL — ABNORMAL HIGH (ref 0.0–149.0)
VLDL: 44.8 mg/dL — ABNORMAL HIGH (ref 0.0–40.0)

## 2021-03-27 LAB — TSH: TSH: 1.49 u[IU]/mL (ref 0.35–4.50)

## 2021-03-27 LAB — HEMOGLOBIN A1C: Hgb A1c MFr Bld: 7.8 % — ABNORMAL HIGH (ref 4.6–6.5)

## 2021-03-27 LAB — VITAMIN B12: Vitamin B-12: 561 pg/mL (ref 211–911)

## 2021-03-27 LAB — LDL CHOLESTEROL, DIRECT: Direct LDL: 73 mg/dL

## 2021-04-05 ENCOUNTER — Encounter: Payer: 59 | Admitting: Family Medicine

## 2021-04-10 ENCOUNTER — Ambulatory Visit (INDEPENDENT_AMBULATORY_CARE_PROVIDER_SITE_OTHER): Payer: 59 | Admitting: Family Medicine

## 2021-04-10 ENCOUNTER — Other Ambulatory Visit: Payer: Self-pay

## 2021-04-10 ENCOUNTER — Encounter: Payer: Self-pay | Admitting: Family Medicine

## 2021-04-10 VITALS — BP 118/70 | HR 78 | Temp 97.5°F | Ht 71.0 in | Wt 217.0 lb

## 2021-04-10 DIAGNOSIS — Z0000 Encounter for general adult medical examination without abnormal findings: Secondary | ICD-10-CM | POA: Diagnosis not present

## 2021-04-10 DIAGNOSIS — E782 Mixed hyperlipidemia: Secondary | ICD-10-CM

## 2021-04-10 DIAGNOSIS — E1149 Type 2 diabetes mellitus with other diabetic neurological complication: Secondary | ICD-10-CM

## 2021-04-10 DIAGNOSIS — Z7189 Other specified counseling: Secondary | ICD-10-CM

## 2021-04-10 MED ORDER — FOLIC ACID 400 MCG PO TABS: 400.0000 ug | ORAL_TABLET | Freq: Every day | ORAL | 3 refills | Status: DC

## 2021-04-10 NOTE — Patient Instructions (Signed)
If the foot pain gets worse, then let me know.  Fill out the grid and we'll go from there.  Plan on recheck in about 3 months with A1c at the visit.   Call about an eye exam.  Take care.  Glad to see you.

## 2021-04-10 NOTE — Progress Notes (Signed)
This visit occurred during the SARS-CoV-2 public health emergency.  Safety protocols were in place, including screening questions prior to the visit, additional usage of staff PPE, and extensive cleaning of exam room while observing appropriate contact time as indicated for disinfecting solutions.  CPE- See plan.  Routine anticipatory guidance given to patient.  See health maintenance.  The possibility exists that previously documented standard health maintenance information may have been brought forward from a previous encounter into this note.  If needed, that same information has been updated to reflect the current situation based on today's encounter.    covid vaccine done x2, d/w pt.   tdap 2015 PNA 2014 Flu prev done Shingles d/w pt.   Prostate cancer screening and PSA options (with potential risks and benefits of testing vs not testing) were discussed along with recent recs/guidelines.  He declined testing PSA at this point. Colonoscopy 2021. Diet and exercise d/w pt.   Living will d/w pt. Wife designated if patient were incapacitated.   Diabetes:  Using medications without difficulties: yes Hypoglycemic episodes: no Hyperglycemic episodes:no Feet problems: some tingling, L foot pain at 1st MTP, able to bear weight.  No redness locally.   Blood Sugars averaging:  Sugar usually ~100-150.   eye exam within last year: due, d/w pt.   He is working more on diet, he cut out salt.  Less fried food.  He notes his clothes fit looser.    Elevated Cholesterol: Using medications without problems: yes Muscle aches: no cramping with walking but some at night at rest, in the L calf.   Diet compliance: yes Exercise: yes  Cyst on back.  Not draining.  If enlarging he'll update up me.    PMH and SH reviewed Meds, vitals, and allergies reviewed.   ROS: Per HPI.  Unless specifically indicated otherwise in HPI, the patient denies:  General: fever. Eyes: acute vision changes ENT: sore  throat Cardiovascular: chest pain Respiratory: SOB GI: vomiting GU: dysuria Musculoskeletal: acute back pain Derm: acute rash Neuro: acute motor dysfunction Psych: worsening mood Endocrine: polydipsia Heme: bleeding Allergy: hayfever  GEN: nad, alert and oriented HEENT: ncat NECK: supple w/o LA CV: rrr. PULM: ctab, no inc wob ABD: soft, +bs EXT: no edema SKIN: no acute rash, but noninfected sebaceous cyst noted on the upper back.  Small, nontender (we can intervene if this gets larger or infected, discussed with patient.  He agrees.)  Diabetic foot exam: Normal inspection No skin breakdown No calluses  Normal DP pulses Normal sensation to light touch and monofilament Nails normal Able to bear weight with normal inspection of the feet.  Left first MTP not tender.

## 2021-04-11 NOTE — Assessment & Plan Note (Signed)
Continue work on diet and exercise.  Continue fenofibrate and Crestor. 

## 2021-04-11 NOTE — Assessment & Plan Note (Signed)
covid vaccine done x2, d/w pt.   ?tdap 2015 ?PNA 2014 ?Flu prev done ?Shingles d/w pt.   ?Prostate cancer screening and PSA options (with potential risks and benefits of testing vs not testing) were discussed along with recent recs/guidelines.  He declined testing PSA at this point. ?Colonoscopy 2021. ?Diet and exercise d/w pt.   ?Living will d/w pt. Wife designated if patient were incapacitated.  ?

## 2021-04-11 NOTE — Assessment & Plan Note (Signed)
Living will d/w pt.  Wife designated if patient were incapacitated.   ?

## 2021-04-11 NOTE — Assessment & Plan Note (Signed)
~  42 units recently.  On lower insulin dose recently.  He'll fill out pre/post grid and let me know about his readings.  We may need to adjust his insulin/medication regimen based on that.  No change in medications at this point.

## 2021-04-13 ENCOUNTER — Encounter: Payer: Self-pay | Admitting: Family Medicine

## 2021-04-20 ENCOUNTER — Other Ambulatory Visit: Payer: Self-pay | Admitting: Family Medicine

## 2021-05-07 ENCOUNTER — Other Ambulatory Visit: Payer: Self-pay | Admitting: Family Medicine

## 2021-06-21 ENCOUNTER — Telehealth: Payer: Self-pay | Admitting: Cardiology

## 2021-06-21 MED ORDER — NITROGLYCERIN 0.4 MG SL SUBL: 0.4000 mg | SUBLINGUAL_TABLET | SUBLINGUAL | 12 refills | Status: DC | PRN

## 2021-06-21 NOTE — Telephone Encounter (Signed)
*  STAT* If patient is at the pharmacy, call can be transferred to refill team.   1. Which medications need to be refilled? (please list name of each medication and dose if known)  nitroGLYCERIN (NITROSTAT) 0.4 MG SL tablet  2. Which pharmacy/location (including street and city if local pharmacy) is medication to be sent to? [facility]  ([place], [place]  3. Do they need a 30 day or 90 day supply? 30 with refills

## 2021-06-28 ENCOUNTER — Other Ambulatory Visit: Payer: Self-pay | Admitting: Family Medicine

## 2021-07-12 ENCOUNTER — Ambulatory Visit: Payer: 59 | Admitting: Family Medicine

## 2021-07-26 ENCOUNTER — Ambulatory Visit (INDEPENDENT_AMBULATORY_CARE_PROVIDER_SITE_OTHER): Payer: 59 | Admitting: Family Medicine

## 2021-07-26 ENCOUNTER — Encounter: Payer: Self-pay | Admitting: Family Medicine

## 2021-07-26 ENCOUNTER — Other Ambulatory Visit: Payer: Self-pay

## 2021-07-26 VITALS — BP 122/78 | HR 78 | Temp 98.0°F | Ht 71.0 in | Wt 218.0 lb

## 2021-07-26 DIAGNOSIS — E1149 Type 2 diabetes mellitus with other diabetic neurological complication: Secondary | ICD-10-CM

## 2021-07-26 LAB — POCT GLYCOSYLATED HEMOGLOBIN (HGB A1C): Hemoglobin A1C: 7.3 % — AB (ref 4.0–5.6)

## 2021-07-26 NOTE — Progress Notes (Signed)
This visit occurred during the SARS-CoV-2 public health emergency.  Safety protocols were in place, including screening questions prior to the visit, additional usage of staff PPE, and extensive cleaning of exam room while observing appropriate contact time as indicated for disinfecting solutions.  Diabetes:  Using medications without difficulties: yes Hypoglycemic episodes:no Hyperglycemic episodes:no Feet problems: no change from prev.   Blood Sugars averaging: 135-140 eye exam within last year: due, d/w pt.   A1c improved.  D/w pt.   Taking ~45 units per day over the last month.   Meds, vitals, and allergies reviewed.   ROS: Per HPI unless specifically indicated in ROS section   GEN: nad, alert and oriented HEENT: ncat NECK: supple w/o LA CV: rrr. PULM: ctab, no inc wob ABD: soft, +bs EXT: no edema SKIN: well perfused.

## 2021-07-26 NOTE — Patient Instructions (Addendum)
I would get a flu shot each fall.   Call about an eye exam when possible.   Take care.  Glad to see you.  Let me check with cardiology about possible using jardiance.  Plan on recheck in about 3-4 months.  A1c at the visit.

## 2021-07-29 NOTE — Assessment & Plan Note (Signed)
See AVS. continue glipizide insulin and metformin for now. I will check with cardiology about possible using jardiance.  We may be able to substitute that for glipizide later on. Plan on recheck in about 3-4 months.  A1c at the visit.

## 2021-08-02 ENCOUNTER — Other Ambulatory Visit: Payer: Self-pay | Admitting: Family Medicine

## 2021-08-07 ENCOUNTER — Other Ambulatory Visit: Payer: Self-pay | Admitting: Family Medicine

## 2021-08-07 MED ORDER — EMPAGLIFLOZIN 10 MG PO TABS: 10.0000 mg | ORAL_TABLET | Freq: Every day | ORAL | 3 refills | Status: DC

## 2021-08-07 NOTE — Telephone Encounter (Signed)
Please check with patient.  I talked with cardiology.  We both think it would reasonable to try substituting Jardiance instead of glipizide.  Please see if he wants me to send the prescription locally or if he wants it sent to mail-order.  I would continue glipizide until he gets the new prescription and then make the change.  He does not have to taper off of glipizide.  He can take his last dose of glipizide one day and then start Jardiance the next.  Jardiance does usually cause an increase in urination.  If he has pain with urination or troubles otherwise then he would need to stop and let me know.  Thanks.

## 2021-08-21 MED ORDER — EMPAGLIFLOZIN 10 MG PO TABS: 10.0000 mg | ORAL_TABLET | Freq: Every day | ORAL | 3 refills | Status: DC

## 2021-08-21 NOTE — Telephone Encounter (Signed)
Noted.  Sent. Thanks.  Med list updated.

## 2021-08-21 NOTE — Telephone Encounter (Signed)
Spoke with patient about changing rx to Jardiance instead of glipizide. Patient is okay with this and wants [facility]. Patient made aware not to stop glipizide until he gets the new rx. Also advised patient that the new medication can cause increase in urination but if he gets pain with urination or any other troubles otherwise to let us know.

## 2021-08-30 ENCOUNTER — Encounter: Payer: Self-pay | Admitting: Family Medicine

## 2021-09-06 ENCOUNTER — Other Ambulatory Visit: Payer: Self-pay | Admitting: Cardiology

## 2021-09-14 ENCOUNTER — Telehealth: Payer: 59 | Admitting: Cardiology

## 2021-09-28 ENCOUNTER — Telehealth (INDEPENDENT_AMBULATORY_CARE_PROVIDER_SITE_OTHER): Payer: 59 | Admitting: Cardiology

## 2021-09-28 ENCOUNTER — Encounter: Payer: Self-pay | Admitting: Cardiology

## 2021-09-28 ENCOUNTER — Telehealth: Payer: Self-pay | Admitting: *Deleted

## 2021-09-28 DIAGNOSIS — E782 Mixed hyperlipidemia: Secondary | ICD-10-CM | POA: Diagnosis not present

## 2021-09-28 DIAGNOSIS — I2510 Atherosclerotic heart disease of native coronary artery without angina pectoris: Secondary | ICD-10-CM

## 2021-09-28 DIAGNOSIS — E1149 Type 2 diabetes mellitus with other diabetic neurological complication: Secondary | ICD-10-CM

## 2021-09-28 DIAGNOSIS — I2119 ST elevation (STEMI) myocardial infarction involving other coronary artery of inferior wall: Secondary | ICD-10-CM

## 2021-09-28 DIAGNOSIS — Z955 Presence of coronary angioplasty implant and graft: Secondary | ICD-10-CM

## 2021-09-28 DIAGNOSIS — I10 Essential (primary) hypertension: Secondary | ICD-10-CM

## 2021-09-28 MED ORDER — TICAGRELOR 60 MG PO TABS
ORAL_TABLET | ORAL | 3 refills | Status: DC
Start: 2021-09-28 — End: 2022-08-13

## 2021-09-28 MED ORDER — ROSUVASTATIN CALCIUM 40 MG PO TABS: 40.0000 mg | ORAL_TABLET | Freq: Every day | ORAL | 3 refills | Status: DC

## 2021-09-28 NOTE — Telephone Encounter (Signed)
  Patient Consent for Virtual Visit        [name] [name] has provided verbal consent on 09/28/2021 for a virtual visit (video or telephone).   CONSENT FOR VIRTUAL VISIT FOR:  [name] [name]  By participating in this virtual visit I agree to the following:  I hereby voluntarily request, consent and authorize [facility] and its employed or contracted physicians, physician assistants, nurse practitioners or other licensed health care professionals (the Practitioner), to provide me with telemedicine health care services (the "Services") as deemed necessary by the treating Practitioner. I acknowledge and consent to receive the Services by the Practitioner via telemedicine. I understand that the telemedicine visit will involve communicating with the Practitioner through live audiovisual communication technology and the disclosure of certain medical information by electronic transmission. I acknowledge that I have been given the opportunity to request an in-person assessment or other available alternative prior to the telemedicine visit and am voluntarily participating in the telemedicine visit.  I understand that I have the right to withhold or withdraw my consent to the use of telemedicine in the course of my care at any time, without affecting my right to future care or treatment, and that the Practitioner or I may terminate the telemedicine visit at any time. I understand that I have the right to inspect all information obtained and/or recorded in the course of the telemedicine visit and may receive copies of available information for a reasonable fee.  I understand that some of the potential risks of receiving the Services via telemedicine include:  Delay or interruption in medical evaluation due to technological equipment failure or disruption; Information transmitted may not be sufficient (e.g. poor resolution of images) to allow for appropriate medical decision making by the  Practitioner; and/or  In rare instances, security protocols could fail, causing a breach of personal health information.  Furthermore, I acknowledge that it is my responsibility to provide information about my medical history, conditions and care that is complete and accurate to the best of my ability. I acknowledge that Practitioner's advice, recommendations, and/or decision may be based on factors not within their control, such as incomplete or inaccurate data provided by me or distortions of diagnostic images or specimens that may result from electronic transmissions. I understand that the practice of medicine is not an exact science and that Practitioner makes no warranties or guarantees regarding treatment outcomes. I acknowledge that a copy of this consent can be made available to me via my patient portal ([facility]), or I can request a printed copy by calling the office of [facility].    I understand that my insurance will be billed for this visit.   I have read or had this consent read to me. I understand the contents of this consent, which adequately explains the benefits and risks of the Services being provided via telemedicine.  I have been provided ample opportunity to ask questions regarding this consent and the Services and have had my questions answered to my satisfaction. I give my informed consent for the services to be provided through the use of telemedicine in my medical care

## 2021-09-28 NOTE — Telephone Encounter (Signed)
RN spoke to patient. Instruction were given  from today's virtual visit 09/28/21 .  AVS SUMMARY has been sent by mychart .   Patient verbalized understanding

## 2021-09-28 NOTE — Progress Notes (Signed)
Virtual Visit via Telephone Note   This visit type was conducted due to national recommendations for restrictions regarding the COVID-19 Pandemic (e.g. social distancing) in an effort to limit this patient's exposure and mitigate transmission in our community.  Due to his co-morbid illnesses, this patient is at least at moderate risk for complications without adequate follow up.  This format is felt to be most appropriate for this patient at this time.  The patient did not have access to video technology/had technical difficulties with video requiring transitioning to audio format only (telephone).  All issues noted in this document were discussed and addressed.  No physical exam could be performed with this format.  Please refer to the patient's chart for his  consent to telehealth for [facility].   Patient has given verbal permission to conduct this visit via virtual appointment and to bill insurance 09/29/2021 9:30 PM     Evaluation Performed:  Follow-up visit  Date:  09/29/2021   ID:  [name] [name], DOB 1961/01/13, MRN [id number]  Patient Location: Home Provider Location: Home Office  PCP:  Tonia Ghent, MD  Cardiologist:  Glenetta Hew, MD Electrophysiologist:  None   Chief Complaint:   Chief Complaint  Patient presents with   Follow-up    Annual.   Coronary Artery Disease    History of MI, RCA PCI.  No recurrent anginal pain     ====================================  ASSESSMENT & PLAN:    Problem List Items Addressed This Visit       Cardiology Problems   Coronary artery disease involving native heart without angina pectoris (Chronic)    Single Vessel CAD with PCI to RCA -> no recurrent anginal symptoms on stable medications.:  Lopressor and lisinopril along with rosuvastatin and fenofibrate.  Remains on maintenance dose Brilinta 60 mg twice daily.  No bleeding issues, will continue as is. -> Okay to hold Brilinta 5 days preop for surgeries or  procedures. (7 days preop for more high risk procedures)      Relevant Medications   rosuvastatin (CRESTOR) 40 MG tablet   ST elevation myocardial infarction (STEMI) of inferior wall, subsequent episode of care ([place]) (Chronic)    6-1/2 years out from inferior STEMI.  No further angina, and EF on follow-up echocardiogram showed preserved function improved from baseline post MI echo.  No current symptoms of angina or dyspnea.  He is very active with work, but not doing routine exercise.  All stable regimen.      Relevant Medications   rosuvastatin (CRESTOR) 40 MG tablet   HYPERLIPIDEMIA, MIXED -- significant hypertriglyceridemia (Chronic)    Continues to have elevated triglycerides with normal total cholesterol level and borderline elevated LDL.  Need to watch dietary intake, low threshold to consider Lovaza for High TG levels -- this may also bring LDL to below 70. -> He had been written prescription for Vascepa before, but no longer on it likely because of financial reasons.  This would make Lovaza difficult as well.  He also has diabetes, blood sugar control will also help with triglycerides.-On SGLT2 inhibitor      Relevant Medications   rosuvastatin (CRESTOR) 40 MG tablet   Essential hypertension (Chronic)    Unfortunately, I do not have active vital signs on him.  He is on low-dose lisinopril and low-dose Lopressor.  For now, unless he has episodes of hypertension, would simply continue current doses.  He did tolerate the increased dose of Lopressor 25 mg daily.  Relevant Medications   rosuvastatin (CRESTOR) 40 MG tablet     Other   Presence of drug coated stent in right coronary artery: Promus Premier DES 3.0 mm x 18 mm (3.5 mm) to mid RCA occlusion 12/03/12 (Chronic)    On maintenance dose Brilinta 60 mg twice daily.  Okay to hold 5 to 7 days preop for surgery/procedures.  Okay to convert back to aspirin alone if there is significant bleeding.   Would ould be okay to  switch to clopidogrel 75 mg daily if necessary for financial reasons..      Diabetes mellitus type 2 with neurological manifestations ([place]) (Chronic)    This is likely part of the reason why his triglycerides are elevated.  Is on metformin and Jardiance with diabetes management by PCP.      Relevant Medications   rosuvastatin (CRESTOR) 40 MG tablet    ====================================  History of Present Illness:    [name] [name] is a 60 y.o. male with PMH notable for inferior STEMI in January 2015 with DES PCI to the RCA who presents via audio/video conferencing for a telehealth visit today as an annual f/u..  I have not seen [name] [name] in person in quite some time.  I last saw him in August 2019 prior to his most recent cardiac cath which showed widely patent RCA stent with no other stenoses. I saw him via telemedicine in January 2021  [name] [name] was last seen in person on October 03, 2020 by Tarri Glenn, [place].  This was for preop evaluation for colonoscopy.  At that time he was doing well with no major cardiac symptoms.  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  No anginal symptoms or dyspnea symptoms with rest or exertion. => Was cleared for his colonoscopy with clearance to hold Brilinta for 5 days preop. => He was on Vascepa at that time 2 g twice daily./Closely followed by Dr. Debara Pickett.  Hospitalizations:  N/a  Recent - Interim CV studies:    The following studies were reviewed today: N/a:  Inerval History   [name] [name] continues to be doing well.  Quite stable.  No active symptoms.  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  Always on the go.  Never sedentary.  Denies any chest pain or pressure with rest or exertion.  No PND orthopnea.  Does occasional dizzy episodes here and there but nothing prolonged.  Nothing associate with irregular heartbeats palpitations.  Cardiovascular ROS: no chest pain or dyspnea on  exertion negative for - chest pain, dyspnea on exertion, edema, irregular heartbeat, loss of consciousness, orthopnea, palpitations, paroxysmal nocturnal dyspnea, rapid heart rate, shortness of breath, or no TIA, amaurosis fugax.   ROS:  Please see the history of present illness for details..     Review of Systems  Constitutional:  Negative for malaise/fatigue and weight loss.  HENT:  Negative for congestion and nosebleeds.   Respiratory:  Negative for cough and shortness of breath.   Cardiovascular: Negative.   Gastrointestinal:  Negative for blood in stool and melena.  Genitourinary:  Negative for hematuria.  Musculoskeletal:  Negative for joint pain.       Occasional calf cramps  Neurological:  Negative for dizziness and focal weakness.  Psychiatric/Behavioral:  Negative for depression and memory loss. The patient is not nervous/anxious and does not have insomnia.    Past Medical History:  Diagnosis Date   Allergic  rhinitis    CAD S/P percutaneous coronary angioplasty 12/03/2013   PCI-mRCA (100%-0%) => Xience Alpine DES 3.0 mm x 18 mm --> 3.5 mm; Echo 1/'15" EF 45-50%, mild C LVH, mild Inf HK, Gr 1 DD   Diabetes mellitus    type2   Fracture of right hand 1983   Hyperlipidemia    Hypertension    p t denies   Nocturia    Paresthesia    tingling in feet   STEMI (ST elevation myocardial infarction) of inf. wall 12/03/2013   Past Surgical History:  Procedure Laterality Date   KNEE SURGERY     B arthroscopic   LEFT HEART CATH AND CORONARY ANGIOGRAPHY N/A 07/16/2018   Procedure: LEFT HEART CATH AND CORONARY ANGIOGRAPHY;  Surgeon: Belva Crome, MD;  Location: [place] CV LAB;  Widely patent stent in dominant RCA.  Tortuous/widely patent wraparound LAD.  Normal/tortuous LCx.  EF 55%.  Inferior HK.   LEFT HEART CATHETERIZATION WITH CORONARY ANGIOGRAM N/A 12/03/2013   Procedure: LEFT HEART CATHETERIZATION WITH CORONARY ANGIOGRAM;  Surgeon: Leonie Man, MD;  Location: [facility] C[facility]  LAB;; Large- Dom RCA - m100% (DES PCI) -<RPCA & RPAV-PLx3; Large-Long LM-< LAD, RI x 2 & LCx - mild Prox LAD, otw normal LCA System.   NM MYOVIEW LTD  11/2015; 06/2018   a) Normal; b)  EF 51%.  Findings consistent with prior MI with peri-infarct ischemia (LOW RISK).  Small defect of mild severity in the basal inferior mid inferior location.  There is also horizontal ST segment depression in inferior leads.  Although low risk images, the EKG changes are concerning.  There is concern for possible multivessel CAD.   PERCUTANEOUS CORONARY STENT INTERVENTION (PCI-S)  12/03/2013   mRCA - Xience Alpine DES 3.0 mm x 18 mm --> 3.5 mm   SEPTOPLASTY  1970   due to Fx Nose   TRANSTHORACIC ECHOCARDIOGRAM  12/03/2013; 11/2014   a. Mild Conc LVH; EF 45-50%, Mild Inferior HK. Grade 1 DD;; b. EF 55-60%. No RWMA. Moderate RV dilation but normal function.    Cardiac Cath-PCI December 03, 2013: Large caliber, long Left Main=<4 branches including LAD Dual Ramus Intermedius Branches, & LCx -<.  Large caliber tortuous LAD with small diagonal branches and several septal perforators.  Minimal irregularities.  Large caliber LCx coursing his lateral OM with diminutive to branch.  2 ramus branches (1 courses as D1, the other is OM1.)  Large caliber dominant RCA-100% mid RCA thrombotic occlusion (reduced to 70% with wiring) -<moderate to large caliber RPDA with moderate to large caliber RPAV & 3 RPL branches.  DES PCI mRCA Xience Alpine 3 x 18 --> 3.5 mm.  Cardiac Cath-July 16, 2018: Widely patent stent in dominant RCA.  Tortuous/widely patent wraparound LAD.  Normal/tortuous LCx.  EF 55%.  Inferior HK    Current Meds  Medication Sig   empagliflozin (JARDIANCE) 10 MG TABS tablet Take 1 tablet (10 mg total) by mouth daily before breakfast.   fenofibrate 160 MG tablet TAKE 1 TABLET BY MOUTH  DAILY   folic acid (FOLVITE) 630 MCG tablet Take 1 tablet (400 mcg total) by mouth daily.   LANTUS SOLOSTAR 100 UNIT/ML Solostar Pen INJECT  UP TO 60 UNITS SUBCUTANOUSLY ONCE DAILY   lisinopril (ZESTRIL) 2.5 MG tablet TAKE 1 TABLET BY MOUTH  DAILY   metFORMIN (GLUCOPHAGE) 500 MG tablet TAKE 1 TABLET BY MOUTH  TWICE DAILY WITH MEALS   metoprolol tartrate (LOPRESSOR) 25 MG tablet TAKE 1 TABLET  BY MOUTH  TWICE DAILY   nitroGLYCERIN (NITROSTAT) 0.4 MG SL tablet Place 1 tablet (0.4 mg total) under the tongue every 5 (five) minutes as needed for chest pain.   Omega-3 Fatty Acids (FISH OIL) 1000 MG CAPS Take 2 capsules (2,000 mg total) by mouth in the morning and at bedtime.   ONETOUCH DELICA LANCETS 50Y MISC Use as instructed to check blood sugar once daily or as needed.  Diagnosis:  E11.49  Non insulin dependent.   ONETOUCH VERIO test strip TEST ONCE DAILY AND AS NEEDED   vitamin B-12 (CYANOCOBALAMIN) 1000 MCG tablet Take 1 tablet (1,000 mcg total) by mouth daily.   vitamin B-6 (PYRIDOXINE) 25 MG tablet Take 1 tablet (25 mg total) by mouth daily.   [DISCONTINUED] BRILINTA 60 MG TABS tablet TAKE 1 TABLET BY MOUTH  TWICE DAILY  NEED OFFICE VISIT FOR  FUTURE REFILL   [DISCONTINUED] rosuvastatin (CRESTOR) 40 MG tablet TAKE 1 TABLET BY MOUTH  DAILY     Allergies:   Metformin and related   Social History   Tobacco Use   Smoking status: Former    Packs/day: 0.50    Years: 20.00    Pack years: 10.00    Types: Cigarettes    Quit date: 12/03/2013    Years since quitting: 7.8   Smokeless tobacco: Never  Vaping Use   Vaping Use: Never used  Substance Use Topics   Alcohol use: Yes    Comment: occ on weekends   Drug use: No     Family Hx: The patient's family history includes Cancer in his father and mother; Diabetes in his father; Hearing loss in his brother; Heart disease in his paternal grandfather; Hyperlipidemia in his father; Hypertension in his father. There is no history of Prostate cancer, Esophageal cancer, Inflammatory bowel disease, Liver disease, Pancreatic cancer, Stomach cancer, or Colon cancer.   Labs/Other Tests and  Data Reviewed:    EKG:  No ECG reviewed.  Recent Labs: 03/27/2021: ALT 32; BUN 13; Creatinine, Ser 0.91; Hemoglobin 13.4; Platelets 259.0; Potassium 4.8; Sodium 137; TSH 1.49   Recent Lipid Panel Lab Results  Component Value Date   CHOL 134 03/27/2021   HDL 34.20 (L) 03/27/2021   LDLDIRECT 73.0 03/27/2021   TRIG 224.0 (H) 03/27/2021   CHOLHDL 4 03/27/2021     Wt Readings from Last 3 Encounters:  09/28/21 210 lb (95.3 kg)  07/26/21 218 lb (98.9 kg)  04/10/21 217 lb (98.4 kg)     Objective:    Vital Signs:  Ht 5\' 10"  (1.778 m)   Wt 210 lb (95.3 kg)   BMI 30.13 kg/m   VITAL SIGNS:  reviewed GEN:  no acute distress RESPIRATORY:  Non-labored NEURO:  alert and oriented x 3, no obvious focal deficit   ==========================================  COVID-19 Education: The signs and symptoms of COVID-19 were discussed with the patient and how to seek care for testing (follow up with PCP or arrange E-visit).   The importance of social distancing was discussed today.  Time:   Today, I have spent 15 minutes with the patient with telehealth technology discussing the above problems.   An additional 22minutes spent charting (reviewing prior notes, hospital records, studies, labs etc.) Total 66minutes   Medication Adjustments/Labs and Tests Ordered: Current medicines are reviewed at length with the patient today.  Concerns regarding medicines are outlined above.   Patient Instructions  /Medication Instructions:  No change  *If you need a refill on your cardiac medications before your  next appointment, please call your pharmacy*   Lab Work: Per PCP     Testing/Procedures: N/a   Follow-Up: At [facility], you and your health needs are our priority.  As part of our continuing mission to provide you with exceptional heart care, we have created designated Provider Care Teams.  These Care Teams include your primary Cardiologist (physician) and Advanced Practice Providers  (APPs -  Physician Assistants and Nurse Practitioners) who all work together to provide you with the care you need, when you need it.  We recommend signing up for the patient portal called "MyChart".  Sign up information is provided on this After Visit Summary.  MyChart is used to connect with patients for Virtual Visits (Telemedicine).  Patients are able to view lab/test results, encounter notes, upcoming appointments, etc.  Non-urgent messages can be sent to your provider as well.   To learn more about what you can do with MyChart, go to [email or web address].    Your next appointment:   7 month(s) May 2023  The format for your next appointment:   In Person  Provider:   Glenetta Hew, MD   Other Instructions N/a   Signed, Glenetta Hew, MD  09/29/2021 9:30 PM    [place]

## 2021-09-28 NOTE — Patient Instructions (Addendum)
/  Medication Instructions:  No change  *If you need a refill on your cardiac medications before your next appointment, please call your pharmacy*   Lab Work: Per PCP     Testing/Procedures: N/a   Follow-Up: At [facility], you and your health needs are our priority.  As part of our continuing mission to provide you with exceptional heart care, we have created designated Provider Care Teams.  These Care Teams include your primary Cardiologist (physician) and Advanced Practice Providers (APPs -  Physician Assistants and Nurse Practitioners) who all work together to provide you with the care you need, when you need it.  We recommend signing up for the patient portal called "MyChart".  Sign up information is provided on this After Visit Summary.  MyChart is used to connect with patients for Virtual Visits (Telemedicine).  Patients are able to view lab/test results, encounter notes, upcoming appointments, etc.  Non-urgent messages can be sent to your provider as well.   To learn more about what you can do with MyChart, go to [email or web address].    Your next appointment:   7 month(s) May 2023  The format for your next appointment:   In Person  Provider:   Glenetta Hew, MD   Other Instructions N/a

## 2021-09-29 ENCOUNTER — Encounter: Payer: Self-pay | Admitting: Cardiology

## 2021-09-29 NOTE — Assessment & Plan Note (Signed)
   On maintenance dose Brilinta 60 mg twice daily.  Okay to hold 5 to 7 days preop for surgery/procedures.   Okay to convert back to aspirin alone if there is significant bleeding.    Would ould be okay to switch to clopidogrel 75 mg daily if necessary for financial reasons.Marland Kitchen

## 2021-09-29 NOTE — Assessment & Plan Note (Signed)
This is likely part of the reason why his triglycerides are elevated.  Is on metformin and Jardiance with diabetes management by PCP.

## 2021-09-29 NOTE — Assessment & Plan Note (Signed)
6-1/2 years out from inferior STEMI.  No further angina, and EF on follow-up echocardiogram showed preserved function improved from baseline post MI echo.  No current symptoms of angina or dyspnea.  He is very active with work, but not doing routine exercise.  All stable regimen.

## 2021-09-29 NOTE — Assessment & Plan Note (Signed)
Unfortunately, I do not have active vital signs on him.  He is on low-dose lisinopril and low-dose Lopressor.  For now, unless he has episodes of hypertension, would simply continue current doses.  He did tolerate the increased dose of Lopressor 25 mg daily.

## 2021-09-29 NOTE — Assessment & Plan Note (Addendum)
Single Vessel CAD with PCI to RCA -> no recurrent anginal symptoms on stable medications.:  Lopressor and lisinopril along with rosuvastatin and fenofibrate.  Remains on maintenance dose Brilinta 60 mg twice daily.  No bleeding issues, will continue as is. -> Okay to hold Brilinta 5 days preop for surgeries or procedures. (7 days preop for more high risk procedures)

## 2021-09-29 NOTE — Assessment & Plan Note (Addendum)
Continues to have elevated triglycerides with normal total cholesterol level and borderline elevated LDL.  Need to watch dietary intake, low threshold to consider Lovaza for High TG levels -- this may also bring LDL to below 70. -> He had been written prescription for Vascepa before, but no longer on it likely because of financial reasons.  This would make Lovaza difficult as well.  He also has diabetes, blood sugar control will also help with triglycerides.-On SGLT2 inhibitor

## 2021-10-23 ENCOUNTER — Encounter: Payer: Self-pay | Admitting: Family Medicine

## 2021-11-01 ENCOUNTER — Ambulatory Visit (INDEPENDENT_AMBULATORY_CARE_PROVIDER_SITE_OTHER): Payer: 59 | Admitting: Family Medicine

## 2021-11-01 ENCOUNTER — Other Ambulatory Visit: Payer: Self-pay

## 2021-11-01 ENCOUNTER — Encounter: Payer: Self-pay | Admitting: Family Medicine

## 2021-11-01 ENCOUNTER — Ambulatory Visit: Payer: 59 | Admitting: Family Medicine

## 2021-11-01 VITALS — BP 120/64 | HR 86 | Temp 98.3°F | Ht 70.0 in | Wt 212.0 lb

## 2021-11-01 DIAGNOSIS — E1149 Type 2 diabetes mellitus with other diabetic neurological complication: Secondary | ICD-10-CM

## 2021-11-01 LAB — POCT GLYCOSYLATED HEMOGLOBIN (HGB A1C): Hemoglobin A1C: 7.2 % — AB (ref 4.0–5.6)

## 2021-11-01 NOTE — Progress Notes (Signed)
This visit occurred during the SARS-CoV-2 public health emergency.  Safety protocols were in place, including screening questions prior to the visit, additional usage of staff PPE, and extensive cleaning of exam room while observing appropriate contact time as indicated for disinfecting solutions.  Diabetes:  Using medications without difficulties: yes Hypoglycemic episodes: no Hyperglycemic episodes: no Feet problems: tingling at baseline.  Not worse.   Blood Sugars averaging: ~130 eye exam within last year:due, d/w pt.  A1c d/w pt at OV.  7.2.   Taking ~46 units of insulin per day.  Max up to ~50 units.   Intentional weight loss noted.    He had been on lower dose of fish oil by accident, but will inc back to 2000 mg BID.    Wife recently had knee surgery, is in PT now.    Meds, vitals, and allergies reviewed.  ROS: Per HPI unless specifically indicated in ROS section   GEN: nad, alert and oriented HEENT: ncat NECK: supple w/o LA CV: rrr. PULM: ctab, no inc wob ABD: soft, +bs EXT: no edema SKIN: no acute rash

## 2021-11-01 NOTE — Patient Instructions (Addendum)
Please call about an eye exam.   Take care.  Glad to see you. Plan on recheck in about 4 months.  Schedule a physical for April with labs ahead of time.   If you need to have the physical after 04/11/22, then change the visit to a routine office visit for the same day without labs ahead of time.

## 2021-11-02 ENCOUNTER — Other Ambulatory Visit: Payer: Self-pay | Admitting: Cardiology

## 2021-11-04 NOTE — Assessment & Plan Note (Signed)
A1c d/w pt at OV.  7.2.   Taking ~46 units of insulin per day.  Max up to ~50 units.   Intentional weight loss noted.    He had been on lower dose of fish oil by accident, but will inc back to 2000 mg BID.   Recheck in a few months.  See after visit summary. Would continue Jardiance Lantus metformin.  He agrees with plan.

## 2021-11-06 ENCOUNTER — Other Ambulatory Visit: Payer: 59

## 2021-11-06 ENCOUNTER — Ambulatory Visit: Payer: 59 | Admitting: Podiatry

## 2021-11-07 ENCOUNTER — Other Ambulatory Visit: Payer: Self-pay

## 2021-11-07 ENCOUNTER — Ambulatory Visit: Payer: 59 | Admitting: Podiatry

## 2021-11-07 ENCOUNTER — Ambulatory Visit: Payer: 59

## 2021-11-07 ENCOUNTER — Other Ambulatory Visit: Payer: 59

## 2021-11-07 ENCOUNTER — Ambulatory Visit (INDEPENDENT_AMBULATORY_CARE_PROVIDER_SITE_OTHER): Payer: 59 | Admitting: Podiatry

## 2021-11-07 DIAGNOSIS — E119 Type 2 diabetes mellitus without complications: Secondary | ICD-10-CM

## 2021-11-07 DIAGNOSIS — E1149 Type 2 diabetes mellitus with other diabetic neurological complication: Secondary | ICD-10-CM

## 2021-11-07 NOTE — Progress Notes (Signed)
Patient presenting today to be molded for new custom molded orthotics.  Patient states that he does not want to be evaluated today.  He simply needs to get new orthotics.  His last orthotics were in 2019.  They help with the stability of his feet.  He does have a past medical history of diabetes mellitus.  He says that his primary care doctor evaluates his feet every 3 months.  Patient has a following appointment today right now with our Pedorthist to be molded for new orthotics.   Edrick Kins, DPM [facility]  Dr. Edrick Kins, DPM    [address], [place] [address]                Office [phone]  Fax [phone]

## 2021-11-07 NOTE — Progress Notes (Signed)
Patient refused service at this time as we are unable to guarantee insurance coverage. Provided relevant LCODEs and ICD-10 codes for patient to contact his insurance and verify coverage. Patient will call back if he wishes to proceed.

## 2021-11-19 ENCOUNTER — Other Ambulatory Visit: Payer: Self-pay | Admitting: Family Medicine

## 2022-02-15 ENCOUNTER — Ambulatory Visit (INDEPENDENT_AMBULATORY_CARE_PROVIDER_SITE_OTHER)
Admission: RE | Admit: 2022-02-15 | Discharge: 2022-02-15 | Disposition: A | Discharge status: Home / Self Care | Payer: 59 | Source: Ambulatory Visit | Attending: Family Medicine | Admitting: Family Medicine

## 2022-02-15 ENCOUNTER — Other Ambulatory Visit: Payer: Self-pay

## 2022-02-15 ENCOUNTER — Ambulatory Visit (INDEPENDENT_AMBULATORY_CARE_PROVIDER_SITE_OTHER): Payer: 59 | Admitting: Family Medicine

## 2022-02-15 VITALS — BP 118/74 | HR 68 | Ht 70.0 in | Wt 209.2 lb

## 2022-02-15 DIAGNOSIS — W19XXXA Unspecified fall, initial encounter: Secondary | ICD-10-CM

## 2022-02-15 DIAGNOSIS — E1149 Type 2 diabetes mellitus with other diabetic neurological complication: Secondary | ICD-10-CM | POA: Diagnosis not present

## 2022-02-15 DIAGNOSIS — R0789 Other chest pain: Secondary | ICD-10-CM

## 2022-02-15 DIAGNOSIS — M25522 Pain in left elbow: Secondary | ICD-10-CM | POA: Diagnosis not present

## 2022-02-15 DIAGNOSIS — L03114 Cellulitis of left upper limb: Secondary | ICD-10-CM | POA: Insufficient documentation

## 2022-02-15 LAB — CBC WITH DIFFERENTIAL/PLATELET
Absolute Monocytes: 853 cells/uL (ref 200–950)
Basophils Absolute: 32 cells/uL (ref 0–200)
Basophils Relative: 0.4 %
Eosinophils Absolute: 142 cells/uL (ref 15–500)
Eosinophils Relative: 1.8 %
HCT: 41.9 % (ref 38.5–50.0)
Hemoglobin: 14.1 g/dL (ref 13.2–17.1)
Lymphs Abs: 1572 cells/uL (ref 850–3900)
MCH: 29.2 pg (ref 27.0–33.0)
MCHC: 33.7 g/dL (ref 32.0–36.0)
MCV: 86.7 fL (ref 80.0–100.0)
MPV: 11.2 fL (ref 7.5–12.5)
Monocytes Relative: 10.8 %
Neutro Abs: 5301 cells/uL (ref 1500–7800)
Neutrophils Relative %: 67.1 %
Platelets: 220 10*3/uL (ref 140–400)
RBC: 4.83 10*6/uL (ref 4.20–5.80)
RDW: 13 % (ref 11.0–15.0)
Total Lymphocyte: 19.9 %
WBC: 7.9 10*3/uL (ref 3.8–10.8)

## 2022-02-15 LAB — POCT GLYCOSYLATED HEMOGLOBIN (HGB A1C): Hemoglobin A1C: 7.4 % — AB (ref 4.0–5.6)

## 2022-02-15 MED ORDER — CEFTRIAXONE SODIUM 1 G IJ SOLR
1.0000 g | Freq: Once | INTRAMUSCULAR | Status: AC
  Administered 2022-02-15: 1 g via INTRAMUSCULAR

## 2022-02-15 MED ORDER — DOXYCYCLINE HYCLATE 100 MG PO TABS: 100.0000 mg | ORAL_TABLET | Freq: Two times a day (BID) | ORAL | 0 refills | Status: DC

## 2022-02-15 MED ORDER — TRAMADOL HCL 50 MG PO TABS: 50.0000 mg | ORAL_TABLET | Freq: Three times a day (TID) | ORAL | 0 refills | Status: AC | PRN

## 2022-02-15 NOTE — Progress Notes (Signed)
? ? Patient ID: [name] [name], male    DOB: March 27, 1961, 61 y.o.   MRN: [id number] ? ?This visit was conducted in person. ? ?BP 118/74   Pulse 68   Ht '5\' 10"'$  (1.778 m)   Wt 209 lb 3.2 oz (94.9 kg)   SpO2 95%   BMI 30.02 kg/m?   ? ?CC:  ?Chief Complaint  ?Patient presents with  ? Fall  ?  Pt fell on Monday slip in water , hit at [place] ,  injury left elbow and right side rib, swelling in elbow , pain right, no x-ray , not using heat or ice or taking  anything for pain.   ? ? ?Subjective:  ? ?HPI: ?[name] [name] is a 61 y.o. male  with diabetes presenting on 02/15/2022 for Fall (Pt fell on Monday slip in water , hit at [employer] ,  injury left elbow and right side rib, swelling in elbow , pain right, no x-ray , not using heat or ice or taking  anything for pain. ) ? ? PCP Dr. Damita Dunnings ? ? He reports that 5 days ago, slipped in water, lost balance and fell. Was on trip in [place] at conference. ? Landed  over a chair.. hit  nose on floor, hit ribs on chair, hit left elbow. ? No proceeding symptoms of dizziness or CP. ? ? Now  pain in right chest wall.. pain mainly with going from sitting to standing. ? No SOB but cannot take deep breath. ? ? He  had elbow bandaged by  EMT, did not change for 24-48 hours. ? Now  applying topical antibiotic ointment. ? Increasing in redness and swelling. ? No fever. No flu like symptoms. Has been getting a sore throat lately. ? He seems more tired lately. ?Left elbow getting more red and swollen, now very tender to move it. ?  ?FBS increased  some lately 146 ?  ? Tired tramadol for pain last night...helped some, allowed him to sleep. ?   ? ?Relevant past medical, surgical, family and social history reviewed and updated as indicated. Interim medical history since our last visit reviewed. ?Allergies and medications reviewed and updated. ?Outpatient Medications Prior to Visit  ?Medication Sig Dispense Refill  ? B-D UF [name] MINI PEN NEEDLES 31G X 5 MM MISC USE AS  DIRECTED WITH INSULIN PEN 100 each 3  ? empagliflozin (JARDIANCE) 10 MG TABS tablet Take 1 tablet (10 mg total) by mouth daily before breakfast. 90 tablet 3  ? fenofibrate 160 MG tablet TAKE 1 TABLET BY MOUTH  DAILY 90 tablet 3  ? folic acid (FOLVITE) 379 MCG tablet Take 1 tablet (400 mcg total) by mouth daily. 90 tablet 3  ? LANTUS SOLOSTAR 100 UNIT/ML Solostar Pen INJECT UP TO 60 UNITS SUBCUTANOUSLY ONCE DAILY 15 mL PRN  ? lisinopril (ZESTRIL) 2.5 MG tablet TAKE 1 TABLET BY MOUTH  DAILY 90 tablet 3  ? metFORMIN (GLUCOPHAGE) 500 MG tablet TAKE 1 TABLET BY MOUTH  TWICE DAILY WITH MEALS 180 tablet 3  ? metoprolol tartrate (LOPRESSOR) 25 MG tablet TAKE 1 TABLET BY MOUTH  TWICE DAILY 180 tablet 3  ? nitroGLYCERIN (NITROSTAT) 0.4 MG SL tablet Place 1 tablet (0.4 mg total) under the tongue every 5 (five) minutes as needed for chest pain. 25 tablet 12  ? Omega-3 Fatty Acids (FISH OIL) 1000 MG CAPS Take 2 capsules (2,000 mg total) by mouth in the morning and at bedtime.    ?  ONETOUCH DELICA LANCETS 52W MISC Use as instructed to check blood sugar once daily or as needed.  Diagnosis:  E11.49  Non insulin dependent. 100 each 3  ? ONETOUCH VERIO test strip TEST ONCE DAILY AND AS NEEDED 50 strip 11  ? rosuvastatin (CRESTOR) 40 MG tablet Take 1 tablet (40 mg total) by mouth daily. 90 tablet 3  ? ticagrelor (BRILINTA) 60 MG TABS tablet TAKE 1 TABLET BY MOUTH  TWICE DAILY 180 tablet 3  ? vitamin B-12 (CYANOCOBALAMIN) 1000 MCG tablet Take 1 tablet (1,000 mcg total) by mouth daily.    ? vitamin B-6 (PYRIDOXINE) 25 MG tablet Take 1 tablet (25 mg total) by mouth daily.    ? ?No facility-administered medications prior to visit.  ?  ? ?Per HPI unless specifically indicated in ROS section below ?Review of Systems  ?Constitutional:  Negative for fatigue and fever.  ?HENT:  Negative for ear pain.   ?Eyes:  Negative for pain.  ?Respiratory:  Negative for cough and shortness of breath.   ?Cardiovascular:  Negative for chest pain,  palpitations and leg swelling.  ?Gastrointestinal:  Negative for abdominal pain.  ?Genitourinary:  Negative for dysuria.  ?Musculoskeletal:  Positive for arthralgias and back pain.  ?Neurological:  Negative for syncope, light-headedness and headaches.  ?Psychiatric/Behavioral:  Negative for dysphoric mood.   ?Objective:  ?BP 118/74   Pulse 68   Ht '5\' 10"'$  (1.778 m)   Wt 209 lb 3.2 oz (94.9 kg)   SpO2 95%   BMI 30.02 kg/m?   ?Wt Readings from Last 3 Encounters:  ?02/15/22 209 lb 3.2 oz (94.9 kg)  ?11/01/21 212 lb (96.2 kg)  ?09/28/21 210 lb (95.3 kg)  ?  ?  ?Physical Exam ?Constitutional:   ?   Appearance: He is well-developed.  ?HENT:  ?   Head: Normocephalic.  ?   Right Ear: Hearing normal.  ?   Left Ear: Hearing normal.  ?   Nose: Nose normal.  ?Neck:  ?   Thyroid: No thyroid mass or thyromegaly.  ?   Vascular: No carotid bruit.  ?   Trachea: Trachea normal.  ?Cardiovascular:  ?   Rate and Rhythm: Normal rate and regular rhythm.  ?   Pulses: Normal pulses.  ?   Heart sounds: Heart sounds not distant. No murmur heard. ?  No friction rub. No gallop.  ?   Comments: No peripheral edema ?Pulmonary:  ?   Effort: Pulmonary effort is normal. No respiratory distress.  ?   Breath sounds: Normal breath sounds.  ?Musculoskeletal:  ?   Right elbow: Normal range of motion.  ?   Left elbow: Swelling and effusion present. Decreased range of motion. Tenderness present.  ?   Cervical back: Normal.  ?   Thoracic back: Normal.  ?   Lumbar back: Normal.  ?   Comments: Erythema surrounding contusion on the left elbow  ?Skin: ?   General: Skin is warm and dry.  ?   Findings: No rash.  ?   Comments: Contusion on left elbow and left chest wall.  ?Psychiatric:     ?   Speech: Speech normal.     ?   Behavior: Behavior normal.     ?   Thought Content: Thought content normal.  ? ?   ?Results for orders placed or performed in visit on 02/15/22  ?POCT glycosylated hemoglobin (Hb A1C)  ?Result Value Ref Range  ? Hemoglobin A1C 7.4 (A) 4.0 -  5.6 %  ?  HbA1c POC (<> result, manual entry)    ? HbA1c, POC (prediabetic range)    ? HbA1c, POC (controlled diabetic range)    ? ? ?This visit occurred during the SARS-CoV-2 public health emergency.  Safety protocols were in place, including screening questions prior to the visit, additional usage of staff PPE, and extensive cleaning of exam room while observing appropriate contact time as indicated for disinfecting solutions.  ? ?COVID 19 screen:  No recent travel or known exposure to [place] ?The patient denies respiratory symptoms of COVID 19 at this time. ?The importance of social distancing was discussed today.  ? ?Assessment and Plan ?Problem List Items Addressed This Visit   ? ? Diabetes mellitus type 2 with neurological manifestations ([place]) - Primary (Chronic)  ?  Chronic, stable control ? ?Follow blood sugar closely. ? ?  ?  ? Relevant Orders  ? POCT glycosylated hemoglobin (Hb A1C) (Completed)  ? RESOLVED: Accidental fall  ? Relevant Orders  ? DG Ribs Unilateral Right (Completed)  ? DG Elbow Complete Left (Completed)  ? RESOLVED: Cellulitis of left arm  ? Relevant Orders  ? CBC with Differential/Platelet (Completed)  ? Elbow pain, left  ?  Acute, likely multifactorial. ? ? ? ?  ?  ? Relevant Orders  ? DG Elbow Complete Left (Completed)  ? Right-sided chest wall pain  ? Relevant Orders  ? DG Ribs Unilateral Right (Completed)  ? ?Start doxycycline for presumed cellulitis in left arm/ elbow. ? Ice on left elbow. ?We will call with CBC results. ? Go to ER if fever on antibiotics, redness or pain worsening. ? ? Use tramadol for pain as needed. ? Make sure to take deep breaths. ?  ? ?Eliezer Lofts, MD  ? ?

## 2022-02-15 NOTE — Patient Instructions (Addendum)
Use tramadol for pain as needed. ? Make sure to take deep breaths. ? Start doxycycline for presumed cellulitis in left arm/ elbow. ? Ice on left elbow. ?We will call with CBC results. ? Go to ER if fever on antibiotics, redness or pain worsening. ?  ?

## 2022-02-24 ENCOUNTER — Other Ambulatory Visit: Payer: Self-pay | Admitting: Family Medicine

## 2022-02-24 DIAGNOSIS — E1149 Type 2 diabetes mellitus with other diabetic neurological complication: Secondary | ICD-10-CM

## 2022-02-26 ENCOUNTER — Other Ambulatory Visit: Payer: Self-pay

## 2022-02-26 ENCOUNTER — Ambulatory Visit (INDEPENDENT_AMBULATORY_CARE_PROVIDER_SITE_OTHER): Payer: 59 | Admitting: Family Medicine

## 2022-02-26 ENCOUNTER — Encounter: Payer: Self-pay | Admitting: Family Medicine

## 2022-02-26 VITALS — BP 122/82 | HR 75 | Temp 98.0°F | Ht 70.0 in | Wt 208.0 lb

## 2022-02-26 DIAGNOSIS — M7020 Olecranon bursitis, unspecified elbow: Secondary | ICD-10-CM

## 2022-02-26 MED ORDER — DOXYCYCLINE HYCLATE 100 MG PO TABS: 100.0000 mg | ORAL_TABLET | Freq: Two times a day (BID) | ORAL | 0 refills | Status: DC

## 2022-02-26 NOTE — Progress Notes (Signed)
Taking 34 units insulin.  Done with doxycyline as of today.  L olecranon area still draining.  Prev had redness and swelling on the L forearm. Olecranon still ttp but getting better.  L elbow with normal ROM but improving discomfort on full extension.   ? ?He had prev slipped hit his L elbow, nose, R ribs.  No loss of consciousness.  In the meantime he had progressive left elbow pain and swelling and was diagnosed with cellulitis.  Previous office evaluation discussed with patient.  Has been treated with antibiotics in the meantime.  Left olecranon area is still draining.  Redness on the forearm is clearly improved.  No fevers.  Pain at the olecranon is improving.  Is still tender but decreased from prior. ? ? ?Meds, vitals, and allergies reviewed.  ?ROS: Per HPI unless specifically indicated in ROS section  ? ?Nad ?Ncat ?Left elbow with normal range of motion but he has swelling and erythema on the olecranon.  Distally neurovascular intact.  No sign of compartment syndrome in the left forearm.  No erythema on the forearm.  Olecranon area redressed with nonstick bandage. ?

## 2022-02-26 NOTE — Patient Instructions (Signed)
Keep it covered in the meantime.  Restart doxy and we'll call about seeing ortho.  If fever, more pain, more redness then go to the ER.   ?Take care.  Glad to see you. ? ?

## 2022-02-27 DIAGNOSIS — M7020 Olecranon bursitis, unspecified elbow: Secondary | ICD-10-CM | POA: Insufficient documentation

## 2022-02-27 NOTE — Assessment & Plan Note (Signed)
The concern is that he has persistent local irritation at the olecranon with purulent drainage.  He does not have systemic symptoms and he does not have spreading erythema.  Still okay for outpatient follow-up.  Discussed olecranon bursitis and the concern for infection ?I advised him to keep it covered in the meantime.  Restart doxy and we'll call about seeing ortho.  If fever, more pain, more redness then go to the ER.  He agrees to plan. ?

## 2022-03-04 ENCOUNTER — Encounter: Payer: Self-pay | Admitting: Family Medicine

## 2022-03-05 ENCOUNTER — Other Ambulatory Visit (INDEPENDENT_AMBULATORY_CARE_PROVIDER_SITE_OTHER): Payer: 59

## 2022-03-05 DIAGNOSIS — E1149 Type 2 diabetes mellitus with other diabetic neurological complication: Secondary | ICD-10-CM | POA: Diagnosis not present

## 2022-03-05 LAB — COMPREHENSIVE METABOLIC PANEL
ALT: 34 U/L (ref 0–53)
AST: 29 U/L (ref 0–37)
Albumin: 4.7 g/dL (ref 3.5–5.2)
Alkaline Phosphatase: 54 U/L (ref 39–117)
BUN: 15 mg/dL (ref 6–23)
CO2: 25 mEq/L (ref 19–32)
Calcium: 9.7 mg/dL (ref 8.4–10.5)
Chloride: 102 mEq/L (ref 96–112)
Creatinine, Ser: 0.84 mg/dL (ref 0.40–1.50)
GFR: 93.58 mL/min (ref >60.00)
Glucose, Bld: 136 mg/dL — ABNORMAL HIGH (ref 70–99)
Potassium: 4.5 mEq/L (ref 3.5–5.1)
Sodium: 138 mEq/L (ref 135–145)
Total Bilirubin: 0.5 mg/dL (ref 0.2–1.2)
Total Protein: 7 g/dL (ref 6.0–8.3)

## 2022-03-05 LAB — CBC WITH DIFFERENTIAL/PLATELET
Basophils Absolute: 0 10*3/uL (ref 0.0–0.1)
Basophils Relative: 0.5 % (ref 0.0–3.0)
Eosinophils Absolute: 0.1 10*3/uL (ref 0.0–0.7)
Eosinophils Relative: 2.7 % (ref 0.0–5.0)
HCT: 41.3 % (ref 39.0–52.0)
Hemoglobin: 14.2 g/dL (ref 13.0–17.0)
Lymphocytes Relative: 32 % (ref 12.0–46.0)
Lymphs Abs: 1.8 10*3/uL (ref 0.7–4.0)
MCHC: 34.5 g/dL (ref 30.0–36.0)
MCV: 85.6 fl (ref 78.0–100.0)
Monocytes Absolute: 0.5 10*3/uL (ref 0.1–1.0)
Monocytes Relative: 8.6 % (ref 3.0–12.0)
Neutro Abs: 3.1 10*3/uL (ref 1.4–7.7)
Neutrophils Relative %: 56.2 % (ref 43.0–77.0)
Platelets: 236 10*3/uL (ref 150.0–400.0)
RBC: 4.82 Mil/uL (ref 4.22–5.81)
RDW: 13.6 % (ref 11.5–15.5)
WBC: 5.6 10*3/uL (ref 4.0–10.5)

## 2022-03-05 LAB — LIPID PANEL
Cholesterol: 172 mg/dL (ref 0–200)
HDL: 37.7 mg/dL — ABNORMAL LOW (ref >39.00)
NonHDL: 133.89
Total CHOL/HDL Ratio: 5
Triglycerides: 265 mg/dL — ABNORMAL HIGH (ref 0.0–149.0)
VLDL: 53 mg/dL — ABNORMAL HIGH (ref 0.0–40.0)

## 2022-03-05 LAB — HEMOGLOBIN A1C: Hgb A1c MFr Bld: 8 % — ABNORMAL HIGH (ref 4.6–6.5)

## 2022-03-05 LAB — LDL CHOLESTEROL, DIRECT: Direct LDL: 83 mg/dL

## 2022-03-05 LAB — TSH: TSH: 1.2 u[IU]/mL (ref 0.35–5.50)

## 2022-03-05 LAB — VITAMIN B12: Vitamin B-12: 742 pg/mL (ref 211–911)

## 2022-03-12 ENCOUNTER — Encounter: Payer: Self-pay | Admitting: Family Medicine

## 2022-03-12 ENCOUNTER — Ambulatory Visit (INDEPENDENT_AMBULATORY_CARE_PROVIDER_SITE_OTHER): Payer: 59 | Admitting: Family Medicine

## 2022-03-12 VITALS — BP 122/62 | HR 85 | Temp 97.6°F | Ht 70.0 in | Wt 205.0 lb

## 2022-03-12 DIAGNOSIS — E782 Mixed hyperlipidemia: Secondary | ICD-10-CM

## 2022-03-12 DIAGNOSIS — M25522 Pain in left elbow: Secondary | ICD-10-CM

## 2022-03-12 DIAGNOSIS — Z7189 Other specified counseling: Secondary | ICD-10-CM

## 2022-03-12 DIAGNOSIS — Z0000 Encounter for general adult medical examination without abnormal findings: Secondary | ICD-10-CM | POA: Diagnosis not present

## 2022-03-12 DIAGNOSIS — E1149 Type 2 diabetes mellitus with other diabetic neurological complication: Secondary | ICD-10-CM

## 2022-03-12 MED ORDER — LANTUS SOLOSTAR 100 UNIT/ML ~~LOC~~ SOPN: 31.0000 [IU] | PEN_INJECTOR | Freq: Every day | SUBCUTANEOUS | 99 refills | Status: DC

## 2022-03-12 NOTE — Patient Instructions (Signed)
Recheck A1c in about 3 months.   ?Take care.  Glad to see you. ?I expect that your job change situation will help your sugar.   ?

## 2022-03-12 NOTE — Progress Notes (Signed)
CPE- See plan.  Routine anticipatory guidance given to patient.  See health maintenance.  The possibility exists that previously documented standard health maintenance information may have been brought forward from a previous encounter into this note.  If needed, that same information has been updated to reflect the current situation based on today's encounter.   ? ?covid vaccine done x2, d/w pt.   ?tdap 2015 ?PNA 2014 ?Flu prev done ?Shingles d/w pt.   ?Prostate cancer screening and PSA options (with potential risks and benefits of testing vs not testing) were discussed along with recent recs/guidelines.  He declined testing PSA at this point. ?Colonoscopy 2021. ?Diet and exercise d/w pt.   ?Living will d/w pt. Wife designated if patient were incapacitated.  ? ?Diabetes:  ?Using medications without difficulties: yes ?Hypoglycemic episodes: down to 7 recently, cautions d/w pt.   ?Hyperglycemic episodes:no ?Feet problems: at baseline ?Blood Sugars averaging: usually ~130 ?eye exam within last year: due, d/w pt.  He is scheduled.   ?A1c up, work stressors noted- d/w pt.  His dog recently died, condolences offered.  He is helping care for his father.   ? ?Elevated Cholesterol: ?Using medications without problems: yes ?Muscle aches: occ calf cramp but not exertional, tolerable.   ?Diet compliance: yes ?Exercise: yes ? ?L olecranon with chronic changes but clearly improved in the meantime.  He is still putting up with rib pain that is getting better.   ? ?PMH and SH reviewed ? ?Meds, vitals, and allergies reviewed.  ? ?ROS: Per HPI.  Unless specifically indicated otherwise in HPI, the patient denies: ? ?General: fever. ?Eyes: acute vision changes ?ENT: sore throat ?Cardiovascular: chest pain ?Respiratory: SOB ?GI: vomiting ?GU: dysuria ?Musculoskeletal: acute back pain ?Derm: acute rash ?Neuro: acute motor dysfunction ?Psych: worsening mood ?Endocrine: polydipsia ?Heme: bleeding ?Allergy: hayfever ? ?GEN: nad, alert and  oriented ?HEENT: ncat ?NECK: supple w/o LA ?CV: rrr. ?PULM: ctab, no inc wob ?ABD: soft, +bs ?EXT: no edema ?SKIN: no acute rash ?L elbow with resolving bursitis.  No spreading erythema.  Normal range of motion.  It clearly looks better than prior. ?

## 2022-03-13 NOTE — Assessment & Plan Note (Signed)
covid vaccine done x2, d/w pt.   ?tdap 2015 ?PNA 2014 ?Flu prev done ?Shingles d/w pt.   ?Prostate cancer screening and PSA options (with potential risks and benefits of testing vs not testing) were discussed along with recent recs/guidelines.  He declined testing PSA at this point. ?Colonoscopy 2021. ?Diet and exercise d/w pt.   ?Living will d/w pt. Wife designated if patient were incapacitated.  ?

## 2022-03-13 NOTE — Assessment & Plan Note (Signed)
Living will d/w pt.  Wife designated if patient were incapacitated.   ?

## 2022-03-13 NOTE — Assessment & Plan Note (Signed)
Would continue Jardiance and insulin and metformin as is for now.  Given his recent job changes and stressors, that would likely explain his sugar elevation.  He is hoping that his situation will settle down and we agreed that it was reasonable to recheck his A1c in about 3 months with expectation of likely improvement in the meantime.  He agrees with plan. ?

## 2022-03-13 NOTE — Assessment & Plan Note (Signed)
He had orthopedic eval in the meantime.  He is clearly better.  He will update me as needed. ?

## 2022-03-13 NOTE — Assessment & Plan Note (Signed)
Continue Crestor, continue work on diet and exercise.  He agrees with plan. ?

## 2022-03-31 NOTE — Assessment & Plan Note (Signed)
Chronic, stable control ? ?Follow blood sugar closely. ?

## 2022-03-31 NOTE — Assessment & Plan Note (Signed)
Acute, likely multifactorial. ? ? ?

## 2022-04-30 ENCOUNTER — Other Ambulatory Visit: Payer: Self-pay | Admitting: Family Medicine

## 2022-05-07 ENCOUNTER — Encounter (HOSPITAL_COMMUNITY): Payer: Self-pay

## 2022-05-07 ENCOUNTER — Emergency Department (HOSPITAL_COMMUNITY)
Admission: EM | Admit: 2022-05-07 | Discharge: 2022-05-07 | Disposition: A | Discharge status: Home / Self Care | Payer: 59 | Attending: Emergency Medicine | Admitting: Emergency Medicine

## 2022-05-07 ENCOUNTER — Telehealth: Payer: Self-pay

## 2022-05-07 ENCOUNTER — Other Ambulatory Visit: Payer: Self-pay

## 2022-05-07 DIAGNOSIS — Z4800 Encounter for change or removal of nonsurgical wound dressing: Secondary | ICD-10-CM | POA: Insufficient documentation

## 2022-05-07 DIAGNOSIS — E119 Type 2 diabetes mellitus without complications: Secondary | ICD-10-CM | POA: Insufficient documentation

## 2022-05-07 DIAGNOSIS — W460XXA Contact with hypodermic needle, initial encounter: Secondary | ICD-10-CM

## 2022-05-07 DIAGNOSIS — W461XXA Contact with contaminated hypodermic needle, initial encounter: Secondary | ICD-10-CM | POA: Diagnosis not present

## 2022-05-07 DIAGNOSIS — Z7984 Long term (current) use of oral hypoglycemic drugs: Secondary | ICD-10-CM | POA: Insufficient documentation

## 2022-05-07 DIAGNOSIS — Z79899 Other long term (current) drug therapy: Secondary | ICD-10-CM | POA: Insufficient documentation

## 2022-05-07 DIAGNOSIS — Z794 Long term (current) use of insulin: Secondary | ICD-10-CM | POA: Insufficient documentation

## 2022-05-07 DIAGNOSIS — Z5189 Encounter for other specified aftercare: Secondary | ICD-10-CM

## 2022-05-07 NOTE — Telephone Encounter (Signed)
[facility] - Client TELEPHONE ADVICE RECORD [facility] Patient Name: [name] [name] Gender: Male DOB: Feb 27, 1961 Age: 61 Y 97 M 30 D Return Phone Number: [phone] (Primary) Address: City/ State/ Zip: [place] [address] Client [facility] - Client Client Site [facility] - Day Provider Renford Dills - MD Contact Type Call Who Is Calling Patient / Member / Family / Caregiver Call Type Triage / Clinical Relationship To Patient Self Return Phone Number [phone] (Primary) Chief Complaint POISON - swallowed foreign body, pills or other dangerous substance Reason for Call Symptomatic / Request for [place] states, broke a diabetic needle in stomach. Not able to see it. Red around injection site. Cannot see it. Translation No Nurse Assessment Nurse: Kirk Ruths, RN, [place] [facility] Date/Time (Eastern Time): 05/07/2022 8:23:52 AM Confirm and document reason for call. If symptomatic, describe symptoms. ---Caller states he was injecting his insulin last night with pen and broke off half of needle there is a red area where the needle should be he cant see the needle no bleediing last night Does the patient have any new or worsening symptoms? ---Yes Will a triage be completed? ---Yes Related visit to physician within the last 2 weeks? ---No Does the PT have any chronic conditions? (i.e. diabetes, asthma, this includes High risk factors for pregnancy, etc.) ---Yes List chronic conditions. ---dm Is this a behavioral health or substance abuse call? ---No Guidelines Guideline Title Affirmed Question Affirmed Notes Nurse Date/Time (Eastern Time) Needlestick Sensation of something still in the wound (i.e., needle broke off) Kirk Ruths, Therapist, sports, [facility] 05/07/2022 8:25:45 AM Disp. Time Eilene Ghazi Time) Disposition Final User 05/07/2022 8:22:14 AM Send to Urgent Georjean Mode, [place] PLEASE NOTE:  All timestamps contained within this report are represented as [place] Standard Time. CONFIDENTIALTY NOTICE: This fax transmission is intended only for the addressee. It contains information that is legally privileged, confidential or otherwise protected from use or disclosure. If you are not the intended recipient, you are strictly prohibited from reviewing, disclosing, copying using or disseminating any of this information or taking any action in reliance on or regarding this information. If you have received this fax in error, please notify us immediately by telephone so that we can arrange for its return to [place]. Phone: [phone], Toll-Free: [phone], Fax: [phone] Page: 2 of 2 Call Id: [id number] 05/07/2022 8:35:59 AM Go to ED Now Yes Kirk Ruths, RN, Liana Gerold Disagree/Comply Disagree Caller Understands Yes PreDisposition Call Doctor Care Advice Given Per Guideline GO TO ED NOW: * You need to be seen in the Emergency Department. * Go to the ED at ___________ [place] now. Drive carefully. FIRST AID - [place] THE WOUND: * Wash wound and skin with soap and water. CARE ADVICE given per [place] (Adult) guideline. Comments User: Tawni Levy, RN Date/Time (Eastern Time): 05/07/2022 8:35:45 AM Called office per partient request to see if this is something they can handle at office and was told per Liane Comber who answered the phone that patient would need to go to ER for eval and treatment. Patient made aware and states he probably is not going to do this bcause he does not want to spend all day at the Er. He is advised of the route of open infection and possible movement of needle etc. Referrals GO TO FACILITY REFUSE

## 2022-05-07 NOTE — ED Provider Notes (Signed)
[facility] EMERGENCY DEPARTMENT Provider Note   CSN: [id number] Arrival date & time: 05/07/22  2707     History  No chief complaint on file.   [name] [name] is a 61 y.o. male with a past medical history of diabetes who presents to the emergency department complaining of needle breaking off into his abdomen onset last night.  Patient notes he was given himself his insulin and believes that the needle broke off into his abdomen.  His primary care provider told him to come to the ED for further evaluation.  No meds tried prior to arrival.  Denies fever, chills, nausea, vomiting, pain.  The history is provided by the patient. No language interpreter was used.      Home Medications Prior to Admission medications   Medication Sig Start Date End Date Taking? Authorizing Provider  B-D UF [name] MINI PEN NEEDLES 31G X 5 MM MISC USE AS DIRECTED WITH INSULIN PEN 05/08/21   Tonia Ghent, MD  empagliflozin (JARDIANCE) 10 MG TABS tablet Take 1 tablet (10 mg total) by mouth daily before breakfast. 08/21/21   Tonia Ghent, MD  fenofibrate 160 MG tablet TAKE 1 TABLET BY MOUTH  DAILY 11/05/21   Leonie Man, MD  folic acid (FOLVITE) 867 MCG tablet Take 1 tablet (400 mcg total) by mouth daily. 04/10/21   Tonia Ghent, MD  insulin glargine (LANTUS SOLOSTAR) 100 UNIT/ML Solostar Pen Inject 31 Units into the skin daily. 03/12/22   Tonia Ghent, MD  lisinopril (ZESTRIL) 2.5 MG tablet TAKE 1 TABLET BY MOUTH  DAILY 11/05/21   Leonie Man, MD  metFORMIN (GLUCOPHAGE) 500 MG tablet TAKE 1 TABLET BY MOUTH  TWICE DAILY WITH MEALS 08/03/21   Tonia Ghent, MD  metoprolol tartrate (LOPRESSOR) 25 MG tablet TAKE 1 TABLET BY MOUTH  TWICE DAILY 09/06/21   Leonie Man, MD  nitroGLYCERIN (NITROSTAT) 0.4 MG SL tablet Place 1 tablet (0.4 mg total) under the tongue every 5 (five) minutes as needed for chest pain. 06/21/21   Leonie Man, MD  Omega-3 Fatty Acids (FISH OIL) 1000  MG CAPS Take 2 capsules (2,000 mg total) by mouth in the morning and at bedtime. 12/13/20   Tonia Ghent, MD  [facility] DELICA LANCETS 54G MISC Use as instructed to check blood sugar once daily or as needed.  Diagnosis:  E11.49  Non insulin dependent. 06/19/17   Tonia Ghent, MD  [facility] VERIO test strip TEST ONCE DAILY AND AS NEEDED 04/30/22   Tonia Ghent, MD  rosuvastatin (CRESTOR) 40 MG tablet Take 1 tablet (40 mg total) by mouth daily. 09/28/21   Leonie Man, MD  ticagrelor [facility] 60 MG TABS tablet TAKE 1 TABLET BY MOUTH  TWICE DAILY 09/28/21   Leonie Man, MD  vitamin B-12 (CYANOCOBALAMIN) 1000 MCG tablet Take 1 tablet (1,000 mcg total) by mouth daily. 09/28/14   Tonia Ghent, MD  vitamin B-6 (PYRIDOXINE) 25 MG tablet Take 1 tablet (25 mg total) by mouth daily. 09/28/14   Tonia Ghent, MD      Allergies    Metformin and related    Review of Systems   Review of Systems  Constitutional:  Negative for fever.  Gastrointestinal:  Negative for abdominal pain, nausea and vomiting.  Skin:  Positive for color change. Negative for rash and wound.  All other systems reviewed and are negative.  Physical Exam Updated Vital Signs BP 134/89 (BP  Location: Right Arm)   Pulse 66   Temp 99 F (37.2 C) (Oral)   Resp 14   SpO2 97%  Physical Exam Vitals and nursing note reviewed.  Constitutional:      General: He is not in acute distress.    Appearance: He is not diaphoretic.  HENT:     Head: Normocephalic and atraumatic.     Mouth/Throat:     Pharynx: No oropharyngeal exudate.  Eyes:     General: No scleral icterus.    Conjunctiva/sclera: Conjunctivae normal.  Cardiovascular:     Rate and Rhythm: Normal rate and regular rhythm.     Pulses: Normal pulses.     Heart sounds: Normal heart sounds.  Pulmonary:     Effort: Pulmonary effort is normal. No respiratory distress.     Breath sounds: Normal breath sounds. No wheezing.  Abdominal:     General: Bowel  sounds are normal.     Palpations: Abdomen is soft. There is no mass.     Tenderness: There is no abdominal tenderness. There is no guarding or rebound.  Musculoskeletal:        General: Normal range of motion.     Cervical back: Normal range of motion and neck supple.  Skin:    General: Skin is warm and dry.     Comments: Small pinpoint area of redness to left lower quadrant without surrounding erythema.  No fluctuance or induration noted.  Neurological:     Mental Status: He is alert.  Psychiatric:        Behavior: Behavior normal.    ED Results / Procedures / Treatments   Labs (all labs ordered are listed, but only abnormal results are displayed) Labs Reviewed - No data to display  EKG None  Radiology No results found.  Procedures Ultrasound ED Soft Tissue  Date/Time: 05/07/2022 12:55 PM Performed by: Steva Colder A, PA-C Authorized by: Steva Colder A, PA-C   Procedure details:    Indications: evaluate for foreign body     Transverse view:  Not visualized   Longitudinal view:  Not visualized   Images: not archived     Limitations:  Positioning Location:    Location: abdominal wall     Side:  Left Findings:     no abscess present    no cellulitis present    no foreign body present    Medications Ordered in ED Medications - No data to display  ED Course/ Medical Decision Making/ A&P                           Medical Decision Making  Pt presents with concerns for needle being stuck in his abdomen onset last night. Has a history of DM and was injecting his insulin last night when the needle broke off. He is unaware of where the needle is. Vital signs, stable, pt afebrile. On exam, pt with small pinpoint area of erythema noted to left lower quadrant.  No tenderness to palpation noted to abdomen.  No surrounding erythema.  No fluctuance or induration noted.  No acute cardiovascular, respiratory, abdominal exam findings. Differential diagnosis includes cellulitis,  abscess, foreign body.    Disposition: Presentation suspicious for wound check due to a needlestick with insulin.  Bedside ultrasound completed by myself without indication for metallic foreign body on exam.  Doubt cellulitis or abscess at this time.  Doubt foreign body at this time.  After consideration of the diagnostic results  and the patients response to treatment, I feel that the patient would benefit from Discharge home. Supportive care measures and strict return precautions discussed with patient at bedside. Pt acknowledges and verbalizes understanding. Pt appears safe for discharge. Follow up as indicated in discharge paperwork.    This chart was dictated using voice recognition software, Dragon. Despite the best efforts of this provider to proofread and correct errors, errors may still occur which can change documentation meaning.    Final Clinical Impression(s) / ED Diagnoses Final diagnoses:  Visit for wound check  Needle stick, hypodermic, accidental, initial encounter    Rx / DC Orders ED Discharge Orders     None         Dahlila Pfahler A, PA-C 05/07/22 1255    Horton, Alvin Critchley, DO 05/07/22 1508

## 2022-05-07 NOTE — ED Triage Notes (Signed)
Patient here with requesting to make sure that he doesn't have needle stuck in abdomen after giving himself insulin last night.

## 2022-05-07 NOTE — Telephone Encounter (Signed)
Per chart review tab pt is at [facility] ED. Sending note to Dr Damita Dunnings.

## 2022-05-07 NOTE — Telephone Encounter (Signed)
Noted. Thanks.

## 2022-05-07 NOTE — Discharge Instructions (Signed)
It was a pleasure taking care of you today!  Continue taking medications as prescribed.  You may follow-up with your primary care provider as needed.  Return to the emergency department if you are experiencing increasing/worsening pain, fever, redness spreading, worsening symptoms.

## 2022-05-27 ENCOUNTER — Other Ambulatory Visit: Payer: Self-pay | Admitting: Family Medicine

## 2022-05-31 ENCOUNTER — Other Ambulatory Visit: Payer: Self-pay | Admitting: Family Medicine

## 2022-06-03 ENCOUNTER — Other Ambulatory Visit: Payer: Self-pay | Admitting: Family Medicine

## 2022-06-06 ENCOUNTER — Other Ambulatory Visit: Payer: Self-pay | Admitting: Family Medicine

## 2022-06-07 ENCOUNTER — Telehealth: Payer: Self-pay

## 2022-06-07 MED ORDER — EMPAGLIFLOZIN 10 MG PO TABS: 10.0000 mg | ORAL_TABLET | Freq: Every day | ORAL | 0 refills | Status: DC

## 2022-06-07 NOTE — Telephone Encounter (Signed)
MEDICATION: JARDIANCE 10 MG TABS tablet  PHARMACY: [place], [place] - [place]  Comments: Patient is completely out and Mail order didn't mail out medication until yesterday.   **Let patient know to contact pharmacy at the end of the day to make sure medication is ready. **  ** Please notify patient to allow 48-72 hours to process**  **Encourage patient to contact the pharmacy for refills or they can request refills through [facility]**

## 2022-06-07 NOTE — Telephone Encounter (Signed)
ERX sent.  

## 2022-06-07 NOTE — Telephone Encounter (Signed)
PA started in covermymeds for Jardiance 10 mg tablets; KEY: [id number]. Awaiting determination.

## 2022-06-07 NOTE — Addendum Note (Signed)
Addended by: Sherrilee Gilles B on: 06/07/2022 03:18 PM   Modules accepted: Orders

## 2022-06-10 NOTE — Telephone Encounter (Signed)
PA determination:   This medication or product is on your plan's list of covered drugs. Prior authorization is not required at this time. If your pharmacy has questions regarding the processing of your prescription, please have them call the [facility] pharmacy help desk at [phone]. **Please note: This request was submitted electronically. Formulary lowering, tiering exception, cost reduction and/or pre-benefit determination review (including prospective Medicare hospice reviews) requests cannot be requested using this method of submission. Providers contact us at [phone] for further assistance.

## 2022-06-11 ENCOUNTER — Encounter: Payer: Self-pay | Admitting: Family Medicine

## 2022-06-11 ENCOUNTER — Ambulatory Visit (INDEPENDENT_AMBULATORY_CARE_PROVIDER_SITE_OTHER): Payer: 59 | Admitting: Family Medicine

## 2022-06-11 VITALS — BP 104/60 | HR 69 | Temp 97.6°F | Ht 70.0 in | Wt 196.0 lb

## 2022-06-11 DIAGNOSIS — E1149 Type 2 diabetes mellitus with other diabetic neurological complication: Secondary | ICD-10-CM | POA: Diagnosis not present

## 2022-06-11 LAB — POCT GLYCOSYLATED HEMOGLOBIN (HGB A1C): Hemoglobin A1C: 6.9 % — AB (ref 4.0–5.6)

## 2022-06-11 MED ORDER — LANTUS SOLOSTAR 100 UNIT/ML ~~LOC~~ SOPN: 15.0000 [IU] | PEN_INJECTOR | Freq: Every day | SUBCUTANEOUS | Status: DC

## 2022-06-11 NOTE — Progress Notes (Unsigned)
Diabetes:  Using medications without difficulties: yes Hypoglycemic episodes:no Hyperglycemic episodes:no Feet problems: some burning but improved with shoe change.  Blood Sugars averaging: 100-140 on lower dose of insulin.   eye exam within last year: due, pending for this month.  A1c 8---->6.9.  Intentional weight loss noted.  D/w pt about diet and exercise.  No FCNAVD.    We talked about his father's situation with memory changes.    Meds, vitals, and allergies reviewed.  ROS: Per HPI unless specifically indicated in ROS section   GEN: nad, alert and oriented HEENT: ncat NECK: supple w/o LA CV: rrr. PULM: ctab, no inc wob ABD: soft, +bs EXT: no edema SKIN: no acute rash  Diabetic foot exam: Normal inspection except for healing scrape on the L 1st MT (recent had mechanical fall carrying a cooler) No skin breakdown Small calluses B1st toes Normal DP pulses Normal sensation to light touch and monofilament Nails normal

## 2022-06-11 NOTE — Patient Instructions (Addendum)
If lightheaded on standing then cut the metoprolol back to 12.'5mg'$  twice a day.   Taper insulin if needed.  Cut a unit is AM sugar is below 120.   Recheck in about 4 months, A1c at the visit.  Recheck sooner if needed.  Take care.  Glad to see you. Thanks for your effort.

## 2022-06-12 NOTE — Assessment & Plan Note (Signed)
A1c is clearly better.  Discussed with patient.  I thanked him for his effort If lightheaded on standing then cut the metoprolol back to 12.'5mg'$  twice a day.  Discussed that this may be an issue with weight loss. Taper insulin if needed.  Cut a unit is AM sugar is below 120.   Recheck in about 4 months, A1c at the visit.  Recheck sooner if needed.

## 2022-06-20 ENCOUNTER — Other Ambulatory Visit: Payer: Self-pay | Admitting: Family Medicine

## 2022-06-20 DIAGNOSIS — E1149 Type 2 diabetes mellitus with other diabetic neurological complication: Secondary | ICD-10-CM

## 2022-06-20 LAB — HM DIABETES EYE EXAM

## 2022-08-04 ENCOUNTER — Other Ambulatory Visit: Payer: Self-pay | Admitting: Cardiology

## 2022-08-13 ENCOUNTER — Other Ambulatory Visit: Payer: Self-pay | Admitting: Family Medicine

## 2022-08-13 ENCOUNTER — Other Ambulatory Visit: Payer: Self-pay | Admitting: Cardiology

## 2022-09-19 ENCOUNTER — Emergency Department (HOSPITAL_COMMUNITY): Payer: 59

## 2022-09-19 ENCOUNTER — Emergency Department (HOSPITAL_COMMUNITY)
Admission: EM | Admit: 2022-09-19 | Discharge: 2022-09-20 | Disposition: A | Discharge status: Home / Self Care | Payer: 59 | Attending: Emergency Medicine | Admitting: Emergency Medicine

## 2022-09-19 ENCOUNTER — Encounter (HOSPITAL_COMMUNITY): Payer: Self-pay | Admitting: Emergency Medicine

## 2022-09-19 DIAGNOSIS — W01198A Fall on same level from slipping, tripping and stumbling with subsequent striking against other object, initial encounter: Secondary | ICD-10-CM | POA: Insufficient documentation

## 2022-09-19 DIAGNOSIS — R0789 Other chest pain: Secondary | ICD-10-CM | POA: Diagnosis present

## 2022-09-19 DIAGNOSIS — W19XXXA Unspecified fall, initial encounter: Secondary | ICD-10-CM

## 2022-09-19 NOTE — ED Triage Notes (Signed)
Pt fell on utility trailer around 6 pm. Pt landed on chest and endorses pain across chest, under breast. Denies LOC or hitting head.

## 2022-09-19 NOTE — ED Provider Triage Note (Signed)
Emergency Medicine Provider Triage Evaluation Note  [name] [name] , a 61 y.o. male  was evaluated in triage.  Pt complains of pain to the left side of the chest after a fall.  Patient was moving a trailer when he slipped, falling forward and hitting his chest directly on the metal bar of the trailer.  Patient is on Bril[place] but denies hitting his head denies losing consciousness.  Patient states he used to have a "knot" in the center of his chest which he feels is missing now after the impact.  He complains of pain with laughing, deep inspiration.  Review of Systems  Positive: As above Negative: As above  Physical Exam  BP (!) 143/101 (BP Location: Left Arm)   Pulse 73   Temp 98.2 F (36.8 C) (Oral)   Resp (!) 22   Ht '5\' 10"'$  (1.778 m)   Wt 87.1 kg   SpO2 98%   BMI 27.55 kg/m  Gen:   Awake, no distress   Resp:  Normal effort  MSK:   Moves extremities without difficulty  Other:    Medical Decision Making  Medically screening exam initiated at 7:09 PM.  Appropriate orders placed.  [name] was informed that the remainder of the evaluation will be completed by another provider, this initial triage assessment does not replace that evaluation, and the importance of remaining in the ED until their evaluation is complete.     Ronny Bacon 09/19/22 1910

## 2022-09-20 MED ORDER — CYCLOBENZAPRINE HCL 10 MG PO TABS: 10.0000 mg | ORAL_TABLET | Freq: Two times a day (BID) | ORAL | 0 refills | Status: DC | PRN

## 2022-09-20 MED ORDER — CYCLOBENZAPRINE HCL 10 MG PO TABS
10.0000 mg | ORAL_TABLET | Freq: Once | ORAL | Status: AC
  Administered 2022-09-20: 10 mg via ORAL

## 2022-09-20 MED ORDER — ACETAMINOPHEN 500 MG PO TABS
1000.0000 mg | ORAL_TABLET | Freq: Once | ORAL | Status: AC
  Administered 2022-09-20: 1000 mg via ORAL

## 2022-09-20 NOTE — Discharge Instructions (Addendum)
Please return to the emergency department if you develop any new or worsening symptoms, difficulty breathing, shortness of breath, chest pain, difficulty walking or any other concerning symptoms.  Please take the muscle relaxer Flexeril as needed for discomfort, however do not drive on the medication as it can make you sleepy.

## 2022-09-20 NOTE — ED Notes (Signed)
Pt came to this NT and asked about wait times, this NT stated staff cannot give out wait times due to it being an EMTALA violation. Pt stated "test results came back", this NT stated only the doctor can read back test results. So all we are doing now is waiting to go back to talk to the doctor. Pt stated "guess I won't be staying long enough to talk to the doctor".

## 2022-09-20 NOTE — ED Provider Notes (Signed)
[facility] EMERGENCY DEPARTMENT Provider Note   CSN: [id number] Arrival date & time: 09/19/22  1854     History {Add pertinent medical, surgical, social history, OB history to HPI:1} Chief Complaint  Patient presents with   [name] [name] is a 61 y.o. male.   Fall       Home Medications Prior to Admission medications   Medication Sig Start Date End Date Taking? Authorizing Provider  B-D UF [name] MINI PEN NEEDLES 31G X 5 MM MISC USE AS DIRECTED WITH INSULIN PEN 06/20/22   Tonia Ghent, MD  BRILINTA 60 MG TABS tablet TAKE 1 TABLET BY MOUTH TWICE  DAILY 08/13/22   Leonie Man, MD  empagliflozin (JARDIANCE) 10 MG TABS tablet Take 1 tablet (10 mg total) by mouth daily before breakfast. 06/07/22   Tonia Ghent, MD  fenofibrate 160 MG tablet Take 1 tablet (160 mg total) by mouth daily. SCHEDULE OFFICE VISIT FOR FUTURE REFILLS. 08/13/22   Leonie Man, MD  folic acid (FOLVITE) 378 MCG tablet TAKE 1 TABLET BY MOUTH  DAILY 05/27/22   Tonia Ghent, MD  insulin glargine (LANTUS SOLOSTAR) 100 UNIT/ML Solostar Pen Inject 15 Units into the skin daily. 06/11/22   Tonia Ghent, MD  lisinopril (ZESTRIL) 2.5 MG tablet Take 1 tablet (2.5 mg total) by mouth daily. SCHEDULE OFFICE VISIT FOR FUTURE REFILLS. 08/13/22   Leonie Man, MD  metFORMIN (GLUCOPHAGE) 500 MG tablet TAKE 1 TABLET BY MOUTH  TWICE DAILY WITH MEALS 06/05/22   Tonia Ghent, MD  metoprolol tartrate (LOPRESSOR) 25 MG tablet TAKE 1 TABLET BY MOUTH  TWICE DAILY 08/05/22   Leonie Man, MD  nitroGLYCERIN (NITROSTAT) 0.4 MG SL tablet DISSOLVE 1 TABLET UNDER THE TONGUE EVERY 5 MINUTES AS  NEEDED FOR CHEST PAIN. MAX  OF 3 TABLETS IN 15 MINUTES. CALL 911 IF PAIN PERSISTS. 08/13/22   Leonie Man, MD  Omega-3 Fatty Acids (FISH OIL) 1000 MG CAPS Take 2 capsules (2,000 mg total) by mouth in the morning and at bedtime. 12/13/20   Tonia Ghent, MD  [facility] DELICA LANCETS 58I MISC  Use as instructed to check blood sugar once daily or as needed.  Diagnosis:  E11.49  Non insulin dependent. 06/19/17   Tonia Ghent, MD  [facility] VERIO test strip TEST ONCE DAILY AND AS NEEDED 04/30/22   Tonia Ghent, MD  rosuvastatin (CRESTOR) 40 MG tablet TAKE 1 TABLET BY MOUTH DAILY 08/05/22   Leonie Man, MD  vitamin B-12 (CYANOCOBALAMIN) 1000 MCG tablet Take 1 tablet (1,000 mcg total) by mouth daily. 09/28/14   Tonia Ghent, MD  vitamin B-6 (PYRIDOXINE) 25 MG tablet Take 1 tablet (25 mg total) by mouth daily. 09/28/14   Tonia Ghent, MD      Allergies    Metformin and related    Review of Systems   Review of Systems  Physical Exam Updated Vital Signs BP 125/67   Pulse 65   Temp 98.7 F (37.1 C)   Resp 14   Ht '5\' 10"'$  (1.778 m)   Wt 87.1 kg   SpO2 95%   BMI 27.55 kg/m  Physical Exam  ED Results / Procedures / Treatments   Labs (all labs ordered are listed, but only abnormal results are displayed) Labs Reviewed - No data to display  EKG None  Radiology DG Chest 2 View  Result Date: 09/19/2022 CLINICAL DATA:  Fall  with left chest pain EXAM: CHEST - 2 VIEW COMPARISON:  Chest radiograph dated 02/15/2022 FINDINGS: Normal lung volumes. No focal consolidations. No pleural effusion or pneumothorax. The heart size and mediastinal contours are within normal limits. The visualized skeletal structures are unremarkable. IMPRESSION: 1. Clear lungs.  Normal heart size. 2.  No radiographic finding of acute displaced fracture. Electronically Signed   By: Darrin Nipper M.D.   On: 09/19/2022 19:54    Procedures Procedures  {Document cardiac monitor, telemetry assessment procedure when appropriate:1}  Medications Ordered in ED Medications - No data to display  ED Course/ Medical Decision Making/ A&P                           Medical Decision Making  ***  {Document critical care time when appropriate:1} {Document review of labs and clinical decision tools ie heart  score, Chads2Vasc2 etc:1}  {Document your independent review of radiology images, and any outside records:1} {Document your discussion with family members, caretakers, and with consultants:1} {Document social determinants of health affecting pt's care:1} {Document your decision making why or why not admission, treatments were needed:1} Final Clinical Impression(s) / ED Diagnoses Final diagnoses:  None    Rx / DC Orders ED Discharge Orders     None

## 2022-10-14 ENCOUNTER — Encounter: Payer: Self-pay | Admitting: Family Medicine

## 2022-10-14 ENCOUNTER — Ambulatory Visit (INDEPENDENT_AMBULATORY_CARE_PROVIDER_SITE_OTHER): Payer: 59 | Admitting: Family Medicine

## 2022-10-14 VITALS — BP 116/60 | HR 88 | Temp 97.5°F | Wt 192.0 lb

## 2022-10-14 DIAGNOSIS — Z23 Encounter for immunization: Secondary | ICD-10-CM | POA: Diagnosis not present

## 2022-10-14 DIAGNOSIS — E1149 Type 2 diabetes mellitus with other diabetic neurological complication: Secondary | ICD-10-CM | POA: Diagnosis not present

## 2022-10-14 DIAGNOSIS — R0789 Other chest pain: Secondary | ICD-10-CM | POA: Diagnosis not present

## 2022-10-14 LAB — POCT GLYCOSYLATED HEMOGLOBIN (HGB A1C): Hemoglobin A1C: 7.5 % — AB (ref 4.0–5.6)

## 2022-10-14 MED ORDER — LANTUS SOLOSTAR 100 UNIT/ML ~~LOC~~ SOPN: 12.0000 [IU] | PEN_INJECTOR | Freq: Every day | SUBCUTANEOUS | Status: DC

## 2022-10-14 NOTE — Patient Instructions (Addendum)
Ask about getting a yearly visit set up in April with labs ahead of time.  Take care.  Glad to see you. Thanks for your effort.

## 2022-10-14 NOTE — Progress Notes (Unsigned)
Diabetes:  Using medications without difficulties:yes Hypoglycemic episodes:no Hyperglycemic episodes:no Feet problems: at baseline.   Blood Sugars averaging: usually ~130-150 eye exam within last year: yes A1c 7.5.    D/w pt about chest wall pain after prev trauma.  He likely repositioned his xiphoid, which was prev anteverted. Xiphoid was not palpable when he had local edema.  Now it is slightly more noted compared to then but not as prominent as distant prior.  Chest wall pain is getting better.    His father has memory loss.  Work is okay for patient.   Meds, vitals, and allergies reviewed.   ROS: Per HPI unless specifically indicated in ROS section   GEN: nad, alert and oriented HEENT: ncat NECK: supple w/o LA CV: rrr. PULM: ctab, no inc wob ABD: soft, +bs EXT: no edema SKIN: no acute rash

## 2022-10-16 NOTE — Assessment & Plan Note (Signed)
Continue Jardiance insulin metformin in the meantime.  His exercise has been affected by chest wall pain.  He is going to continue work on diet and exercise as best he can and we can recheck periodically.  He agrees to plan.

## 2022-10-16 NOTE — Assessment & Plan Note (Signed)
Improving.  Discussed with patient about recent and distant past x-rays regarding his xiphoid.  He is improving and I do not think it makes sense to do anything differently at this point.  He will update me as needed.

## 2022-12-02 ENCOUNTER — Other Ambulatory Visit: Payer: Self-pay | Admitting: Cardiology

## 2022-12-02 ENCOUNTER — Other Ambulatory Visit: Payer: Self-pay | Admitting: Family Medicine

## 2022-12-17 ENCOUNTER — Telehealth: Payer: Self-pay | Admitting: Family Medicine

## 2022-12-17 MED ORDER — LANTUS SOLOSTAR 100 UNIT/ML ~~LOC~~ SOPN: 12.0000 [IU] | PEN_INJECTOR | Freq: Every day | SUBCUTANEOUS | 3 refills | Status: DC

## 2022-12-17 NOTE — Telephone Encounter (Signed)
Erx sent

## 2022-12-17 NOTE — Addendum Note (Signed)
Addended by: Sherrilee Gilles B on: 12/17/2022 09:42 AM   Modules accepted: Orders

## 2022-12-17 NOTE — Telephone Encounter (Signed)
Prescription Request  12/17/2022  Is this a "Controlled Substance" medicine? No  LOV: 10/14/2022  What is the name of the medication or equipment? insulin glargine (LANTUS SOLOSTAR) 100 UNIT/ML Solostar Pen   Have you contacted your pharmacy to request a refill? No   Which pharmacy would you like this sent to?    [facility] [address], [place] [address] Phone: [phone] Fax: [phone]     Patient notified that their request is being sent to the clinical staff for review and that they should receive a response within 2 business days.   Please advise at Mobile [phone] (mobile)

## 2022-12-30 ENCOUNTER — Encounter: Payer: Self-pay | Admitting: Cardiology

## 2022-12-30 ENCOUNTER — Ambulatory Visit: Payer: 59 | Attending: Cardiology | Admitting: Cardiology

## 2022-12-30 VITALS — BP 118/72 | HR 73 | Ht 70.0 in | Wt 208.0 lb

## 2022-12-30 DIAGNOSIS — E781 Pure hyperglyceridemia: Secondary | ICD-10-CM | POA: Insufficient documentation

## 2022-12-30 DIAGNOSIS — E782 Mixed hyperlipidemia: Secondary | ICD-10-CM | POA: Diagnosis not present

## 2022-12-30 DIAGNOSIS — I2510 Atherosclerotic heart disease of native coronary artery without angina pectoris: Secondary | ICD-10-CM

## 2022-12-30 DIAGNOSIS — Z955 Presence of coronary angioplasty implant and graft: Secondary | ICD-10-CM | POA: Diagnosis not present

## 2022-12-30 DIAGNOSIS — Z7902 Long term (current) use of antithrombotics/antiplatelets: Secondary | ICD-10-CM

## 2022-12-30 DIAGNOSIS — R0789 Other chest pain: Secondary | ICD-10-CM | POA: Diagnosis not present

## 2022-12-30 DIAGNOSIS — I10 Essential (primary) hypertension: Secondary | ICD-10-CM

## 2022-12-30 DIAGNOSIS — I2119 ST elevation (STEMI) myocardial infarction involving other coronary artery of inferior wall: Secondary | ICD-10-CM | POA: Diagnosis not present

## 2022-12-30 MED ORDER — METOPROLOL TARTRATE 25 MG PO TABS: 25.0000 mg | ORAL_TABLET | Freq: Two times a day (BID) | ORAL | 3 refills | Status: DC

## 2022-12-30 MED ORDER — ROSUVASTATIN CALCIUM 40 MG PO TABS: 40.0000 mg | ORAL_TABLET | Freq: Every day | ORAL | 3 refills | Status: DC

## 2022-12-30 MED ORDER — LISINOPRIL 2.5 MG PO TABS: 2.5000 mg | ORAL_TABLET | Freq: Every day | ORAL | 3 refills | Status: DC

## 2022-12-30 MED ORDER — ICOSAPENT ETHYL 1 G PO CAPS: 2.0000 g | ORAL_CAPSULE | Freq: Two times a day (BID) | ORAL | 3 refills | Status: DC

## 2022-12-30 MED ORDER — TICAGRELOR 60 MG PO TABS: 60.0000 mg | ORAL_TABLET | Freq: Two times a day (BID) | ORAL | 3 refills | Status: DC

## 2022-12-30 MED ORDER — FENOFIBRATE 160 MG PO TABS: 160.0000 mg | ORAL_TABLET | Freq: Every day | ORAL | 3 refills | Status: DC

## 2022-12-30 NOTE — Progress Notes (Signed)
Primary Care Provider: Tonia Ghent, MD [place] Cardiologist: Glenetta Hew, MD Electrophysiologist: None  Clinic Note: Chief Complaint  Patient presents with   Follow-up    Over 1 year follow-up.  First in person visit in close to 2 years.   Coronary Artery Disease    No angina    ===================================  ASSESSMENT/PLAN   Problem List Items Addressed This Visit       Cardiology Problems   ST elevation myocardial infarction (STEMI) of inferior wall, subsequent episode of care [facility]) (Chronic)    He is now 9 years out from his MI.  Doing remarkably well.  EF improved following PCI up to 55 to 60%.  No recurrent angina or CHF symptoms.  On stable regimen.      Relevant Medications   lisinopril (ZESTRIL) 2.5 MG tablet   metoprolol tartrate (LOPRESSOR) 25 MG tablet   rosuvastatin (CRESTOR) 40 MG tablet   icosapent Ethyl (VASCEPA) 1 g capsule   fenofibrate 160 MG tablet   Other Relevant Orders   EKG 12-Lead (Completed)   Coronary artery disease involving native heart without angina pectoris - Primary (Chronic)    Single-vessel disease at the time of his MI with PCI to the RCA.  He has had 2 stress tests since then.  Felt to be nonischemic.  No active angina symptoms, but did have a episode of chest discomfort over the weekend.  I suggested that if he has more of this type of symptoms we may want to consider reassessing with a stress test..  Plan: Continue current dose of Lopressor 25 mg BID and lisinopril 2.5 mg daily Continue rosuvastatin 40 daily along with fenofibrate 160 mg daily. Convert from OTC fish oil to Vascepa 2 g twice daily Continue maintenance dose Brilinta 60 mg twice daily since he is tolerating well.  Low threshold to convert to monotherapy aspirin if he has any bleeding issues. Okay to hold Brilinta 5-7 days prior to surgeries or procedures. Continue Jardiance.      Relevant Medications   lisinopril (ZESTRIL) 2.5 MG tablet    metoprolol tartrate (LOPRESSOR) 25 MG tablet   rosuvastatin (CRESTOR) 40 MG tablet   icosapent Ethyl (VASCEPA) 1 g capsule   fenofibrate 160 MG tablet   Other Relevant Orders   EKG 12-Lead (Completed)   HYPERLIPIDEMIA, MIXED -- significant hypertriglyceridemia (Chronic)    Unfortunately, his last labs showed a direct LDL of 83 and still had elevated triglycerides. He had been seen by her CVRR pharmacy team and they were supposed to be adjusting meds, but he was lost to follow-up.  He is on 40 mg rosuvastatin plus fenofibrate 160 mg daily.    I will add Vascepa 2 g twice daily now and he would be due for having labs checked by PCP in April.  We can see if there is any benefit from this addition.  If not at goal, I think we need to strongly consider PCSK9 inhibitor.      Relevant Medications   lisinopril (ZESTRIL) 2.5 MG tablet   metoprolol tartrate (LOPRESSOR) 25 MG tablet   rosuvastatin (CRESTOR) 40 MG tablet   icosapent Ethyl (VASCEPA) 1 g capsule   fenofibrate 160 MG tablet   Essential hypertension (Chronic)    Uncontrolled BP on current dose of lisinopril and Lopressor.      Relevant Medications   lisinopril (ZESTRIL) 2.5 MG tablet   metoprolol tartrate (LOPRESSOR) 25 MG tablet   rosuvastatin (CRESTOR) 40 MG tablet  icosapent Ethyl (VASCEPA) 1 g capsule   fenofibrate 160 MG tablet   Hypertriglyceridemia   Relevant Medications   lisinopril (ZESTRIL) 2.5 MG tablet   metoprolol tartrate (LOPRESSOR) 25 MG tablet   rosuvastatin (CRESTOR) 40 MG tablet   icosapent Ethyl (VASCEPA) 1 g capsule   fenofibrate 160 MG tablet     Other   Presence of drug coated stent in right coronary artery: Promus Premier DES 3.0 mm x 18 mm (3.5 mm) to mid RCA occlusion 12/03/12 (Chronic)    Somewhat difficult RCA to intervene on.  Very tortuous.  He only had a single-vessel disease treated successfully with DES PCI.  Has been maintained on Brilinta 60 mg twice daily.  No bleeding issues and  tolerating it well.  For now we will decide to continue, but low threshold to convert to aspirin monotherapy.  Okay to hold Brilinta 5 to 7 days preop for surgical procedures.      Relevant Orders   EKG 12-Lead (Completed)   Chest wall pain   Relevant Orders   EKG 12-Lead (Completed)   Antiplatelet or antithrombotic long-term use    ===================================  HPI:     [name] [name] is a 62 y.o. male with a PMH below who presents today for Delayed annual f/u at the request of Tonia Ghent, MD.  [facility]  CAD-Inferior STEMI in January 2015-PCI to RCA,  HTN, HLD  [name] [name] was last seen via telemedicine on 09/28/2021 (I have not seen him in person since August 2019).  He had most recently been seen by Tarri Glenn, PA in November 2021 for preop colonoscopy..  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  No active cardiac symptoms. => Cleared for procedure and told to hold Brilinta 5 days preop.  Recent Hospitalizations:  September 20, 2022: [name] [name], ER for fall.  This occurred while walking out into his garage and tripped over a piece of wood.  He fell directly onto the trailer hit the front wall of his chest.  He had significant chest pain but no history of breathing.  Did not hit his head.  Reviewed  CV studies:    The following studies were reviewed today: (if available, images/films reviewed: From Epic Chart or Care Everywhere) None recently:  Interval History:   [name] [name] returns here today for his first in person visit in quite a long time.  For the most part is doing pretty well.  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  This allows him to do his work and go home.  He did a really good job with diet and lost about 30 pounds from the time of his initial MI.  His weight is up about 10+ pounds because over the last several months has had lots of things happen now.  He had  a fall in October where he hit his chest and took a while to recover from that.  Then a in the month of December his mother-in-law passed away and that was very difficult for his wife and therefore defective difficult for him.  This was superimposed on his father being recent diagnosed with Alzheimer's disease/dementia which was a big blow to him.  He had been doing pretty well but this past few months.  No real chest pain or pressure with rest or exertion.  This weekend though he had some weird tingling sensation with  pressure in his chest that lasted almost all day long.  It felt a lot like irritation of his sternal injury that he had back in October.  It was not likely CHF.  Not worse with exertion.  Otherwise, he has not had a real problems with exertional dyspnea, no PND orthopnea or edema.  CV Review of Symptoms (Summary): Cardiovascular ROS: positive for - chest pain and this is more musculoskeletal from his fall back in October.  Also some mild twinging sensations every now that during stressful moments. negative for - edema, irregular heartbeat, orthopnea, palpitations, paroxysmal nocturnal dyspnea, rapid heart rate, shortness of breath, or lightheadedness, dizziness or wooziness, syncope/near syncope or TIA/amaurosis fugax, claudication  REVIEWED OF SYSTEMS   Review of Systems  Constitutional:  Positive for weight loss (He had lost more weight than the currently shows now that he has gained back some over the October through December or January timeframe). Negative for malaise/fatigue.  HENT:  Negative for nosebleeds.   Respiratory:  Negative for shortness of breath.   Cardiovascular:  Positive for chest pain (From his fall back in October).  Gastrointestinal:  Negative for blood in stool and melena.  Genitourinary:  Negative for hematuria.  Musculoskeletal:  Negative for falls (No more falls since the episode in October.) and joint pain.  Neurological:  Negative for dizziness and focal  weakness.  Endo/Heme/Allergies:  Does not bruise/bleed easily.  Psychiatric/Behavioral:  Negative for memory loss. The patient is not nervous/anxious and does not have insomnia.        Little bit stressed and upset but not really depressed.  His mother-in-law passing and then his father being diagnosed with Alzheimer's was quite a blow for him.    I have reviewed and (if needed) personally updated the patient's problem list, medications, allergies, past medical and surgical history, social and family history.   PAST MEDICAL HISTORY   Past Medical History:  Diagnosis Date   Allergic rhinitis    CAD S/P percutaneous coronary angioplasty 12/03/2013   PCI-mRCA (100%-0%) => Xience Alpine DES 3.0 mm x 18 mm --> 3.5 mm; Echo 1/'15" EF 45-50%, mild C LVH, mild Inf HK, Gr 1 DD   Diabetes mellitus    type2   Fracture of right hand 1983   Hyperlipidemia    Hypertension    p t denies   Nocturia    Paresthesia    tingling in feet   STEMI (ST elevation myocardial infarction) of inf. wall 12/03/2013   p-mRCA 100% - DES PCI    PAST SURGICAL HISTORY   Past Surgical History:  Procedure Laterality Date   KNEE SURGERY     B arthroscopic   LEFT HEART CATH AND CORONARY ANGIOGRAPHY N/A 07/16/2018   Procedure: LEFT HEART CATH AND CORONARY ANGIOGRAPHY;  Surgeon: Belva Crome, MD;  Location: [facility];  Widely patent stent in dominant RCA.  Tortuous/widely patent wraparound LAD.  Normal/tortuous LCx.  EF 55%.  Inferior HK.   LEFT HEART CATHETERIZATION WITH CORONARY ANGIOGRAM N/A 12/03/2013   Procedure: LEFT HEART CATHETERIZATION WITH CORONARY ANGIOGRAM;  Surgeon: Leonie Man, MD;  Location: [facility] CATH LAB;; Large- Dom RCA - m100% (DES PCI) -<RPCA & RPAV-PLx3; Large-Long LM-< LAD, RI x 2 & LCx - mild Prox LAD, otw normal LCA System.   NM MYOVIEW LTD  06/2018   a) 11/2015: Normal; b) 7/20198: EF 51%.  Findings consistent with prior MI with peri-infarct ischemia (LOW RISK).  Small defect of  mild severity in  the basal inferior mid inferior location.  There is also horizontal ST segment depression in inferior leads.  Although low risk images, the EKG changes are concerning.  There is concern for possible multivessel CAD.   PERCUTANEOUS CORONARY STENT INTERVENTION (PCI-S)  12/03/2013   mRCA - Xience Alpine DES 3.0 mm x 18 mm --> 3.5 mm   SEPTOPLASTY  1970   due to Fx Nose   TRANSTHORACIC ECHOCARDIOGRAM  11/2014   a) 12/2013 (post Inf STEMI). Mild Conc LVH; EF 45-50%, Mild Inferior HK. Grade 1 DD;; b) 11/2014. EF 55-60%. No RWMA. Moderate RV dilation but normal function.   Cardiac Cath-July 16, 2018: Widely patent stent in dominant RCA.  Tortuous/widely patent wraparound LAD.  Normal/tortuous LCx.  EF 55%.  Inferior HK   Immunization History  Administered Date(s) Administered   Influenza Split 10/07/2012   Influenza Whole 10/18/2002   Influenza,inj,Quad PF,6+ Mos 10/19/2013, 09/28/2014, 10/05/2015, 09/06/2016, 10/29/2017, 09/27/2021, 10/14/2022   Influenza-Unspecified 09/01/2018, 11/14/2020   PFIZER(Purple Top)SARS-COV-2 Vaccination 02/19/2020, 03/14/2020   Pneumococcal Polysaccharide-23 11/11/2013   Td 08/02/2004   Tdap 09/28/2014    MEDICATIONS/ALLERGIES   Current Meds  Medication Sig   B-D UF [name] MINI PEN NEEDLES 31G X 5 MM MISC USE AS DIRECTED WITH INSULIN PEN   BRILINTA 60 MG TABS tablet TAKE 1 TABLET BY MOUTH TWICE  DAILY   cyclobenzaprine (FLEXERIL) 10 MG tablet Take 1 tablet (10 mg total) by mouth 2 (two) times daily as needed for muscle spasms.   empagliflozin (JARDIANCE) 10 MG TABS tablet Take 1 tablet (10 mg total) by mouth daily before breakfast.   fenofibrate 160 MG tablet TAKE 1 TABLET BY MOUTH DAILY   folic acid (FOLVITE) 297 MCG tablet TAKE 1 TABLET BY MOUTH  DAILY   insulin glargine (LANTUS SOLOSTAR) 100 UNIT/ML Solostar Pen Inject 12-15 Units into the skin daily.   lisinopril (ZESTRIL) 2.5 MG tablet TAKE 1 TABLET BY MOUTH DAILY   metFORMIN (GLUCOPHAGE)  500 MG tablet TAKE 1 TABLET BY MOUTH  TWICE DAILY WITH MEALS   metoprolol tartrate (LOPRESSOR) 25 MG tablet TAKE 1 TABLET BY MOUTH  TWICE DAILY   nitroGLYCERIN (NITROSTAT) 0.4 MG SL tablet DISSOLVE 1 TABLET UNDER THE TONGUE EVERY 5 MINUTES AS  NEEDED FOR CHEST PAIN. MAX  OF 3 TABLETS IN 15 MINUTES. CALL 911 IF PAIN PERSISTS.   Omega-3 Fatty Acids (FISH OIL) 1000 MG CAPS Take 2 capsules (2,000 mg total) by mouth in the morning and at bedtime.   ONETOUCH DELICA LANCETS 98X MISC Use as instructed to check blood sugar once daily or as needed.  Diagnosis:  E11.49  Non insulin dependent.   ONETOUCH VERIO test strip TEST ONCE DAILY AND AS NEEDED   rosuvastatin (CRESTOR) 40 MG tablet TAKE 1 TABLET BY MOUTH DAILY   vitamin B-12 (CYANOCOBALAMIN) 1000 MCG tablet Take 1 tablet (1,000 mcg total) by mouth daily.   vitamin B-6 (PYRIDOXINE) 25 MG tablet Take 1 tablet (25 mg total) by mouth daily.    Allergies  Allergen Reactions   Metformin And Related     Intolerant of more than '1000mg'$  a day    SOCIAL HISTORY/FAMILY HISTORY   Reviewed in Epic:  Pertinent findings:  Social History   Tobacco Use   Smoking status: Former    Packs/day: 0.50    Years: 20.00    Total pack years: 10.00    Types: Cigarettes    Quit date: 12/03/2013    Years since quitting: 9.0   Smokeless tobacco:  Never  Vaping Use   Vaping Use: Never used  Substance Use Topics   Alcohol use: Yes    Comment: occ on weekends   Drug use: No   Social History   Social History Narrative   Happily married 1988 and lives with wife   One son   Works at Museum/gallery exhibitions officer, Librarian, academic.   Forward 10 pack year smoker, who quit on the day of his MI    OBJCTIVE -PE, EKG, labs   Wt Readings from Last 3 Encounters:  12/30/22 208 lb (94.3 kg)  10/14/22 192 lb (87.1 kg)  09/19/22 192 lb (87.1 kg)  07/2018 - 214 lb  Physical Exam: BP 118/72   Pulse 73   Ht '5\' 10"'$  (1.778 m)   Wt 208 lb (94.3 kg)   SpO2 94%   BMI 29.84 kg/m   Physical Exam Vitals reviewed.  Constitutional:      General: He is not in acute distress.    Appearance: Normal appearance. He is obese. He is not ill-appearing (Healthy) or toxic-appearing.     Comments: Borderline obese.  Otherwise well-nourished and well-groomed.  HENT:     Head: Normocephalic and atraumatic.  Neck:     Vascular: No carotid bruit or JVD.  Cardiovascular:     Rate and Rhythm: Normal rate and regular rhythm. No extrasystoles are present.    Chest Wall: PMI is not displaced.     Pulses: Normal pulses.     Heart sounds: Normal heart sounds, S1 normal and S2 normal. No murmur heard.    No friction rub. No gallop.  Pulmonary:     Effort: Pulmonary effort is normal. No respiratory distress.     Breath sounds: Normal breath sounds. No wheezing, rhonchi or rales.  Chest:     Chest wall: Tenderness (He has bilateral sternal tenderness which is where he hit his chest) present.  Musculoskeletal:        General: No swelling. Normal range of motion.     Cervical back: Normal range of motion and neck supple.  Skin:    General: Skin is warm and dry.  Neurological:     General: No focal deficit present.     Mental Status: He is alert and oriented to person, place, and time.  Psychiatric:        Mood and Affect: Mood normal.        Behavior: Behavior normal.        Thought Content: Thought content normal.        Judgment: Judgment normal.     Adult ECG Report  Rate: 73 ;  Rhythm: normal sinus rhythm and nonspecific ST and T wave changes. ;  Normal axis, intervals and durations.  Narrative Interpretation: Stable  Recent Labs:  reviewed - due for f/u labs in April  Lab Results  Component Value Date   CHOL 172 03/05/2022   HDL 37.70 (L) 03/05/2022   LDLCALC Comment 01/22/2018   LDLDIRECT 83.0 03/05/2022   TRIG 265.0 (H) 03/05/2022   CHOLHDL 5 03/05/2022   Lab Results  Component Value Date   CREATININE 0.84 03/05/2022   BUN 15 03/05/2022   NA 138 03/05/2022   K  4.5 03/05/2022   CL 102 03/05/2022   CO2 25 03/05/2022      Latest Ref Rng & Units 03/05/2022    7:39 AM 02/15/2022    3:05 PM 03/27/2021    7:36 AM  CBC  WBC 4.0 - 10.5 K/uL 5.6  7.9  6.0   Hemoglobin 13.0 - 17.0 g/dL 14.2  14.1  13.4   Hematocrit 39.0 - 52.0 % 41.3  41.9  39.6   Platelets 150.0 - 400.0 K/uL 236.0  220  259.0     Lab Results  Component Value Date   HGBA1C 7.5 (A) 10/14/2022   Lab Results  Component Value Date   TSH 1.20 03/05/2022    ================================================== I spent a total of 27 minutes with the patient spent in direct patient consultation.  Additional time spent with chart review  / charting (studies, outside notes, etc): 16 min Total Time: 43 min  Current medicines are reviewed at length with the patient today.  (+/- concerns) n./a  Notice: This dictation was prepared with Dragon dictation along with smart phrase technology. Any transcriptional errors that result from this process are unintentional and may not be corrected upon review.  Studies Ordered:   Orders Placed This Encounter  Procedures   EKG 12-Lead   Meds ordered this encounter  Medications   ticagrelor (BRILINTA) 60 MG TABS tablet    Sig: Take 1 tablet (60 mg total) by mouth 2 (two) times daily.    Dispense:  180 tablet    Refill:  3   lisinopril (ZESTRIL) 2.5 MG tablet    Sig: Take 1 tablet (2.5 mg total) by mouth daily.    Dispense:  90 tablet    Refill:  3   metoprolol tartrate (LOPRESSOR) 25 MG tablet    Sig: Take 1 tablet (25 mg total) by mouth 2 (two) times daily.    Dispense:  180 tablet    Refill:  3   rosuvastatin (CRESTOR) 40 MG tablet    Sig: Take 1 tablet (40 mg total) by mouth daily.    Dispense:  90 tablet    Refill:  3    PLEASE SCHEDULE AN APPT FOR FUTURE REFILLS   icosapent Ethyl (VASCEPA) 1 g capsule    Sig: Take 2 capsules (2 g total) by mouth 2 (two) times daily.    Dispense:  360 capsule    Refill:  3   fenofibrate 160 MG tablet     Sig: Take 1 tablet (160 mg total) by mouth daily.    Dispense:  90 tablet    Refill:  3    Patient Instructions / Medication Changes & Studies & Tests Ordered   Patient Instructions  Medication Instructions:   Vascepa 2 gm twice  a day  *If you need a refill on your cardiac medications before your next appointment, please call your pharmacy*   Lab Work: Please have labs done march with primary  If you have labs (blood work) drawn today and your tests are completely normal, you will receive your results only by: MyChart Message (if you have MyChart) OR A paper copy in the mail If you have any lab test that is abnormal or we need to change your treatment, we will call you to review the results.   Testing/Procedures: Not needed   Follow-Up: At [facility], you and your health needs are our priority.  As part of our continuing mission to provide you with exceptional heart care, we have created designated Provider Care Teams.  These Care Teams include your primary Cardiologist (physician) and Advanced Practice Providers (APPs -  Physician Assistants and Nurse Practitioners) who all work together to provide you with the care you need, when you need it.     Your next appointment:   12  month(s)  The format for your next appointment:   In Person  Provider:   Glenetta Hew, MD    Other Instructions   If you have another chest episode let office not we will schedule a stress test.     Leonie Man, MD, MS Glenetta Hew, M.D., M.S. Interventional Cardiologist  [place]  Pager # [phone] Phone # [phone]. [place], [place] [address]   Thank you for choosing [facility]!!

## 2022-12-30 NOTE — Patient Instructions (Signed)
Medication Instructions:   Vascepa 2 gm twice  a day  *If you need a refill on your cardiac medications before your next appointment, please call your pharmacy*   Lab Work: Please have labs done march with primary  If you have labs (blood work) drawn today and your tests are completely normal, you will receive your results only by: [employer] (if you have MyChart) OR A paper copy in the mail If you have any lab test that is abnormal or we need to change your treatment, we will call you to review the results.   Testing/Procedures: Not needed   Follow-Up: At [facility], you and your health needs are our priority.  As part of our continuing mission to provide you with exceptional heart care, we have created designated Provider Care Teams.  These Care Teams include your primary Cardiologist (physician) and Advanced Practice Providers (APPs -  Physician Assistants and Nurse Practitioners) who all work together to provide you with the care you need, when you need it.     Your next appointment:   12 month(s)  The format for your next appointment:   In Person  Provider:   Glenetta Hew, MD    Other Instructions   If you have another chest episode let office not we will schedule a stress test.

## 2023-01-05 ENCOUNTER — Encounter: Payer: Self-pay | Admitting: Cardiology

## 2023-01-05 NOTE — Assessment & Plan Note (Addendum)
Single-vessel disease at the time of his MI with PCI to the RCA.  He has had 2 stress tests since then.  Felt to be nonischemic.  No active angina symptoms, but did have a episode of chest discomfort over the weekend.  I suggested that if he has more of this type of symptoms we may want to consider reassessing with a stress test..  Plan: Continue current dose of Lopressor 25 mg BID and lisinopril 2.5 mg daily Continue rosuvastatin 40 daily along with fenofibrate 160 mg daily. Convert from OTC fish oil to Vascepa 2 g twice daily Continue maintenance dose Brilinta 60 mg twice daily since he is tolerating well.  Low threshold to convert to monotherapy aspirin if he has any bleeding issues. Okay to hold Brilinta 5-7 days prior to surgeries or procedures. Continue Jardiance.

## 2023-01-05 NOTE — Assessment & Plan Note (Signed)
Somewhat difficult RCA to intervene on.  Very tortuous.  He only had a single-vessel disease treated successfully with DES PCI.  Has been maintained on Brilinta 60 mg twice daily.  No bleeding issues and tolerating it well.  For now we will decide to continue, but low threshold to convert to aspirin monotherapy.  Okay to hold Brilinta 5 to 7 days preop for surgical procedures.

## 2023-01-05 NOTE — Assessment & Plan Note (Signed)
He is now 9 years out from his MI.  Doing remarkably well.  EF improved following PCI up to 55 to 60%.  No recurrent angina or CHF symptoms.  On stable regimen.

## 2023-01-05 NOTE — Assessment & Plan Note (Signed)
Uncontrolled BP on current dose of lisinopril and Lopressor.

## 2023-01-05 NOTE — Assessment & Plan Note (Signed)
Unfortunately, his last labs showed a direct LDL of 83 and still had elevated triglycerides. He had been seen by her CVRR pharmacy team and they were supposed to be adjusting meds, but he was lost to follow-up.  He is on 40 mg rosuvastatin plus fenofibrate 160 mg daily.    I will add Vascepa 2 g twice daily now and he would be due for having labs checked by PCP in April.  We can see if there is any benefit from this addition.  If not at goal, I think we need to strongly consider PCSK9 inhibitor.

## 2023-02-21 ENCOUNTER — Other Ambulatory Visit: Payer: Self-pay | Admitting: Family Medicine

## 2023-02-21 DIAGNOSIS — E1149 Type 2 diabetes mellitus with other diabetic neurological complication: Secondary | ICD-10-CM

## 2023-03-10 ENCOUNTER — Other Ambulatory Visit (INDEPENDENT_AMBULATORY_CARE_PROVIDER_SITE_OTHER): Payer: 59

## 2023-03-10 DIAGNOSIS — E1149 Type 2 diabetes mellitus with other diabetic neurological complication: Secondary | ICD-10-CM

## 2023-03-10 LAB — COMPREHENSIVE METABOLIC PANEL
ALT: 20 U/L (ref 0–53)
AST: 17 U/L (ref 0–37)
Albumin: 4.6 g/dL (ref 3.5–5.2)
Alkaline Phosphatase: 42 U/L (ref 39–117)
BUN: 11 mg/dL (ref 6–23)
CO2: 26 mEq/L (ref 19–32)
Calcium: 9.6 mg/dL (ref 8.4–10.5)
Chloride: 105 mEq/L (ref 96–112)
Creatinine, Ser: 0.78 mg/dL (ref 0.40–1.50)
GFR: 95.02 mL/min (ref >60.00)
Glucose, Bld: 148 mg/dL — ABNORMAL HIGH (ref 70–99)
Potassium: 4.6 mEq/L (ref 3.5–5.1)
Sodium: 139 mEq/L (ref 135–145)
Total Bilirubin: 0.5 mg/dL (ref 0.2–1.2)
Total Protein: 6.8 g/dL (ref 6.0–8.3)

## 2023-03-10 LAB — CBC WITH DIFFERENTIAL/PLATELET
Basophils Absolute: 0 10*3/uL (ref 0.0–0.1)
Basophils Relative: 0.7 % (ref 0.0–3.0)
Eosinophils Absolute: 0.3 10*3/uL (ref 0.0–0.7)
Eosinophils Relative: 4.2 % (ref 0.0–5.0)
HCT: 42.3 % (ref 39.0–52.0)
Hemoglobin: 14.4 g/dL (ref 13.0–17.0)
Lymphocytes Relative: 38 % (ref 12.0–46.0)
Lymphs Abs: 2.3 10*3/uL (ref 0.7–4.0)
MCHC: 34 g/dL (ref 30.0–36.0)
MCV: 86.2 fl (ref 78.0–100.0)
Monocytes Absolute: 0.5 10*3/uL (ref 0.1–1.0)
Monocytes Relative: 8.7 % (ref 3.0–12.0)
Neutro Abs: 3 10*3/uL (ref 1.4–7.7)
Neutrophils Relative %: 48.4 % (ref 43.0–77.0)
Platelets: 222 10*3/uL (ref 150.0–400.0)
RBC: 4.91 Mil/uL (ref 4.22–5.81)
RDW: 14.7 % (ref 11.5–15.5)
WBC: 6.1 10*3/uL (ref 4.0–10.5)

## 2023-03-10 LAB — MICROALBUMIN / CREATININE URINE RATIO
Creatinine,U: 67.1 mg/dL
Microalb Creat Ratio: 3 mg/g (ref 0.0–30.0)
Microalb, Ur: 2 mg/dL — ABNORMAL HIGH (ref 0.0–1.9)

## 2023-03-10 LAB — LIPID PANEL
Cholesterol: 174 mg/dL (ref 0–200)
HDL: 46 mg/dL (ref >39.00)
LDL Cholesterol: 100 mg/dL — ABNORMAL HIGH (ref 0–99)
NonHDL: 127.65
Total CHOL/HDL Ratio: 4
Triglycerides: 140 mg/dL (ref 0.0–149.0)
VLDL: 28 mg/dL (ref 0.0–40.0)

## 2023-03-10 LAB — VITAMIN B12: Vitamin B-12: 508 pg/mL (ref 211–911)

## 2023-03-10 LAB — HEMOGLOBIN A1C: Hgb A1c MFr Bld: 7.9 % — ABNORMAL HIGH (ref 4.6–6.5)

## 2023-03-10 LAB — TSH: TSH: 0.9 u[IU]/mL (ref 0.35–5.50)

## 2023-03-17 ENCOUNTER — Ambulatory Visit (INDEPENDENT_AMBULATORY_CARE_PROVIDER_SITE_OTHER): Payer: 59 | Admitting: Family Medicine

## 2023-03-17 VITALS — BP 138/60 | HR 72 | Temp 98.0°F | Ht 70.0 in | Wt 191.0 lb

## 2023-03-17 DIAGNOSIS — E1149 Type 2 diabetes mellitus with other diabetic neurological complication: Secondary | ICD-10-CM | POA: Diagnosis not present

## 2023-03-17 DIAGNOSIS — E781 Pure hyperglyceridemia: Secondary | ICD-10-CM

## 2023-03-17 MED ORDER — LANTUS SOLOSTAR 100 UNIT/ML ~~LOC~~ SOPN: 14.0000 [IU] | PEN_INJECTOR | Freq: Every day | SUBCUTANEOUS | 3 refills | Status: DC

## 2023-03-17 NOTE — Assessment & Plan Note (Signed)
He hasn't been able to get vascepa filled in the meantime.  I need update from Dr. Herbie Baltimore about getting that filled.  Will route a copy of labs/note to cardiology.  I appreciate the help of all involved.

## 2023-03-17 NOTE — Progress Notes (Signed)
Diabetes:  Using medications without difficulties: he has mild stinging with urination.  Going on for a few weeks.  No discharge.   Hypoglycemic episodes: no Hyperglycemic episodes: no Feet problems: at baseline.  Blood Sugars averaging: rarely >200.  Usually ~140 eye exam within last year: yes Prev labs d/w pt.  15 units insulin, plan to increase to 16 units and then by an additional unit per day if needed. D/w pt about diet.   Cramping resolved.    He hasn't been able to get vascepa filled in the meantime.  I need update from Dr. Herbie Baltimore about getting that filled.  Will route a copy of labs/note to cardiology.  His mother in law passed 11/27/22.  D/w pt, condolences offered.  His father has memory loss.  Discussed.  I thanked him for his effort caring for his father.  Meds, vitals, and allergies reviewed.  ROS: Per HPI unless specifically indicated in ROS section   GEN: nad, alert and oriented HEENT: ncat NECK: supple w/o LA CV: rrr. PULM: ctab, no inc wob ABD: soft, +bs EXT: no edema SKIN: no acute rash  Recent Results (from the past 2160 hour(s))  Microalbumin / creatinine urine ratio     Status: Abnormal   Collection Time: 03/10/23  7:33 AM  Result Value Ref Range   Microalb, Ur 2.0 (H) 0.0 - 1.9 mg/dL   Creatinine,U 88.1 mg/dL   Microalb Creat Ratio 3.0 0.0 - 30.0 mg/g  Hemoglobin A1c     Status: Abnormal   Collection Time: 03/10/23  7:33 AM  Result Value Ref Range   Hgb A1c MFr Bld 7.9 (H) 4.6 - 6.5 %    Comment: Glycemic Control Guidelines for People with Diabetes:Non Diabetic:  <6%Goal of Therapy: <7%Additional Action Suggested:  >8%   Vitamin B12     Status: None   Collection Time: 03/10/23  7:33 AM  Result Value Ref Range   Vitamin B-12 508 211 - 911 pg/mL  TSH     Status: None   Collection Time: 03/10/23  7:33 AM  Result Value Ref Range   TSH 0.90 0.35 - 5.50 uIU/mL  Lipid panel     Status: Abnormal   Collection Time: 03/10/23  7:33 AM  Result Value Ref  Range   Cholesterol 174 0 - 200 mg/dL    Comment: ATP III Classification       Desirable:  < 200 mg/dL               Borderline High:  200 - 239 mg/dL          High:  > = 103 mg/dL   Triglycerides 159.4 0.0 - 149.0 mg/dL    Comment: Normal:  <585 mg/dLBorderline High:  150 - 199 mg/dL   HDL 92.92 >44.62 mg/dL   VLDL 86.3 0.0 - 81.7 mg/dL   LDL Cholesterol 711 (H) 0 - 99 mg/dL   Total CHOL/HDL Ratio 4     Comment:                Men          Women1/2 Average Risk     3.4          3.3Average Risk          5.0          4.42X Average Risk          9.6          7.13X Average Risk  15.0          11.0                       NonHDL 127.65     Comment: NOTE:  Non-HDL goal should be 30 mg/dL higher than patient's LDL goal (i.e. LDL goal of < 70 mg/dL, would have non-HDL goal of < 100 mg/dL)  CBC with Differential/Platelet     Status: None   Collection Time: 03/10/23  7:33 AM  Result Value Ref Range   WBC 6.1 4.0 - 10.5 K/uL   RBC 4.91 4.22 - 5.81 Mil/uL   Hemoglobin 14.4 13.0 - 17.0 g/dL   HCT 45.8 59.2 - 92.4 %   MCV 86.2 78.0 - 100.0 fl   MCHC 34.0 30.0 - 36.0 g/dL   RDW 46.2 86.3 - 81.7 %   Platelets 222.0 150.0 - 400.0 K/uL   Neutrophils Relative % 48.4 43.0 - 77.0 %   Lymphocytes Relative 38.0 12.0 - 46.0 %   Monocytes Relative 8.7 3.0 - 12.0 %   Eosinophils Relative 4.2 0.0 - 5.0 %   Basophils Relative 0.7 0.0 - 3.0 %   Neutro Abs 3.0 1.4 - 7.7 K/uL   Lymphs Abs 2.3 0.7 - 4.0 K/uL   Monocytes Absolute 0.5 0.1 - 1.0 K/uL   Eosinophils Absolute 0.3 0.0 - 0.7 K/uL   Basophils Absolute 0.0 0.0 - 0.1 K/uL  Comprehensive metabolic panel     Status: Abnormal   Collection Time: 03/10/23  7:33 AM  Result Value Ref Range   Sodium 139 135 - 145 mEq/L   Potassium 4.6 3.5 - 5.1 mEq/L   Chloride 105 96 - 112 mEq/L   CO2 26 19 - 32 mEq/L   Glucose, Bld 148 (H) 70 - 99 mg/dL   BUN 11 6 - 23 mg/dL   Creatinine, Ser 7.11 0.40 - 1.50 mg/dL   Total Bilirubin 0.5 0.2 - 1.2 mg/dL   Alkaline  Phosphatase 42 39 - 117 U/L   AST 17 0 - 37 U/L   ALT 20 0 - 53 U/L   Total Protein 6.8 6.0 - 8.3 g/dL   Albumin 4.6 3.5 - 5.2 g/dL   GFR 65.79 >03.83 mL/min    Comment: Calculated using the CKD-EPI Creatinine Equation (2021)   Calcium 9.6 8.4 - 10.5 mg/dL   30 minutes were devoted to patient care in this encounter (this includes time spent reviewing the patient's file/history, interviewing and examining the patient, counseling/reviewing plan with patient).

## 2023-03-17 NOTE — Assessment & Plan Note (Signed)
Prev labs d/w pt.  15 units insulin, plan to increase to 16 units and then by an additional unit per day if needed. D/w pt about diet.   Cramping resolved.   Stop Jardiance for now given dysuria.  If it resolves he can attempt restarting medication.  See after visit summary.  Continue metformin.  Recheck periodically.

## 2023-03-17 NOTE — Patient Instructions (Addendum)
Stop jardiance and see if the urination symptoms are better.  If not better, then let me know.   If the urinary symptoms are better/resolved for 1 week, then restart med.   You'll likely need extra insulin in the meantime.  Increase 1 unit per day, if AM sugar is above 160.    Take care.  Glad to see you.

## 2023-05-11 ENCOUNTER — Other Ambulatory Visit: Payer: Self-pay | Admitting: Family Medicine

## 2023-05-15 ENCOUNTER — Encounter: Payer: Self-pay | Admitting: Family Medicine

## 2023-05-15 ENCOUNTER — Encounter: Payer: Self-pay | Admitting: Cardiology

## 2023-05-15 ENCOUNTER — Other Ambulatory Visit: Payer: Self-pay

## 2023-05-20 MED ORDER — ICOSAPENT ETHYL 1 G PO CAPS: 2.0000 g | ORAL_CAPSULE | Freq: Two times a day (BID) | ORAL | 3 refills | Status: DC

## 2023-05-21 ENCOUNTER — Telehealth: Payer: Self-pay

## 2023-05-21 NOTE — Telephone Encounter (Signed)
Will forward to Dr Harding for review  

## 2023-05-21 NOTE — Telephone Encounter (Signed)
[facility] mail order pharmacy is requesting an alternative for medication vascepa capsules. Pharmacy is stating that medication is not covered under pt's insurance and would like to know if Dr. Herbie Baltimore would like to reorder an alternative or get a prior auth for this medicaiton. The alternatives are Crestor, Zocor, Lovaza, Lofibra/Tricor.  Ph# [phone]   Order# [id number]  please address

## 2023-05-28 NOTE — Telephone Encounter (Signed)
Would try Lovaza [facility]

## 2023-05-29 ENCOUNTER — Ambulatory Visit (INDEPENDENT_AMBULATORY_CARE_PROVIDER_SITE_OTHER): Payer: 59 | Admitting: Podiatry

## 2023-05-29 ENCOUNTER — Ambulatory Visit (INDEPENDENT_AMBULATORY_CARE_PROVIDER_SITE_OTHER): Payer: 59

## 2023-05-29 ENCOUNTER — Encounter: Payer: Self-pay | Admitting: Podiatry

## 2023-05-29 DIAGNOSIS — M79671 Pain in right foot: Secondary | ICD-10-CM

## 2023-05-29 DIAGNOSIS — M722 Plantar fascial fibromatosis: Secondary | ICD-10-CM

## 2023-05-29 MED ORDER — TRIAMCINOLONE ACETONIDE 10 MG/ML IJ SUSP
10.0000 mg | Freq: Once | INTRAMUSCULAR | Status: AC
Start: 2023-05-29 — End: 2023-05-29
  Administered 2023-05-29: 10 mg

## 2023-05-29 NOTE — Patient Instructions (Addendum)

## 2023-05-29 NOTE — Progress Notes (Signed)
Subjective:   Patient ID: [name] [name], male   DOB: 62 y.o.   MRN: [id number]   HPI Patient presents stating he is developed a lot of pain in the bottom of his right heel and it has been going on for around a month and worse gradually over that time.  Very sore to walk on and after periods of sitting   ROS      Objective:  Physical Exam  No neurovascular status intact exquisite discomfort noted medial band right plantar fascia at the insertional point tendon calcaneus     Assessment:  Acute Planter fasciitis right inflammation fluid medial band     Plan:  H&P x-rays taken sterile prep injected the plantar fascia at insertion 3 mg Kenalog 5 mg Xylocaine dispensed fascial brace with all instructions on usage fitted properly into his arch advised on ice support shoes and reappoint to recheck  X-rays indicate large spur no indication stress fracture arthritis

## 2023-05-30 MED ORDER — OMEGA-3-ACID ETHYL ESTERS 1 G PO CAPS: 2.0000 g | ORAL_CAPSULE | Freq: Two times a day (BID) | ORAL | 3 refills | Status: DC

## 2023-05-30 NOTE — Telephone Encounter (Signed)
New order placed and e-sent to pharmacy  - changed to Lovaza 2 gm  twice a day . Vascepa discontinue

## 2023-06-10 ENCOUNTER — Other Ambulatory Visit: Payer: Self-pay | Admitting: Family Medicine

## 2023-06-16 ENCOUNTER — Encounter: Payer: Self-pay | Admitting: Family Medicine

## 2023-06-16 ENCOUNTER — Ambulatory Visit (INDEPENDENT_AMBULATORY_CARE_PROVIDER_SITE_OTHER): Admission: RE | Admit: 2023-06-16 | Payer: 59 | Source: Ambulatory Visit

## 2023-06-16 ENCOUNTER — Ambulatory Visit: Payer: 59 | Admitting: Family Medicine

## 2023-06-16 VITALS — BP 120/62 | HR 75 | Temp 98.1°F | Ht 70.0 in | Wt 185.0 lb

## 2023-06-16 DIAGNOSIS — Z7984 Long term (current) use of oral hypoglycemic drugs: Secondary | ICD-10-CM

## 2023-06-16 DIAGNOSIS — M79673 Pain in unspecified foot: Secondary | ICD-10-CM | POA: Diagnosis not present

## 2023-06-16 DIAGNOSIS — M79671 Pain in right foot: Secondary | ICD-10-CM

## 2023-06-16 DIAGNOSIS — E1149 Type 2 diabetes mellitus with other diabetic neurological complication: Secondary | ICD-10-CM

## 2023-06-16 DIAGNOSIS — Z794 Long term (current) use of insulin: Secondary | ICD-10-CM

## 2023-06-16 LAB — POCT GLYCOSYLATED HEMOGLOBIN (HGB A1C): Hemoglobin A1C: 7.1 % — AB (ref 4.0–5.6)

## 2023-06-16 MED ORDER — LANTUS SOLOSTAR 100 UNIT/ML ~~LOC~~ SOPN: 11.0000 [IU] | PEN_INJECTOR | Freq: Every day | SUBCUTANEOUS | Status: DC

## 2023-06-16 NOTE — Patient Instructions (Signed)
Recheck in about 3 months.  A1c at the visit.  Xray today.  Take care.  Glad to see you.

## 2023-06-16 NOTE — Progress Notes (Unsigned)
Diabetes:  Using medications without difficulties: yes Hypoglycemic episodes: no sx Hyperglycemic episodes: no sx Feet problems: at baseline.  Blood Sugars averaging: usually ~ 130s or lower in the AMs eye exam within last year: yes A1c 7.1.  d/w pt at OV.  Improved from prior.   He adjusted his insulin lower.  Down to 11 units.    R foot pain.  Was carrying water and tried to make a step down yesterday, felt a pop.  Medial R calcaneal pain.   His father has memory loss, family is caring for him.    Meds, vitals, and allergies reviewed.  ROS: Per HPI unless specifically indicated in ROS section   GEN: nad, alert and oriented HEENT: mucous membranes moist NECK: supple w/o LA CV: rrr. PULM: ctab, no inc wob ABD: soft, +bs EXT: no edema SKIN: no acute rash  R calcaneus ttp medially.  Achilles not tender.  Medial and lateral malleolus are nontender.  Able to bear weight.  No rash or bruising.  X-ray discussed with patient at office visit.  No fracture seen.  25 minutes were devoted to patient care in this encounter (this includes time spent reviewing the patient's file/history, interviewing and examining the patient, counseling/reviewing plan with patient).

## 2023-06-18 ENCOUNTER — Encounter: Payer: Self-pay | Admitting: Podiatry

## 2023-06-18 ENCOUNTER — Ambulatory Visit: Payer: 59 | Admitting: Podiatry

## 2023-06-18 DIAGNOSIS — M722 Plantar fascial fibromatosis: Secondary | ICD-10-CM | POA: Diagnosis not present

## 2023-06-18 DIAGNOSIS — T148XXA Other injury of unspecified body region, initial encounter: Secondary | ICD-10-CM

## 2023-06-18 DIAGNOSIS — M79673 Pain in unspecified foot: Secondary | ICD-10-CM | POA: Insufficient documentation

## 2023-06-18 MED ORDER — DICLOFENAC SODIUM 75 MG PO TBEC
75.0000 mg | DELAYED_RELEASE_TABLET | Freq: Two times a day (BID) | ORAL | 2 refills | Status: DC
Start: 2023-06-18 — End: 2023-09-18

## 2023-06-18 NOTE — Progress Notes (Signed)
Subjective:   Patient ID: [name] [name], male   DOB: 62 y.o.   MRN: [id number]   HPI Patient presents with pain in the right heel that has not resolved with conservative treatment   Review of Systems  All other systems reviewed and are negative.       Objective:  Physical Exam Vitals and nursing note reviewed.  Constitutional:      Appearance: He is well-developed.  Pulmonary:     Effort: Pulmonary effort is normal.  Musculoskeletal:        General: Normal range of motion.  Skin:    General: Skin is warm.  Neurological:     Mental Status: He is alert.     Neurovascular status intact muscle strength with patient found to have intense discomfort plantar proximal portion of the right heel with inability to walk on the foot well     Assessment:  Acute Planter fasciitis right that did not respond conservatively     Plan:  H&P reviewed at this point sterile prep injected the plantar fascia more proximal 3 mg Dexasone Kenalog 5 mg Xylocaine applied air fracture walker to take all pressure off the plantar heel and placed on diclofenac.  He does take medicine that may interact and will talk to his pharmacist before he will start it.  Reappoint 4 weeks

## 2023-06-18 NOTE — Assessment & Plan Note (Signed)
No fracture seen on x-ray.  Await over read.  I think it makes sense to follow-up with podiatry.  He agrees.

## 2023-06-18 NOTE — Assessment & Plan Note (Signed)
A1c 7.1.  d/w pt at OV.  Improved from prior.   He adjusted his insulin lower.  Down to 11 units.   Recheck periodically.  Continue Jardiance insulin metformin.

## 2023-06-19 ENCOUNTER — Ambulatory Visit: Payer: 59 | Admitting: Podiatry

## 2023-06-19 ENCOUNTER — Encounter: Payer: Self-pay | Admitting: Cardiology

## 2023-06-20 NOTE — Telephone Encounter (Signed)
Should be OK - if for short term can take without Brilinta until done.   DH

## 2023-07-10 ENCOUNTER — Other Ambulatory Visit: Payer: Self-pay | Admitting: Family Medicine

## 2023-07-10 LAB — HM DIABETES EYE EXAM

## 2023-07-16 ENCOUNTER — Ambulatory Visit: Payer: 59 | Admitting: Podiatry

## 2023-07-30 ENCOUNTER — Other Ambulatory Visit: Payer: Self-pay | Admitting: Family Medicine

## 2023-07-30 DIAGNOSIS — E1149 Type 2 diabetes mellitus with other diabetic neurological complication: Secondary | ICD-10-CM

## 2023-09-18 ENCOUNTER — Ambulatory Visit: Payer: 59 | Admitting: Family Medicine

## 2023-09-18 ENCOUNTER — Encounter: Payer: Self-pay | Admitting: Family Medicine

## 2023-09-18 VITALS — BP 122/72 | HR 81 | Temp 98.5°F | Ht 70.0 in | Wt 190.0 lb

## 2023-09-18 DIAGNOSIS — E1149 Type 2 diabetes mellitus with other diabetic neurological complication: Secondary | ICD-10-CM | POA: Diagnosis not present

## 2023-09-18 DIAGNOSIS — S60450A Superficial foreign body of right index finger, initial encounter: Secondary | ICD-10-CM | POA: Diagnosis not present

## 2023-09-18 DIAGNOSIS — Z23 Encounter for immunization: Secondary | ICD-10-CM | POA: Diagnosis not present

## 2023-09-18 DIAGNOSIS — Z7984 Long term (current) use of oral hypoglycemic drugs: Secondary | ICD-10-CM

## 2023-09-18 DIAGNOSIS — Z7985 Long-term (current) use of injectable non-insulin antidiabetic drugs: Secondary | ICD-10-CM

## 2023-09-18 DIAGNOSIS — T148XXA Other injury of unspecified body region, initial encounter: Secondary | ICD-10-CM | POA: Insufficient documentation

## 2023-09-18 LAB — POCT GLYCOSYLATED HEMOGLOBIN (HGB A1C): Hemoglobin A1C: 7.9 % — AB (ref 4.0–5.6)

## 2023-09-18 MED ORDER — LANTUS SOLOSTAR 100 UNIT/ML ~~LOC~~ SOPN: 9.0000 [IU] | PEN_INJECTOR | Freq: Every day | SUBCUTANEOUS | Status: DC

## 2023-09-18 NOTE — Patient Instructions (Addendum)
Keep your finger clean and update me as needed.  Add on protein midmorning and see if that helps.   If you continue to have sugar readings above 150, then add 1 unit per day as needed.   Gently try to file down the callus and let me know if that isn't getting better.   Recheck in about 4 months, A1c at the visit.

## 2023-09-18 NOTE — Progress Notes (Signed)
Diabetes:  Using medications without difficulties: yes Hypoglycemic episodes: he can have sx midmorning corrected with a snack.  D/w pt about inc protein in the early AM or midmorning.   Hyperglycemic episodes: no Feet problems: lesion R 1st toe- blister vs callous.   Blood Sugars averaging: ~150.   eye exam within last year: yes He had lower insulin requirement but then inc dose need over the last few weeks.  Up to 9 units.   Weight is up a few lbs.    Possible splinter R 2nd finger. He thought he got it out.  Less red and sore now.  R2nd PIP.  Normal ROM  His dad is increasingly withdrawn and debilitated.  Condolences offered.  Meds, vitals, and allergies reviewed.   ROS: Per HPI unless specifically indicated in ROS section   GEN: nad, alert and oriented HEENT: ncat NECK: supple w/o LA CV: rrr. PULM: ctab, no inc wob ABD: soft, +bs EXT: no edema SKIN: He has some local mild blanching redness at the right second PIP, on the thumb side.  He has normal range of motion.  No foreign body seen with transillumination or upon magnification.  No fluctuant mass.  Diabetic foot exam: Normal inspection No skin breakdown Distal callus R 1st toe w/o ulceration.   Normal DP pulses Normal sensation to light touch and monofilament Nails normal

## 2023-09-18 NOTE — Assessment & Plan Note (Signed)
He has a pumice stone or similar that he can use to gently file down the callus on his toe.  Routine cautions given.  He had lower insulin requirement but then inc dose need over the last few weeks.  Up to 9 units.   Weight is up a few lbs.   Discussed adding on protein snack midmorning to decrease hypoglycemia risk and hopefully improve overall control.  If he is having sugar elevations above 150 can add 1 unit of insulin per day.  Recheck in spring 2025 and update me as needed in the meantime.  He agrees to plan.

## 2023-09-18 NOTE — Assessment & Plan Note (Signed)
The area feels better today.  I do not see a foreign body any thinks he may have gotten it out.  Since it feels better I think it makes sense to observe for now and keep the area clean in the meantime.  He can update me as needed.

## 2023-10-20 ENCOUNTER — Encounter: Payer: Self-pay | Admitting: Gastroenterology

## 2023-11-10 ENCOUNTER — Other Ambulatory Visit: Payer: Self-pay | Admitting: Family Medicine

## 2023-11-10 ENCOUNTER — Encounter: Payer: Self-pay | Admitting: Family Medicine

## 2023-11-10 ENCOUNTER — Other Ambulatory Visit: Payer: Self-pay | Admitting: Cardiology

## 2023-11-10 NOTE — Telephone Encounter (Signed)
Last office visit:09/18/23 Next office visit: nothing scheduled Last refill: metFORMIN (GLUCOPHAGE) 500 MG tablet 06/05/22

## 2023-11-11 NOTE — Telephone Encounter (Signed)
Patient called in to follow up on this refill. He stated that he is completely out of his medication. Thank you!

## 2023-11-11 NOTE — Telephone Encounter (Signed)
Spoke with and advised that rx was sent to [facility].

## 2023-11-12 ENCOUNTER — Encounter: Payer: Self-pay | Admitting: Family Medicine

## 2023-11-12 ENCOUNTER — Other Ambulatory Visit: Payer: Self-pay | Admitting: Family Medicine

## 2023-11-12 MED ORDER — METFORMIN HCL 500 MG PO TABS: 500.0000 mg | ORAL_TABLET | Freq: Two times a day (BID) | ORAL | 3 refills | Status: DC

## 2023-11-13 ENCOUNTER — Other Ambulatory Visit: Payer: Self-pay

## 2023-11-13 MED ORDER — METFORMIN HCL 500 MG PO TABS: 500.0000 mg | ORAL_TABLET | Freq: Two times a day (BID) | ORAL | 0 refills | Status: DC

## 2023-11-13 NOTE — Telephone Encounter (Signed)
Patient called in and stated that he has been without his metformin for 2-3 weeks. He stated that he had got his meds mixed up. He stated that the refill from [facility] will take about a few weeks to get here. He was wanting to know if some could be sent over to the local pharmacy [facility] - [place], [place] - [address] until his refill come in. He stated that his blood sugar this morning was 302 and he thinks he need the medication soon. He stated he would like a call if this can or can not be done. Thank you!

## 2023-11-13 NOTE — Telephone Encounter (Signed)
Called patient left message advising that a 2 week supply of the metformin has been sent to [facility].

## 2024-01-04 ENCOUNTER — Other Ambulatory Visit: Payer: Self-pay | Admitting: Cardiology

## 2024-01-14 ENCOUNTER — Ambulatory Visit: Payer: Self-pay | Admitting: Family Medicine

## 2024-01-14 NOTE — Telephone Encounter (Signed)
I am not in the office today.  I see that he is scheduled for tomorrow.  Thanks.

## 2024-01-14 NOTE — Telephone Encounter (Signed)
  Chief Complaint: blood in urine Symptoms: blood in urine, clots Frequency: began last night, x2 episodes Pertinent Negatives: Patient denies abdominal pain/flank pain, pain with urination Disposition: [] ED /[] Urgent Care (no appt availability in office) / [x] Appointment(In office/virtual)/ []  [facility]/ [] Home Care/ [x] Refused Recommended Disposition /[] [facility]/ []  Follow-up with PCP Additional Notes: Patient called reporting two episodes of blood in urine since last night. Patient denies pain, but reports he is on a blood thinner. Per protocol, patient to be evaluated within 4 hours. First available appointment with PCP is 01/16/24. Availability with other providers in clinic within protocol guidelines, patient refused to see alternative provider. Care advice reviewed, patient requesting call back from office to see if he can be added to the schedule with PCP today. Alerting PCP for review and f/u.    Copied from CRM [id number]. Topic: Clinical - Red Word Triage >> Jan 14, 2024  8:26 AM Hector Shade B wrote: [facility] that prompted transfer to Nurse Triage: urinating blood all last night, and extremely discolored, and no pain associated with it. Reason for Disposition  Taking Coumadin (warfarin) or other strong blood thinner, or known bleeding disorder (e.g., thrombocytopenia)  Answer Assessment - Initial Assessment Questions 1. COLOR of URINE: "Describe the color of the urine."  (e.g., tea-colored, pink, red, bloody) "Do you have blood clots in your urine?" (e.g., none, pea, grape, small coin)     Clots pea size, bloody looking, bright red. 2. ONSET: "When did the bleeding start?"      Last night around 2030. 3. EPISODES: "How many times has there been blood in the urine?" or "How many times today?"     Two episodes, no blood in the urine this morning. 4. PAIN with URINATION: "Is there any pain with passing your urine?" If Yes, ask: "How bad is the pain?"  (Scale 1-10;  or mild, moderate, severe)    - MILD: Complains slightly about urination hurting.    - MODERATE: Interferes with normal activities.      - SEVERE: Excruciating, unwilling or unable to urinate because of the pain.      Denies 5. FEVER: "Do you have a fever?" If Yes, ask: "What is your temperature, how was it measured, and when did it start?"     Denies 6. ASSOCIATED SYMPTOMS: "Are you passing urine more frequently than usual?"     Reports normal frequency 7. OTHER SYMPTOMS: "Do you have any other symptoms?" (e.g., back/flank pain, abdomen pain, vomiting)     Denies  Protocols used: Urine - Blood In-A-AH

## 2024-01-14 NOTE — Telephone Encounter (Signed)
[name] [name]  [facility]3 minutes ago (8:40 AM)   AU [name]-- refused to see alternate provider, requesting call back, see triage note.    Patient scheduled appt with Dr. Para March on 01/20/24

## 2024-01-15 ENCOUNTER — Encounter: Payer: Self-pay | Admitting: Internal Medicine

## 2024-01-15 ENCOUNTER — Ambulatory Visit (INDEPENDENT_AMBULATORY_CARE_PROVIDER_SITE_OTHER): Payer: 59 | Admitting: Internal Medicine

## 2024-01-15 VITALS — BP 102/70 | HR 72 | Temp 98.0°F | Ht 70.0 in | Wt 193.0 lb

## 2024-01-15 DIAGNOSIS — R319 Hematuria, unspecified: Secondary | ICD-10-CM | POA: Diagnosis not present

## 2024-01-15 DIAGNOSIS — R310 Gross hematuria: Secondary | ICD-10-CM | POA: Diagnosis not present

## 2024-01-15 LAB — POC URINALSYSI DIPSTICK (AUTOMATED)
Bilirubin, UA: NEGATIVE
Blood, UA: NEGATIVE
Glucose, UA: POSITIVE — AB
Ketones, UA: NEGATIVE
Leukocytes, UA: NEGATIVE
Nitrite, UA: NEGATIVE
Protein, UA: NEGATIVE
Spec Grav, UA: 1.015 (ref 1.010–1.025)
Urobilinogen, UA: 0.2 U/dL
pH, UA: 6.5 (ref 5.0–8.0)

## 2024-01-15 NOTE — Addendum Note (Signed)
Addended by: Alvina Chou on: 01/15/2024 10:38 AM   Modules accepted: Orders

## 2024-01-15 NOTE — Progress Notes (Signed)
Subjective:    Patient ID: [name] [name], male    DOB: Sep 16, 1961, 63 y.o.   MRN: [id number]  HPI Here due to blood in his urine  Saw blood in toilet bowl 2 nights ago--with urine Saw clots and red in the whole urine stream Next time a few hours later---cloudy  Normal in AM and for the rest of yesterday  No pain No fever No back pain No history of kidney stones Has been on brilinta for years  Current Outpatient Medications on File Prior to Visit  Medication Sig Dispense Refill   B-D UF III MINI PEN NEEDLES 31G X 5 MM MISC USE AS DIRECTED WITH INSULIN PEN 100 each 3   BRILINTA 60 MG TABS tablet TAKE 1 TABLET BY MOUTH TWICE  DAILY 180 tablet 3   fenofibrate 160 MG tablet TAKE 1 TABLET BY MOUTH DAILY 90 tablet 3   folic acid (FOLVITE) 400 MCG tablet TAKE 1 TABLET BY MOUTH DAILY 90 tablet 3   insulin glargine (LANTUS SOLOSTAR) 100 UNIT/ML Solostar Pen Inject 9 Units into the skin daily.     JARDIANCE 10 MG TABS tablet TAKE 1 TABLET BY MOUTH DAILY  BEFORE BREAKFAST 90 tablet 3   lisinopril (ZESTRIL) 2.5 MG tablet TAKE 1 TABLET BY MOUTH DAILY 90 tablet 3   metFORMIN (GLUCOPHAGE) 500 MG tablet Take 1 tablet (500 mg total) by mouth 2 (two) times daily with a meal. 28 tablet 0   metoprolol tartrate (LOPRESSOR) 25 MG tablet TAKE 1 TABLET BY MOUTH TWICE  DAILY 180 tablet 3   nitroGLYCERIN (NITROSTAT) 0.4 MG SL tablet DISSOLVE 1 TABLET UNDER THE TONGUE EVERY 5 MINUTES AS  NEEDED FOR CHEST PAIN. MAX  OF 3 TABLETS IN 15 MINUTES. CALL 911 IF PAIN PERSISTS. 100 tablet 3   omega-3 acid ethyl esters (LOVAZA) 1 g capsule Take 2 capsules (2 g total) by mouth 2 (two) times daily. 360 capsule 3   ONETOUCH DELICA LANCETS 33G MISC Use as instructed to check blood sugar once daily or as needed.  Diagnosis:  E11.49  Non insulin dependent. 100 each 3   ONETOUCH VERIO test strip TEST ONCE DAILY AND AS NEEDED 50 strip 11   rosuvastatin (CRESTOR) 40 MG tablet TAKE 1 TABLET BY MOUTH DAILY 90 tablet 3    vitamin B-12 (CYANOCOBALAMIN) 1000 MCG tablet Take 1 tablet (1,000 mcg total) by mouth daily.     vitamin B-6 (PYRIDOXINE) 25 MG tablet Take 1 tablet (25 mg total) by mouth daily.     No current facility-administered medications on file prior to visit.    Allergies  Allergen Reactions   Metformin And Related     Intolerant of more than 1000mg  a day    Past Medical History:  Diagnosis Date   Allergic rhinitis    CAD S/P percutaneous coronary angioplasty 12/03/2013   PCI-mRCA (100%-0%) => Xience Alpine DES 3.0 mm x 18 mm --> 3.5 mm; Echo 1/'15" EF 45-50%, mild C LVH, mild Inf HK, Gr 1 DD   Diabetes mellitus    type2   Fracture of right hand 1983   Hyperlipidemia    Hypertension    p t denies   Nocturia    Paresthesia    tingling in feet   STEMI (ST elevation myocardial infarction) of inf. wall 12/03/2013   p-mRCA 100% - DES PCI    Past Surgical History:  Procedure Laterality Date   KNEE SURGERY     B arthroscopic  LEFT HEART CATH AND CORONARY ANGIOGRAPHY N/A 07/16/2018   Procedure: LEFT HEART CATH AND CORONARY ANGIOGRAPHY;  Surgeon: Lyn Records, MD;  Location: [facility];  Widely patent stent in dominant RCA.  Tortuous/widely patent wraparound LAD.  Normal/tortuous LCx.  EF 55%.  Inferior HK.   LEFT HEART CATHETERIZATION WITH CORONARY ANGIOGRAM N/A 12/03/2013   Procedure: LEFT HEART CATHETERIZATION WITH CORONARY ANGIOGRAM;  Surgeon: Marykay Lex, MD;  Location: [facility] CATH LAB;; Large- Dom RCA - m100% (DES PCI) -<RPCA & RPAV-PLx3; Large-Long LM-< LAD, RI x 2 & LCx - mild Prox LAD, otw normal LCA System.   NM MYOVIEW LTD  06/2018   a) 11/2015: Normal; b) 7/20198: EF 51%.  Findings consistent with prior MI with peri-infarct ischemia (LOW RISK).  Small defect of mild severity in the basal inferior mid inferior location.  There is also horizontal ST segment depression in inferior leads.  Although low risk images, the EKG changes are concerning.  There is concern for  possible multivessel CAD.   PERCUTANEOUS CORONARY STENT INTERVENTION (PCI-S)  12/03/2013   mRCA - Xience Alpine DES 3.0 mm x 18 mm --> 3.5 mm   SEPTOPLASTY  1970   due to Fx Nose   TRANSTHORACIC ECHOCARDIOGRAM  11/2014   a) 12/2013 (post Inf STEMI). Mild Conc LVH; EF 45-50%, Mild Inferior HK. Grade 1 DD;; b) 11/2014. EF 55-60%. No RWMA. Moderate RV dilation but normal function.    Family History  Problem Relation Age of Onset   Cancer Mother        ovarian   Hypertension Father    Hyperlipidemia Father    Cancer Father        bladder- Kidney cancer   Diabetes Father    Hearing loss Brother    Heart disease Paternal Grandfather        MI   Prostate cancer Neg Hx    Esophageal cancer Neg Hx    Inflammatory bowel disease Neg Hx    Liver disease Neg Hx    Pancreatic cancer Neg Hx    Stomach cancer Neg Hx    Colon cancer Neg Hx     Social History   Socioeconomic History   Marital status: Married    Spouse name: Not on file   Number of children: 1   Years of education: Not on file   Highest education level: Some college, no degree  Occupational History   Occupation: Airline pilot: [employer]    Comment: [employer] practitioner  Tobacco Use   Smoking status: Former    Current packs/day: 0.00    Average packs/day: 0.5 packs/day for 20.0 years (10.0 ttl pk-yrs)    Types: Cigarettes    Start date: 12/03/1993    Quit date: 12/03/2013    Years since quitting: 10.1   Smokeless tobacco: Never  Vaping Use   Vaping status: Never Used  Substance and Sexual Activity   Alcohol use: Yes    Comment: occ on weekends   Drug use: No   Sexual activity: Not on file  Other Topics Concern   Not on file  Social History Narrative   Happily married 1988 and lives with wife   One son   Works at Editor, commissioning, Merchandiser, retail.   Forward 10 pack year smoker, who quit on the day of his MI   Social Drivers of Health   Financial Resource Strain: Low  Risk  (03/17/2023)   Overall Financial  Resource Strain (CARDIA)    Difficulty of Paying Living Expenses: Not hard at all  Food Insecurity: No Food Insecurity (03/17/2023)   Hunger Vital Sign    Worried About Running Out of Food in the Last Year: Never true    Ran Out of Food in the Last Year: Never true  Transportation Needs: No Transportation Needs (03/17/2023)   PRAPARE - Administrator, Civil Service (Medical): No    Lack of Transportation (Non-Medical): No  Physical Activity: Insufficiently Active (03/17/2023)   Exercise Vital Sign    Days of Exercise per Week: 3 days    Minutes of Exercise per Session: 30 min  Stress: No Stress Concern Present (03/17/2023)   [employer] of Occupational Health - Occupational Stress Questionnaire    Feeling of Stress : Not at all  Social Connections: Unknown (03/17/2023)   Social Connection and Isolation Panel [NHANES]    Frequency of Communication with Friends and Family: More than three times a week    Frequency of Social Gatherings with Friends and Family: More than three times a week    Attends Religious Services: Patient declined    Database administrator or Organizations: No    Attends Engineer, structural: Not on file    Marital Status: Married  Catering manager Violence: Not on file   Review of Systems Appetite is okay No N/V No cough or SOB Some trouble with erections lately--monogamous with wife     Objective:   Physical Exam Constitutional:      Appearance: Normal appearance.  Pulmonary:     Effort: Pulmonary effort is normal.     Breath sounds: Normal breath sounds. No wheezing or rales.  Abdominal:     General: Bowel sounds are normal. There is no distension.     Palpations: Abdomen is soft.     Tenderness: There is no abdominal tenderness. There is no guarding or rebound.  Neurological:     Mental Status: He is alert.            Assessment & Plan:

## 2024-01-15 NOTE — Addendum Note (Signed)
Addended by: Alvina Chou on: 01/15/2024 03:43 PM   Modules accepted: Orders

## 2024-01-15 NOTE — Assessment & Plan Note (Signed)
No history of stones and no pain Concerning for neoplasm Will check labs and PSA CT scan with contrast--will set up with urology as well

## 2024-01-16 ENCOUNTER — Encounter: Payer: Self-pay | Admitting: Internal Medicine

## 2024-01-16 LAB — CBC
HCT: 42.1 % (ref 38.5–50.0)
Hemoglobin: 14.3 g/dL (ref 13.2–17.1)
MCH: 29.4 pg (ref 27.0–33.0)
MCHC: 34 g/dL (ref 32.0–36.0)
MCV: 86.4 fL (ref 80.0–100.0)
MPV: 11.7 fL (ref 7.5–12.5)
Platelets: 209 10*3/uL (ref 140–400)
RBC: 4.87 10*6/uL (ref 4.20–5.80)
RDW: 12.6 % (ref 11.0–15.0)
WBC: 6.7 10*3/uL (ref 3.8–10.8)

## 2024-01-16 LAB — COMPREHENSIVE METABOLIC PANEL
AG Ratio: 1.8 (calc) (ref 1.0–2.5)
ALT: 23 U/L (ref 9–46)
AST: 20 U/L (ref 10–35)
Albumin: 4.4 g/dL (ref 3.6–5.1)
Alkaline phosphatase (APISO): 40 U/L (ref 35–144)
BUN: 17 mg/dL (ref 7–25)
CO2: 16 mmol/L — ABNORMAL LOW (ref 20–32)
Calcium: 9.8 mg/dL (ref 8.6–10.3)
Chloride: 104 mmol/L (ref 98–110)
Creat: 0.79 mg/dL (ref 0.70–1.35)
Globulin: 2.5 g/dL (ref 1.9–3.7)
Glucose, Bld: 202 mg/dL — ABNORMAL HIGH (ref 65–99)
Potassium: 4.5 mmol/L (ref 3.5–5.3)
Sodium: 139 mmol/L (ref 135–146)
Total Bilirubin: 0.6 mg/dL (ref 0.2–1.2)
Total Protein: 6.9 g/dL (ref 6.1–8.1)

## 2024-01-16 LAB — PSA: PSA: 0.34 ng/mL (ref <=4.00)

## 2024-01-20 ENCOUNTER — Encounter: Payer: Self-pay | Admitting: Family Medicine

## 2024-01-20 ENCOUNTER — Ambulatory Visit (INDEPENDENT_AMBULATORY_CARE_PROVIDER_SITE_OTHER): Payer: 59 | Admitting: Family Medicine

## 2024-01-20 ENCOUNTER — Other Ambulatory Visit: Payer: Self-pay | Admitting: Family Medicine

## 2024-01-20 VITALS — BP 128/62 | HR 72 | Temp 98.1°F | Ht 70.0 in | Wt 193.4 lb

## 2024-01-20 DIAGNOSIS — Z7984 Long term (current) use of oral hypoglycemic drugs: Secondary | ICD-10-CM | POA: Diagnosis not present

## 2024-01-20 DIAGNOSIS — R310 Gross hematuria: Secondary | ICD-10-CM

## 2024-01-20 DIAGNOSIS — E1149 Type 2 diabetes mellitus with other diabetic neurological complication: Secondary | ICD-10-CM | POA: Diagnosis not present

## 2024-01-20 LAB — POCT GLYCOSYLATED HEMOGLOBIN (HGB A1C): Hemoglobin A1C: 7.5 % — AB (ref 4.0–5.6)

## 2024-01-20 MED ORDER — LANTUS SOLOSTAR 100 UNIT/ML ~~LOC~~ SOPN: 20.0000 [IU] | PEN_INJECTOR | Freq: Every day | SUBCUTANEOUS | Status: DC

## 2024-01-20 NOTE — Patient Instructions (Addendum)
Imaging site: [facility] at [facility] [address] [facility] [address]  Recheck at yearly visit in about 3-4 months.  Labs ahead of time if possible.   Take care.  Glad to see you.

## 2024-01-20 NOTE — Progress Notes (Unsigned)
He had hematuria episodically recently, no pain at the time.  Sx resolved in the meantime.  D/w pt about hematuria w/u.  On brilinta.  Has urology referral and imaging pending.   Diabetes:  Using medications without difficulties: yes Hypoglycemic episodes: no Hyperglycemic episodes: no Feet problems: at baseline.  Not worse.   Blood Sugars averaging: ~140.   eye exam within last year: yes A1c d/w pt at OV.   7.5.  His father passed, condolences offered.    Scraped R abd with a fall at home.  Cautions d/w pt.  Mechanical fall with tripping.  No syncope.   PMH and SH reviewed  Meds, vitals, and allergies reviewed.   ROS: Per HPI unless specifically indicated in ROS section   GEN: nad, alert and oriented HEENT: ncat NECK: supple w/o LA CV: rrr. PULM: ctab, no inc wob ABD: soft, +bs EXT: no edema SKIN: healing abrasion R arm  30 minutes were devoted to patient care in this encounter (this includes time spent reviewing the patient's file/history, interviewing and examining the patient, counseling/reviewing plan with patient).

## 2024-01-21 NOTE — Assessment & Plan Note (Signed)
D/w pt about hematuria w/u.  On brilinta.  Has urology referral and imaging pending.

## 2024-01-21 NOTE — Assessment & Plan Note (Signed)
No change in meds.  A1c reasonable given recent events.   Recheck at yearly visit in about 3-4 months.  Labs ahead of time if possible.

## 2024-01-30 ENCOUNTER — Ambulatory Visit
Admission: RE | Admit: 2024-01-30 | Discharge: 2024-01-30 | Disposition: A | Discharge status: Home / Self Care | Payer: 59 | Source: Ambulatory Visit | Attending: Internal Medicine | Admitting: Internal Medicine

## 2024-01-30 DIAGNOSIS — R310 Gross hematuria: Secondary | ICD-10-CM | POA: Diagnosis present

## 2024-01-30 MED ORDER — IOHEXOL 350 MG/ML SOLN: 125.0000 mL | Freq: Once | INTRAVENOUS | Status: DC | PRN

## 2024-01-30 MED ORDER — IOHEXOL 300 MG/ML  SOLN
125.0000 mL | Freq: Once | INTRAMUSCULAR | Status: AC | PRN
  Administered 2024-01-30: 125 mL via INTRAVENOUS

## 2024-01-30 MED ORDER — SODIUM CHLORIDE 0.9 % IV SOLN: INTRAVENOUS | Status: DC

## 2024-02-05 ENCOUNTER — Encounter: Payer: Self-pay | Admitting: Internal Medicine

## 2024-02-12 ENCOUNTER — Ambulatory Visit (INDEPENDENT_AMBULATORY_CARE_PROVIDER_SITE_OTHER): Payer: 59 | Admitting: Urology

## 2024-02-12 VITALS — BP 130/75 | HR 57 | Ht 70.0 in | Wt 190.4 lb

## 2024-02-12 DIAGNOSIS — N200 Calculus of kidney: Secondary | ICD-10-CM | POA: Diagnosis not present

## 2024-02-12 DIAGNOSIS — N400 Enlarged prostate without lower urinary tract symptoms: Secondary | ICD-10-CM

## 2024-02-12 DIAGNOSIS — R310 Gross hematuria: Secondary | ICD-10-CM

## 2024-02-12 LAB — URINALYSIS, COMPLETE
Bilirubin, UA: NEGATIVE
Ketones, UA: NEGATIVE
Leukocytes,UA: NEGATIVE
Nitrite, UA: NEGATIVE
Protein,UA: NEGATIVE
RBC, UA: NEGATIVE
Specific Gravity, UA: 1.015 (ref 1.005–1.030)
Urobilinogen, Ur: 2 mg/dL — ABNORMAL HIGH (ref 0.2–1.0)
pH, UA: 7 (ref 5.0–7.5)

## 2024-02-12 LAB — MICROSCOPIC EXAMINATION: Bacteria, UA: NONE SEEN

## 2024-02-12 MED ORDER — DIAZEPAM 10 MG PO TABS: 10.0000 mg | ORAL_TABLET | Freq: Once | ORAL | 0 refills | Status: AC

## 2024-02-12 NOTE — Progress Notes (Signed)
 [name] [name],acting as a scribe for [name] Scotland, MD.,have documented all relevant documentation on the behalf of [name] Scotland, MD,as directed by  [name] Scotland, MD while in the presence of [name] Scotland, MD.  02/12/24 12:03 PM   [name] [name] 02-Sep-1961 [id number]  Referring provider: Karie Schwalbe, MD [address] [place],  [place] [address]  Chief Complaint  Patient presents with   Establish Care   Hematuria    HPI: 63 y/o male with a personal history of hematuria. He is on Brilinta. In mid-February, he experienced an episode of gross hematuria with clots, which resolved spontaneously by the next morning. He denies any history of kidney stones. He gave a UA at the time of evaluation, which had no evidence of microscopic blood.   His microscopic urine today shows no blood.   His most recent PSA on 01/15/2024 was 0.34.   A CT scan performed on 02-04-2024 and personally reviewed by me showed small bilateral non-obstructing calculi and some prostatic enlargement, but was otherwise unremarkable.   He has no urinary pain, straining, or constipation. He reports nocturia twice nightly, attributed to medication.   He has a history of smoking, approximately two and a half packs per week for ten years, quitting after a heart attack in 2015. Family history includes a father with bladder cancer diagnosed in his late 78 and a mother with ovarian cancer.    PMH: Past Medical History:  Diagnosis Date   Allergic rhinitis    CAD S/P percutaneous coronary angioplasty 12/03/2013   PCI-mRCA (100%-0%) => Xience Alpine DES 3.0 mm x 18 mm --> 3.5 mm; Echo 1/'15" EF 45-50%, mild C LVH, mild Inf HK, Gr 1 DD   Diabetes mellitus    type2   Fracture of right hand 1983   Hyperlipidemia    Hypertension    p t denies   Nocturia    Paresthesia    tingling in feet   STEMI (ST elevation myocardial infarction) of inf. wall 12/03/2013   p-mRCA 100% - DES PCI     Surgical History: Past Surgical History:  Procedure Laterality Date   KNEE SURGERY     B arthroscopic   LEFT HEART CATH AND CORONARY ANGIOGRAPHY N/A 07/16/2018   Procedure: LEFT HEART CATH AND CORONARY ANGIOGRAPHY;  Surgeon: Lyn Records, MD;  Location: [facility];  Widely patent stent in dominant RCA.  Tortuous/widely patent wraparound LAD.  Normal/tortuous LCx.  EF 55%.  Inferior HK.   LEFT HEART CATHETERIZATION WITH CORONARY ANGIOGRAM N/A 12/03/2013   Procedure: LEFT HEART CATHETERIZATION WITH CORONARY ANGIOGRAM;  Surgeon: Marykay Lex, MD;  Location: [facility] CATH LAB;; Large- Dom RCA - m100% (DES PCI) -<RPCA & RPAV-PLx3; Large-Long LM-< LAD, RI x 2 & LCx - mild Prox LAD, otw normal LCA System.   NM MYOVIEW LTD  06/2018   a) 11/2015: Normal; b) 7/20198: EF 51%.  Findings consistent with prior MI with peri-infarct ischemia (LOW RISK).  Small defect of mild severity in the basal inferior mid inferior location.  There is also horizontal ST segment depression in inferior leads.  Although low risk images, the EKG changes are concerning.  There is concern for possible multivessel CAD.   PERCUTANEOUS CORONARY STENT INTERVENTION (PCI-S)  12/03/2013   mRCA - Xience Alpine DES 3.0 mm x 18 mm --> 3.5 mm   SEPTOPLASTY  1970   due to Fx Nose   TRANSTHORACIC ECHOCARDIOGRAM  11/2014   a)  12/2013 (post Inf STEMI). Mild Conc LVH; EF 45-50%, Mild Inferior HK. Grade 1 DD;; b) 11/2014. EF 55-60%. No RWMA. Moderate RV dilation but normal function.    Home Medications:  Allergies as of 02/12/2024       Reactions   Metformin And Related    Intolerant of more than 1000mg  a day        Medication List        Accurate as of February 12, 2024 12:03 PM. If you have any questions, ask your nurse or doctor.          B-D UF [name] MINI PEN NEEDLES 31G X 5 MM Misc Generic drug: Insulin Pen Needle USE AS DIRECTED WITH INSULIN PEN   Brilinta 60 MG Tabs tablet Generic drug: ticagrelor TAKE 1 TABLET  BY MOUTH TWICE  DAILY   cyanocobalamin 1000 MCG tablet Commonly known as: VITAMIN B12 Take 1 tablet (1,000 mcg total) by mouth daily.   diazepam 10 MG tablet Commonly known as: Valium Take 1 tablet (10 mg total) by mouth once for 1 dose. Take 1 hour prior to procedure. Please have a driver available.   fenofibrate 160 MG tablet TAKE 1 TABLET BY MOUTH DAILY   folic acid 400 MCG tablet Commonly known as: FOLVITE TAKE 1 TABLET BY MOUTH DAILY   Jardiance 10 MG Tabs tablet Generic drug: empagliflozin TAKE 1 TABLET BY MOUTH DAILY  BEFORE BREAKFAST   Lantus SoloStar 100 UNIT/ML Solostar Pen Generic drug: insulin glargine Inject 20 Units into the skin daily.   lisinopril 2.5 MG tablet Commonly known as: ZESTRIL TAKE 1 TABLET BY MOUTH DAILY   metFORMIN 500 MG tablet Commonly known as: GLUCOPHAGE Take 1 tablet (500 mg total) by mouth 2 (two) times daily with a meal.   metoprolol tartrate 25 MG tablet Commonly known as: LOPRESSOR TAKE 1 TABLET BY MOUTH TWICE  DAILY   nitroGLYCERIN 0.4 MG SL tablet Commonly known as: NITROSTAT DISSOLVE 1 TABLET UNDER THE TONGUE EVERY 5 MINUTES AS  NEEDED FOR CHEST PAIN. MAX  OF 3 TABLETS IN 15 MINUTES. CALL 911 IF PAIN PERSISTS.   omega-3 acid ethyl esters 1 g capsule Commonly known as: Lovaza Take 2 capsules (2 g total) by mouth 2 (two) times daily.   OneTouch Delica Lancets 33G Misc Use as instructed to check blood sugar once daily or as needed.  Diagnosis:  E11.49  Non insulin dependent.   OneTouch Verio test strip Generic drug: glucose blood TEST ONCE DAILY AND AS NEEDED   pyridOXINE 25 MG tablet Commonly known as: VITAMIN B6 Take 1 tablet (25 mg total) by mouth daily.   rosuvastatin 40 MG tablet Commonly known as: CRESTOR TAKE 1 TABLET BY MOUTH DAILY        Allergies:  Allergies  Allergen Reactions   Metformin And Related     Intolerant of more than 1000mg  a day    Family History: Family History  Problem Relation Age  of Onset   Cancer Mother        ovarian   Hypertension Father    Hyperlipidemia Father    Cancer Father        bladder- Kidney cancer   Diabetes Father    Hearing loss Brother    Heart disease Paternal Grandfather        MI   Prostate cancer Neg Hx    Esophageal cancer Neg Hx    Inflammatory bowel disease Neg Hx    Liver disease Neg Hx  Pancreatic cancer Neg Hx    Stomach cancer Neg Hx    Colon cancer Neg Hx     Social History:  reports that he quit smoking about 10 years ago. His smoking use included cigarettes. He started smoking about 30 years ago. He has a 10 pack-year smoking history. He has never used smokeless tobacco. He reports current alcohol use. He reports that he does not use drugs.   Physical Exam: BP 130/75   Pulse (!) 57   Ht 5\' 10"  (1.778 m)   Wt 190 lb 6 oz (86.4 kg)   BMI 27.32 kg/m   Constitutional:  Alert and oriented, No acute distress. HEENT: [place] AT, moist mucus membranes.  Trachea midline, no masses. Neurologic: Grossly intact, no focal deficits, moving all 4 extremities. Psychiatric: Normal mood and affect.   Pertinent Imaging: EXAM: CT ABDOMEN AND PELVIS WITHOUT AND WITH CONTRAST  TECHNIQUE: Multidetector CT imaging of the abdomen and pelvis was performed following the standard protocol before and following the bolus administration of intravenous contrast.  RADIATION DOSE REDUCTION: This exam was performed according to the departmental dose-optimization program which includes automated exposure control, adjustment of the mA and/or kV according to patient size and/or use of iterative reconstruction technique.  CONTRAST:  OMNIPAQUE IOHEXOL 300 MG/ML  SOLN  COMPARISON:  None Available.  FINDINGS: Lower chest: No acute abnormality.  Coronary artery calcifications.  Hepatobiliary: No solid liver abnormality is seen. Hepatic steatosis. No gallstones, gallbladder wall thickening, or biliary dilatation.  Pancreas: Unremarkable. No  pancreatic ductal dilatation or surrounding inflammatory changes.  Spleen: Normal in size without significant abnormality.  Adrenals/Urinary Tract: Adrenal glands are unremarkable. Small bilateral nonobstructive renal calculi. No ureteral calculi or hydronephrosis. Numerous bilateral parapelvic renal cysts, benign, requiring no further follow-up or characterization. No solid mass or suspicious contrast enhancement. No urinary tract filling defect on delayed phase imaging. Bladder is unremarkable.  Stomach/Bowel: Stomach is within normal limits. Appendix appears normal. No evidence of bowel wall thickening, distention, or inflammatory changes.  Vascular/Lymphatic: Aortic atherosclerosis. No enlarged abdominal or pelvic lymph nodes.  Reproductive: Prostatomegaly.  Other: No abdominal wall hernia or abnormality. No ascites.  Musculoskeletal: No acute or significant osseous findings.  IMPRESSION: 1. Small bilateral nonobstructive renal calculi. No ureteral calculi or hydronephrosis. 2. No solid mass or suspicious contrast enhancement of the kidneys or urinary tract. No urinary tract filling defect on delayed phase imaging. 3. Prostatomegaly. 4. Hepatic steatosis. 5. Coronary artery disease.  Aortic Atherosclerosis (ICD10-I70.0).   Electronically Signed By: Jearld Lesch M.D. On: 02/04/2024 14:19  I personally reviewed this study today in detail.  Agree with radiologic interpretation.  Assessment & Plan:   1. Hematuria - Differential diagnosis includes bladder cancer, BPH, and potential bleeding from the prostate due to [facility]. The CT scan ruled out kidney masses and significant bladder abnormalities. Given the patient's smoking history and family history of bladder cancer, bladder cancer remains a concern. - Schedule cystoscopy to evaluate for bladder cancer. Discussed the procedure, including the use of numbing jelly and the option for Valium if needed for anxiety.  Valium prescription to be sent to the pharmacy.  2. Prostatomegaly - Noted on CT, likely benign given age. No current symptoms of lower urinary tract symptoms reported. - Monitor for any development of LUTS. No immediate intervention required.  3. Renal Calculi - Small bilateral non-obstructing calculi noted on CT.  - Asymptomatic and not causing hematuria. - No treatment necessary unless symptoms develop.  Return for cystoscopy.  I have reviewed the above documentation for accuracy and completeness, and I agree with the above.   [name] Scotland, MD   [facility] [address], [address] [place], [place] [address] [phone]

## 2024-02-12 NOTE — Patient Instructions (Signed)

## 2024-03-31 ENCOUNTER — Other Ambulatory Visit: Payer: Self-pay

## 2024-03-31 ENCOUNTER — Ambulatory Visit (INDEPENDENT_AMBULATORY_CARE_PROVIDER_SITE_OTHER): Admitting: Urology

## 2024-03-31 VITALS — BP 133/79 | HR 62 | Ht 70.0 in | Wt 188.0 lb

## 2024-03-31 DIAGNOSIS — R310 Gross hematuria: Secondary | ICD-10-CM

## 2024-03-31 DIAGNOSIS — N329 Bladder disorder, unspecified: Secondary | ICD-10-CM

## 2024-03-31 DIAGNOSIS — N400 Enlarged prostate without lower urinary tract symptoms: Secondary | ICD-10-CM

## 2024-03-31 DIAGNOSIS — Z01818 Encounter for other preprocedural examination: Secondary | ICD-10-CM

## 2024-03-31 LAB — URINALYSIS, COMPLETE
Bilirubin, UA: NEGATIVE
Leukocytes,UA: NEGATIVE
Nitrite, UA: NEGATIVE
Protein,UA: NEGATIVE
RBC, UA: NEGATIVE
Specific Gravity, UA: 1.015 (ref 1.005–1.030)
Urobilinogen, Ur: 1 mg/dL (ref 0.2–1.0)
pH, UA: 5.5 (ref 5.0–7.5)

## 2024-03-31 LAB — MICROSCOPIC EXAMINATION: Bacteria, UA: NONE SEEN

## 2024-03-31 NOTE — H&P (View-Only) (Signed)
   03/31/24  CC:  Chief Complaint  Patient presents with   Cysto    HPI: 63 year old male with a personal history of hematuria presents today for cystoscopy.  His CT urogram showed small bilateral stones along with prostamegaly but otherwise no upper tract GU pathology.  Blood pressure 133/79, pulse 62, height 5\' 10"  (1.778 m), weight 188 lb (85.3 kg). NED. A&Ox3.   No respiratory distress   Abd soft, NT, ND Normal phallus with bilateral descended testicles  Cystoscopy Procedure Note  Patient identification was confirmed, informed consent was obtained, and patient was prepped using Betadine solution.  Lidocaine  jelly was administered per urethral meatus.     Pre-Procedure: - Inspection reveals a normal caliber ureteral meatus.  Procedure: The flexible cystoscope was introduced without difficulty - No urethral strictures/lesions are present. - Enlarged prostate  - Normal bladder neck - Bilateral ureteral orifices identified - Bladder mucosa  reveals 2 cm area on the left lateral bladder wall which is erythematous and slightly texturized. Concerning for CIS versus cystitis. - No bladder stones - Mild to moderate trabeculation  Retroflexion unremarkable   Post-Procedure: - Patient tolerated the procedure well  Assessment/ Plan:  Bladder erythema/ hematuria - Bladder erythema which is texture rise, highly suspicious for CIS especially in the setting of smoking history -recommend TURBT/bladder biopsy -Will also plan for intravesical gemcitabine - Urine cytology and culture today - Risk of the surgery were discussed in detail today including the risk of bleeding, infection, damage surrounding structures, irritative urinary symptoms amongst others.  All questions were answered. -Will need cardiac clearance to hold Brilinta     Dustin Gimenez, MD

## 2024-03-31 NOTE — Patient Instructions (Signed)
 Transurethral Resection of Bladder Tumor  Transurethral resection of a bladder tumor is the removal (resection) of cancerous tissue (tumor) from the inside wall of the bladder. The bladder is the organ that holds urine. The tumor is removed through the tube that carries urine out of the body (urethra). In a transurethral resection, a thin telescope with a light, a tiny camera, and an electric cutting edge (resectoscope) is passed through the urethra. In men, the opening of the urethra is at the end of the penis. In women, it is just above the opening of the vagina. Tell a health care provider about: Any allergies you have. All medicines you are taking, including vitamins, herbs, eye drops, creams, and over-the-counter medicines. Any problems you or family members have had with anesthetic medicines. Any bleeding problems you have. Any surgeries you have had. Any medical conditions you have, including recent urinary tract infections. Whether you are pregnant or may be pregnant. What are the risks? Generally, this is a safe procedure. However, problems may occur, including: Infection. Bleeding. Allergic reactions to medicines. Damage to nearby structures or organs. Difficulty urinating from blockage of the urethra or not being able to urinate (urinary retention). Deep vein thrombosis. This is a blood clot that can develop in your leg. Recurring cancer. What happens before the procedure? When to stop eating and drinking Follow instructions from your health care provider about what you may eat and drink before your procedure. These may include: 8 hours before your procedure Stop eating most foods. Do not eat meat, fried foods, or fatty foods. Eat only light foods, such as toast or crackers. All liquids are okay except energy drinks and alcohol. 6 hours before your procedure Stop eating. Drink only clear liquids, such as water, clear fruit juice, black coffee, plain tea, and sports  drinks. Do not drink energy drinks or alcohol. 2 hours before your procedure Stop drinking all liquids. You may be allowed to take medicines with small sips of water. Medicines Ask your health care provider about: Changing or stopping your regular medicines. This is especially important if you are taking diabetes medicines or blood thinners. Taking medicines such as aspirin and ibuprofen. These medicines can thin your blood. Do not take these medicines unless your health care provider tells you to take them. Taking over-the-counter medicines, vitamins, herbs, and supplements. General instructions If you will be going home right after the procedure, plan to have a responsible adult: Take you home from the hospital or clinic. You will not be allowed to drive. Care for you for the time you are told. Ask your health care provider what steps will be taken to help prevent infection. These steps may include: Washing skin with a germ-killing soap. Taking antibiotic medicine. Do not use any products that contain nicotine or tobacco for at least 4 weeks before the procedure. These products include cigarettes, chewing tobacco, and vaping devices, such as e-cigarettes. If you need help quitting, ask your health care provider. What happens during the procedure? An IV will be inserted into one of your veins. You will be given one or more of the following: A medicine to help you relax (sedative). A medicine that is injected into your spine to numb the area below and slightly above the injection site (spinal anesthetic). A medicine that is injected into an area of your body to numb everything below the injection site (regional anesthetic). A medicine to make you fall asleep (general anesthetic). Your legs will be placed in foot rests (  stirrups) to open your legs and bend your knees. The resectoscope will be passed through your urethra and into your bladder. The part of your bladder with the tumor will be  resected by the cutting edge of the resectoscope. Fluid will be passed to rinse out the cut tissues (irrigation). The resectoscope will then be taken out. A small, thin tube (catheter) will be passed through your urethra and into your bladder. The catheter will drain urine into a bag outside of your body. The procedure may vary among health care providers and hospitals. What happens after the procedure? Your blood pressure, heart rate, breathing rate, and blood oxygen level will be monitored until you leave the hospital or clinic. You may continue to receive fluids and medicines through an IV. You will be given pain medicine to relieve pain. You will have a catheter to drain your urine. The amount of urine will be measured. If you have blood in your urine, your bladder may be rinsed out by passing fluid through your catheter. You will be encouraged to walk as soon as you can. You may have to wear compression stockings. These stockings help to prevent blood clots and reduce swelling in your legs. If you were given a sedative during the procedure, it can affect you for several hours. Do not drive or operate machinery until your health care provider says that it is safe. Summary Transurethral resection of a bladder tumor is the removal (resection) of a cancerous growth (tumor) on the inside wall of the bladder. To do this procedure, your health care provider uses a thin telescope with a light, a tiny camera, and an electric cutting edge (resectoscope) that is guided to your bladder through your urethra. The part of your bladder that is affected by the tumor will be resected by the cutting edge of the resectoscope. A catheter will be passed through your urethra and into your bladder. The catheter will drain urine into a bag outside of your body. If you will be going home right after the procedure, plan to have a responsible adult take you home from the hospital or clinic. You will not be allowed to  drive. This information is not intended to replace advice given to you by your health care provider. Make sure you discuss any questions you have with your health care provider. Document Revised: 11/23/2021 Document Reviewed: 11/23/2021 Elsevier Patient Education  2024 [employer].

## 2024-03-31 NOTE — Progress Notes (Signed)
   03/31/24  CC:  Chief Complaint  Patient presents with   Cysto    HPI: 63 year old male with a personal history of hematuria presents today for cystoscopy.  His CT urogram showed small bilateral stones along with prostamegaly but otherwise no upper tract GU pathology.  Blood pressure 133/79, pulse 62, height 5\' 10"  (1.778 m), weight 188 lb (85.3 kg). NED. A&Ox3.   No respiratory distress   Abd soft, NT, ND Normal phallus with bilateral descended testicles  Cystoscopy Procedure Note  Patient identification was confirmed, informed consent was obtained, and patient was prepped using Betadine solution.  Lidocaine  jelly was administered per urethral meatus.     Pre-Procedure: - Inspection reveals a normal caliber ureteral meatus.  Procedure: The flexible cystoscope was introduced without difficulty - No urethral strictures/lesions are present. - Enlarged prostate  - Normal bladder neck - Bilateral ureteral orifices identified - Bladder mucosa  reveals 2 cm area on the left lateral bladder wall which is erythematous and slightly texturized. Concerning for CIS versus cystitis. - No bladder stones - Mild to moderate trabeculation  Retroflexion unremarkable   Post-Procedure: - Patient tolerated the procedure well  Assessment/ Plan:  Bladder erythema/ hematuria - Bladder erythema which is texture rise, highly suspicious for CIS especially in the setting of smoking history -recommend TURBT/bladder biopsy -Will also plan for intravesical gemcitabine - Urine cytology and culture today - Risk of the surgery were discussed in detail today including the risk of bleeding, infection, damage surrounding structures, irritative urinary symptoms amongst others.  All questions were answered. -Will need cardiac clearance to hold Brilinta     Dustin Gimenez, MD

## 2024-03-31 NOTE — Progress Notes (Signed)
 Surgical Physician Order Form [facility]  Dr. Dustin Gimenez, MD  * Scheduling expectation : Next Available  *Length of Case:   *Clearance needed: yes  *Anticoagulation Instructions: hold Brilanta - ok to sub for     ASA  *Aspirin  Instructions: Ok to continue Aspirin   *Post-op visit Date/Instructions:  1 week post op  *Diagnosis: Bladder Lesion  *Procedure:  TURBT <2cm (16109), instillation of intravesical gemcitabine   Additional orders: Gemcitabine 2000mg  bladder instillation  -Admit type: OUTpatient  -Anesthesia: General  -VTE Prophylaxis Standing Order SCD's       Other:   -Standing Lab Orders Per Anesthesia    Lab other: None  -Standing Test orders EKG/Chest x-ray per Anesthesia       Test other:   - Medications:  Ancef 2gm IV  -Other orders:  N/A

## 2024-04-02 ENCOUNTER — Telehealth: Payer: Self-pay

## 2024-04-02 NOTE — Telephone Encounter (Signed)
   Name: [name] [name]  DOB: [birth year]  MRN: [id number]  Primary Cardiologist: Randene Bustard, MD  Chart reviewed as part of pre-operative protocol coverage. Because of [name] [name]'s past medical history and time since last visit, he will require a follow-up in-office visit in order to better assess preoperative cardiovascular risk.  Pre-op covering staff: - Please schedule appointment and call patient to inform them. If patient already had an upcoming appointment within acceptable timeframe, please add "pre-op clearance" to the appointment notes so provider is aware. - Please contact requesting surgeon's office via preferred method (i.e, phone, fax) to inform them of need for appointment prior to surgery.  He may hold Brilinta  for 5-7 days prior to procedure. Please resume Brilinta  as soon as possible postprocedure, at the discretion of the surgeon.    Ava Boatman, NP  04/02/2024, 4:08 PM

## 2024-04-02 NOTE — Progress Notes (Signed)
  Phone Number: [phone] for Surgical Coordinator Fax Number: [phone]  REQUEST FOR SURGICAL CLEARANCE       Date: Date: 04/02/24  Faxed to: Dr. Addie Holstein  Surgeon: Dr. Dustin Gimenez, MD     Date of Surgery: May 29th, 2025  Operation:  Transurethral Resection of Bladder Tumor with Intravesical Instillation of Gemcitabine   Anesthesia Type: General   Diagnosis: Bladder Lesion  Patient Requires:   Cardiac / Vascular Clearance : Yes  Reason: Will need patient to hold Brilinta  prior to surgery.    Risk Assessment:    Low   []       Moderate   []     High   []           This patient is optimized for surgery  YES []       NO   []    I recommend further assessment/workup prior to surgery. YES []      NO  []   Appointment scheduled for: _______________________   Further recommendations: ____________________________________     Physician Signature:__________________________________   Printed Name: ________________________________________   Date: _________________

## 2024-04-02 NOTE — Telephone Encounter (Signed)
   Pre-operative Risk Assessment    Patient Name: [name] [name]  DOB: 1961/01/22 MRN: [id number]   Date of last office visit: 12/30/22 Randene Bustard, MD Date of next office visit: NONE   Request for Surgical Clearance    Procedure:   TRANSURETHRAL RESECTION OF BLADDER TUMOR WITH INTRAVESICAL INSTILLATION OF GEMCITABINE  Date of Surgery:  Clearance 04/29/24                                Surgeon:  DR Dustin Gimenez Surgeon's Group or Practice Name:  [facility] Phone number:  [phone] Fax number:  [phone]   Type of Clearance Requested:   - Medical  - Pharmacy:  Hold Ticagrelor  (Brilinta )     Type of Anesthesia:  General    Additional requests/questions:    SignedCollin Deal   04/02/2024, 1:49 PM

## 2024-04-02 NOTE — Telephone Encounter (Signed)
 Pt scheduled for IN OFFICE Preop appt 04/20/24 w/ Lawana Pray, NP

## 2024-04-02 NOTE — Telephone Encounter (Signed)
 Per Dr. Ace Holder, Patient is to be scheduled for  Transurethral Resection of Bladder Tumor with Intravesical Instillation of Gemcitabine   Mr. Supino was contacted and possible surgical dates were discussed, Thursday May 29th, 2025 was agreed upon for surgery.   Patient was directed to call [phone] between 1-3pm the day before surgery to find out surgical arrival time.  Instructions were given not to eat or drink from midnight on the night before surgery and have a driver for the day of surgery. On the surgery day patient was instructed to enter through the [facility] entrance of [facility] report the Same Day Surgery desk.   Pre-Admit Testing will be in contact via phone to set up an interview with the anesthesia team to review your history and medications prior to surgery.   Reminder of this information was sent via MyChart to the patient.

## 2024-04-02 NOTE — Progress Notes (Signed)
   [facility] Surgical Posting Form  Surgery Date: Date: 04/29/2024  Surgeon: Dr. Dustin Gimenez, MD  Inpt ( No  )   Outpt (Yes)   Obs ( No  )   Diagnosis: N32.9 Bladder Lesion  -CPT: 13244, [phone]  Surgery: Transurethral Resection of Bladder Tumor with Intravesical Instillation of Gemcitabine  Stop Anticoagulations: Yes, will need to hold Brilinta   Cardiac/Medical/Pulmonary Clearance needed: yes  Clearance needed from Dr: Addie Holstein with [facility]  Clearance request sent on: Date: 04/02/24   *Orders entered into EPIC  Date: 04/02/24   *Case booked in EPIC  Date: 03/31/2024  *Notified pt of Surgery: Date: 03/31/24  PRE-OP UA & CX: no  *Placed into Prior Authorization Work [place] Date: 04/02/24  Assistant/laser/rep:No

## 2024-04-05 LAB — CULTURE, URINE COMPREHENSIVE

## 2024-04-18 NOTE — Progress Notes (Signed)
 Cardiology Clinic Note   Patient Name: [name] [name] Date of Encounter: 04/20/2024  Primary Care Provider:  Donnie Galea, MD Primary Cardiologist:  Randene Bustard, MD  Patient Profile    [name] [name] 63 year old male presents to the clinic today for follow-up evaluation of his coronary artery disease, hyperlipidemia, hypertension, and preoperative cardiac evaluation.  Past Medical History    Past Medical History:  Diagnosis Date   Allergic rhinitis    CAD S/P percutaneous coronary angioplasty 12/03/2013   PCI-mRCA (100%-0%) => Xience Alpine DES 3.0 mm x 18 mm --> 3.5 mm; Echo 1/'15" EF 45-50%, mild C LVH, mild Inf HK, Gr 1 DD   Diabetes mellitus    type2   Fracture of right hand 1983   Hyperlipidemia    Hypertension    p t denies   Nocturia    Paresthesia    tingling in feet   STEMI (ST elevation myocardial infarction) of inf. wall 12/03/2013   p-mRCA 100% - DES PCI   Past Surgical History:  Procedure Laterality Date   KNEE SURGERY     B arthroscopic   LEFT HEART CATH AND CORONARY ANGIOGRAPHY N/A 07/16/2018   Procedure: LEFT HEART CATH AND CORONARY ANGIOGRAPHY;  Surgeon: Arty Binning, MD;  Location: [facility] INVASIVE CV LAB;  Widely patent stent in dominant RCA.  Tortuous/widely patent wraparound LAD.  Normal/tortuous LCx.  EF 55%.  Inferior HK.   LEFT HEART CATHETERIZATION WITH CORONARY ANGIOGRAM N/A 12/03/2013   Procedure: LEFT HEART CATHETERIZATION WITH CORONARY ANGIOGRAM;  Surgeon: Arleen Lacer, MD;  Location: [facility] CATH LAB;; Large- Dom RCA - m100% (DES PCI) -<RPCA & RPAV-PLx3; Large-Long LM-< LAD, RI x 2 & LCx - mild Prox LAD, otw normal LCA System.   NM MYOVIEW  LTD  06/2018   a) 11/2015: Normal; b) 7/20198: EF 51%.  Findings consistent with prior MI with peri-infarct ischemia (LOW RISK).  Small defect of mild severity in the basal inferior mid inferior location.  There is also horizontal ST segment depression in inferior leads.  Although low risk  images, the EKG changes are concerning.  There is concern for possible multivessel CAD.   PERCUTANEOUS CORONARY STENT INTERVENTION (PCI-S)  12/03/2013   mRCA - Xience Alpine DES 3.0 mm x 18 mm --> 3.5 mm   SEPTOPLASTY  1970   due to Fx Nose   TRANSTHORACIC ECHOCARDIOGRAM  11/2014   a) 12/2013 (post Inf STEMI). Mild Conc LVH; EF 45-50%, Mild Inferior HK. Grade 1 DD;; b) 11/2014. EF 55-60%. No RWMA. Moderate RV dilation but normal function.    Allergies  Allergies  Allergen Reactions   Metformin  And Related Other (See Comments)    Intolerant of more than 1000mg  a day (abdominal cramping)    History of Present Illness    [name] has a PMH of coronary artery disease, hyperlipidemia, and HTN.  He underwent LHC with PCI and DES in the setting of STEMI 12/03/13.  He received DES to his proximal-mid RCA that was 100% occluded.  His echocardiogram at that time showed LVEF of 45-50%, mild LVH, mild inferior HK and G1 DD.  Repeat echocardiogram showed improved EF up to 55-60%.  He underwent 2 subsequent stress test which were felt to be nonischemic post stenting.  He was seen in follow-up by Dr. Addie Holstein on 12/30/2022.  During that time he continued to remain stable from a cardiac standpoint.  He denied recurrent angina and CHF symptoms.  He  was continued on lisinopril , rosuvastatin , metoprolol , Vascepa , and fenofibrate .  Follow-up was planned for 1 year.  He presents to the clinic today for follow-up evaluation and preoperative cardiac evaluation.  He states he continues to be physically active at work.  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  We reviewed his past cardiac history.  We reviewed his upcoming surgery.  He is able to perform greater than 4 METS of physical activity.  I will refill his cardiac medications and plan follow-up in 12 months..  Today he denies chest pain, shortness of breath, lower  extremity edema, fatigue, palpitations, melena, hematuria, hemoptysis, diaphoresis, weakness, presyncope, syncope, orthopnea, and PND.    Home Medications    Prior to Admission medications   Medication Sig Start Date End Date Taking? Authorizing Provider  B-D UF [name] MINI PEN NEEDLES 31G X 5 MM MISC USE AS DIRECTED WITH INSULIN  PEN 07/31/23   Donnie Galea, MD  BRILINTA  60 MG TABS tablet TAKE 1 TABLET BY MOUTH TWICE  DAILY 01/07/24   Arleen Lacer, MD  fenofibrate  160 MG tablet TAKE 1 TABLET BY MOUTH DAILY 01/07/24   Arleen Lacer, MD  fexofenadine (ALLEGRA) 180 MG tablet Take 180 mg by mouth in the morning.    [provider]  folic acid  (FOLVITE ) 400 MCG tablet TAKE 1 TABLET BY MOUTH DAILY 05/13/23   Donnie Galea, MD  insulin  glargine (LANTUS  SOLOSTAR) 100 UNIT/ML Solostar Pen Inject 20 Units into the skin daily. Patient taking differently: Inject 13 Units into the skin at bedtime. 01/20/24   Donnie Galea, MD  JARDIANCE  10 MG TABS tablet TAKE 1 TABLET BY MOUTH DAILY  BEFORE BREAKFAST 07/11/23   Donnie Galea, MD  lisinopril  (ZESTRIL ) 2.5 MG tablet TAKE 1 TABLET BY MOUTH DAILY 01/07/24   Arleen Lacer, MD  metFORMIN  (GLUCOPHAGE ) 500 MG tablet Take 1 tablet (500 mg total) by mouth 2 (two) times daily with a meal. 11/13/23   Donnie Galea, MD  metoprolol  tartrate (LOPRESSOR ) 25 MG tablet TAKE 1 TABLET BY MOUTH TWICE  DAILY 11/11/23   Arleen Lacer, MD  nitroGLYCERIN  (NITROSTAT ) 0.4 MG SL tablet DISSOLVE 1 TABLET UNDER THE TONGUE EVERY 5 MINUTES AS  NEEDED FOR CHEST PAIN. MAX  OF 3 TABLETS IN 15 MINUTES. CALL 911 IF PAIN PERSISTS. 08/13/22   Arleen Lacer, MD  Omega-3 Fatty Acids (OMEGA 3 PO) Take 2 capsules by mouth in the morning and at bedtime.    [provider]  [facility] DELICA LANCETS 33G MISC Use as instructed to check blood sugar once daily or as needed.  Diagnosis:  E11.49  Non insulin  dependent. 06/19/17   Donnie Galea, MD  [facility] VERIO test  strip TEST ONCE DAILY AND AS NEEDED 06/10/23   Donnie Galea, MD  rosuvastatin  (CRESTOR ) 40 MG tablet TAKE 1 TABLET BY MOUTH DAILY Patient taking differently: Take 40 mg by mouth every evening. 11/11/23   Arleen Lacer, MD  tetrahydrozoline-zinc (VISINE-AC) 0.05-0.25 % ophthalmic solution Place 1-2 drops into both eyes 2 (two) times daily as needed (allergies/after lawnmowing).    [provider]  vitamin B-12 (CYANOCOBALAMIN ) 1000 MCG tablet Take 1 tablet (1,000 mcg total) by mouth daily. 09/28/14   Donnie Galea, MD  vitamin B-6 (PYRIDOXINE) 25 MG tablet Take 1 tablet (25 mg total) by mouth daily. 09/28/14   Donnie Galea, MD  Family History    Family History  Problem Relation Age of Onset   Cancer Mother        ovarian   Hypertension Father    Hyperlipidemia Father    Cancer Father        bladder- Kidney cancer   Diabetes Father    Hearing loss Brother    Heart disease Paternal Grandfather        MI   Prostate cancer Neg Hx    Esophageal cancer Neg Hx    Inflammatory bowel disease Neg Hx    Liver disease Neg Hx    Pancreatic cancer Neg Hx    Stomach cancer Neg Hx    Colon cancer Neg Hx    He indicated that his mother is deceased. He indicated that his father is deceased. He indicated that his sister is alive. He indicated that his brother is alive. He indicated that his paternal grandfather is deceased. He indicated that the status of his neg hx is unknown.  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   Forward 10 pack year smoker, who quit on the day of his MI   Social Drivers of Health   Financial Resource Strain: Low Risk  (03/17/2023)   Overall Financial Resource Strain (CARDIA)    Difficulty of Paying Living Expenses: Not hard at all  Food Insecurity: No Food Insecurity (03/17/2023)   Hunger Vital Sign    Worried About Running Out of Food in the Last Year: Never true    Ran Out of Food in the Last Year: Never true  Transportation Needs: No Transportation Needs (03/17/2023)   PRAPARE - Administrator, Civil Service (Medical): No    Lack of Transportation (Non-Medical): No  Physical Activity: Insufficiently Active (03/17/2023)   Exercise Vital Sign    Days of Exercise per Week: 3 days    Minutes of Exercise per Session: 30 min  Stress: No Stress Concern Present (03/17/2023)   [employer] of Occupational Health - Occupational Stress Questionnaire    Feeling of Stress : Not at all  Social Connections: Unknown (03/17/2023)   Social Connection and Isolation Panel [NHANES]    Frequency of Communication with Friends and Family: More than three times a week    Frequency of Social Gatherings with Friends and Family: More than three times a week    Attends Religious  Services: Patient declined    Active Member of Clubs or Organizations: No    Attends Engineer, structural: Not on file    Marital Status: Married  Catering manager Violence: Not on file     Review of Systems    General:  No chills, fever, night  sweats or weight changes.  Cardiovascular:  No chest pain, dyspnea on exertion, edema, orthopnea, palpitations, paroxysmal nocturnal dyspnea. Dermatological: No rash, lesions/masses Respiratory: No cough, dyspnea Urologic: No hematuria, dysuria Abdominal:   No nausea, vomiting, diarrhea, bright red blood per rectum, melena, or hematemesis Neurologic:  No visual changes, wkns, changes in mental status. All other systems reviewed and are otherwise negative except as noted above.  Physical Exam    VS:  BP (!) 114/56 (BP Location: Left Arm, Patient Position: Sitting, Cuff Size: Normal)   Pulse (!) 56   Ht 5\' 10"  (1.778 m)   Wt 188 lb (85.3 kg)   BMI 26.98 kg/m  , BMI Body mass index is 26.98 kg/m. GEN: Well nourished, well developed, in no acute distress. HEENT: normal. Neck: Supple, no JVD, carotid bruits, or masses. Cardiac: RRR, no murmurs, rubs, or gallops. No clubbing, cyanosis, edema.  Radials/DP/PT 2+ and equal bilaterally.  Respiratory:  Respirations regular and unlabored, clear to auscultation bilaterally. GI: Soft, nontender, nondistended, BS + x 4. MS: no deformity or atrophy. Skin: warm and dry, no rash. Neuro:  Strength and sensation are intact. Psych: Normal affect.  Accessory Clinical Findings    Recent Labs: 01/15/2024: ALT 23; BUN 17; Creat 0.79; Hemoglobin 14.3; Platelets 209; Potassium 4.5; Sodium 139   Recent Lipid Panel    Component Value Date/Time   CHOL 174 03/10/2023 0733   CHOL 212 (H) 01/22/2018 0000   TRIG 140.0 03/10/2023 0733   HDL 46.00 03/10/2023 0733   HDL 30 (L) 01/22/2018 0000   CHOLHDL 4 03/10/2023 0733   VLDL 28.0 03/10/2023 0733   LDLCALC 100 (H) 03/10/2023 0733   LDLCALC Comment 01/22/2018 0000   LDLDIRECT 83.0 03/05/2022 0739         ECG personally reviewed by me today- EKG Interpretation Date/Time:  Tuesday Apr 20 2024 08:41:22 EDT Ventricular Rate:  56 PR Interval:  174 QRS Duration:  88 QT Interval:  370 QTC  Calculation: 357 R Axis:   -1  Text Interpretation: Sinus bradycardia Possible Inferior infarct (cited on or before 03-Dec-2013) When compared with ECG of 10-Jun-2018 10:18, No significant change was found Confirmed by Lawana Pray [phone]) on 04/20/2024 8:49:46 AM    Echocardiogram 11/28/2014   LV EF: 55% -   60%   -------------------------------------------------------------------  Indications:     49 MI, old.   -------------------------------------------------------------------  History:  PMH:   Coronary artery disease.  Risk factors:  Hypertension. Diabetes mellitus.   -------------------------------------------------------------------  Study Conclusions   - Left ventricle: The cavity size was normal. Wall thickness was    normal. Systolic function was normal. The estimated ejection    fraction was in the range of 55% to 60%. Wall motion was normal;    there were no regional wall motion abnormalities.  - Mitral valve: There was trivial regurgitation.  - Right ventricle: The cavity size was moderately dilated. Wall    thickness was normal.   Transthoracic echocardiography.  M-mode, complete 2D, spectral  Doppler, and color Doppler.  Birthdate:  Patient birthdate:  [birth year].  Age:  Patient is 63 yr old.  Sex:  Gender: male.  BMI: 30.7 kg/m^2.  Blood pressure:  104/70  Patient status:  Outpatient.  Study date:  Study date: 11/28/2014. Study time: 11:09  AM.  Location:  Echo laboratory.   -------------------------------------------------------------------   -------------------------------------------------------------------  Left ventricle:  The cavity size was normal. Wall thickness was  normal. Systolic function was normal. The estimated ejection  fraction was in the range of 55% to 60%. Wall motion was normal;  there were no regional wall motion abnormalities.   -------------------------------------------------------------------  Aortic valve:   Mildly thickened,  mildly calcified leaflets. Cusp  separation was normal.  Doppler:  Transvalvular velocity was within  the normal range. There was no stenosis. There was no  regurgitation.   -------------------------------------------------------------------  Aorta: The aorta was normal, not dilated, and non-diseased.   -------------------------------------------------------------------  Mitral valve:   Structurally normal valve.   Leaflet separation was  normal.  Doppler:  Transvalvular velocity was within the normal  range. There was no evidence for stenosis. There was trivial  regurgitation.    Peak gradient (D): 3 mm Hg.   -------------------------------------------------------------------  Left atrium:  LA volume/ BSA = 25.1 ml/m2. The atrium was normal in  size.   -------------------------------------------------------------------  Right ventricle:  The cavity size was moderately dilated. Wall  thickness was normal. Systolic function was normal.   -------------------------------------------------------------------  Pulmonic valve:    Structurally normal valve.   Cusp separation was  normal.  Doppler:  Transvalvular velocity was within the normal  range. There was no regurgitation.   -------------------------------------------------------------------  Tricuspid valve:   Structurally normal valve.   Leaflet separation  was normal.  Doppler:  Transvalvular velocity was within the normal  range. There was no regurgitation.   -------------------------------------------------------------------  Right atrium:  The atrium was normal in size.   -------------------------------------------------------------------  Pericardium: The pericardium was normal in appearance.   -------------------------------------------------------------------  Systemic veins:  Inferior vena cava: The vessel was normal in size. The  respirophasic diameter changes were in the normal range (= 50%),  consistent with normal  central venous pressure. Diameter: 15 mm.   -------------------------------------------------------------------  Post procedure conclusions  Ascending Aorta:   - The aorta was normal, not dilated, and non-diseased.   LHC 07/16/2018  Right dominant coronary anatomy with widely patent stent in the mid RCA.  No significant obstruction is noted in the right coronary. Normal left main. Tortuous but widely patent LAD that wraps around the left ventricular apex. Normal circumflex with tortuous obtuse marginal branches. Inferior wall hypokinesis, LVEF 55%, with normal left ventricular end-diastolic pressure.   RECOMMENDATIONS:   Continue aggressive risk factor modification Consider discontinuation of Brilinta  (and possibly adding Plavix) to decrease bleeding risk going forward.   Recommend Aspirin  81mg  daily for moderate CAD.  Diagnostic Dominance: Right  Intervention   Assessment & Plan   1.  Coronary artery disease-denies anginal type symptoms.  Underwent LHC in the setting of STEMI on 12/03/2013.  He received PCI with DES to his mid-proximal RCA.  Underwent repeat cardiac catheterization 8/19 which showed widely patent RCA stent.  EF was noted to be 55%. Continue lisinopril , rosuvastatin , metoprolol , Vascepa , fenofibrate  Heart healthy low-sodium diet Maintain physical activity  Essential hypertension-BP today 114/56. Maintain blood pressure log Continue lisinopril , metoprolol  Heart healthy low-sodium diet  Hyperlipidemia-LDL 100 on 03/10/23. Continue rosuvastatin , Vascepa , fenofibrate  High-fiber diet Follows with PCP  Preoperative cardiac evaluation- Procedure:  TRANSURETHRAL RESECTION OF BLADDER TUMOR WITH INTRAVESICAL INSTILLATION OF GEMCITABINE   Date of Surgery:  Clearance 04/29/24  Surgeon:  DR Dustin Gimenez Surgeon's Group or Practice Name:  [facility] Phone number:  [phone] Fax number:  [phone]     Primary  Cardiologist: Randene Bustard, MD  Chart reviewed as part of pre-operative protocol coverage. Given past medical history and time since last visit, based on ACC/AHA guidelines, Cashmere Harmes [name] would be at acceptable risk for the planned procedure without further cardiovascular testing.   His RCRI is low risk, 0.9% risk of major cardiac event.  He is able to complete greater than 4 METS of physical activity.  Patient was advised that if he develops new symptoms prior to surgery to contact our office to arrange a follow-up appointment.  He verbalized understanding.  His Brilinta  may be held for 5-7 days prior to his procedure.  Please resume Brilinta  as soon as hemostasis is achieved at the discretion of the surgeon.  I will route this recommendation to the requesting party via Epic fax function and remove from pre-op pool.     Disposition: Follow-up with Dr. Addie Holstein or me in 12 months.       Chet Cota. Allan Bacigalupi NP-C     04/20/2024, 8:49 AM [facility] [address] Office [phone] Fax [phone]    I spent 14 minutes examining this patient, reviewing medications, and using patient centered shared decision making involving their cardiac care.   I spent  20 minutes reviewing past medical history,  medications, and prior cardiac tests.

## 2024-04-20 ENCOUNTER — Ambulatory Visit: Attending: General Practice | Admitting: General Practice

## 2024-04-20 ENCOUNTER — Encounter: Payer: Self-pay | Admitting: General Practice

## 2024-04-20 VITALS — BP 114/56 | HR 56 | Ht 70.0 in | Wt 188.0 lb

## 2024-04-20 DIAGNOSIS — E782 Mixed hyperlipidemia: Secondary | ICD-10-CM

## 2024-04-20 DIAGNOSIS — I10 Essential (primary) hypertension: Secondary | ICD-10-CM

## 2024-04-20 DIAGNOSIS — I2510 Atherosclerotic heart disease of native coronary artery without angina pectoris: Secondary | ICD-10-CM

## 2024-04-20 DIAGNOSIS — R0789 Other chest pain: Secondary | ICD-10-CM

## 2024-04-20 DIAGNOSIS — E781 Pure hyperglyceridemia: Secondary | ICD-10-CM

## 2024-04-20 MED ORDER — ROSUVASTATIN CALCIUM 40 MG PO TABS: 40.0000 mg | ORAL_TABLET | Freq: Every day | ORAL | 3 refills | Status: DC

## 2024-04-20 MED ORDER — LISINOPRIL 2.5 MG PO TABS: 2.5000 mg | ORAL_TABLET | Freq: Every day | ORAL | 3 refills | Status: DC

## 2024-04-20 MED ORDER — TICAGRELOR 60 MG PO TABS: 60.0000 mg | ORAL_TABLET | Freq: Two times a day (BID) | ORAL | 3 refills | Status: DC

## 2024-04-20 MED ORDER — EMPAGLIFLOZIN 10 MG PO TABS: 10.0000 mg | ORAL_TABLET | Freq: Every day | ORAL | 3 refills | Status: DC

## 2024-04-20 MED ORDER — METOPROLOL TARTRATE 25 MG PO TABS: 25.0000 mg | ORAL_TABLET | Freq: Two times a day (BID) | ORAL | 3 refills | Status: DC

## 2024-04-20 MED ORDER — FENOFIBRATE 160 MG PO TABS: 160.0000 mg | ORAL_TABLET | Freq: Every day | ORAL | 3 refills | Status: DC

## 2024-04-20 NOTE — Patient Instructions (Signed)
 Medication Instructions:  Your physician recommends that you continue on your current medications as directed. Please refer to the Current Medication list given to you today.  *If you need a refill on your cardiac medications before your next appointment, please call your pharmacy*  Lab Work: None ordered  If you have labs (blood work) drawn today and your tests are completely normal, you will receive your results only by: MyChart Message (if you have MyChart) OR A paper copy in the mail If you have any lab test that is abnormal or we need to change your treatment, we will call you to review the results.  Testing/Procedures: None ordered  Follow-Up: At [facility], you and your health needs are our priority.  As part of our continuing mission to provide you with exceptional heart care, our providers are all part of one team.  This team includes your primary Cardiologist (physician) and Advanced Practice Providers or APPs (Physician Assistants and Nurse Practitioners) who all work together to provide you with the care you need, when you need it.  Your next appointment:   1 year(s)  Provider:   Randene Bustard, MD or Lawana Pray, NP          We recommend signing up for the patient portal called "MyChart".  Sign up information is provided on this After Visit Summary.  MyChart is used to connect with patients for Virtual Visits (Telemedicine).  Patients are able to view lab/test results, encounter notes, upcoming appointments, etc.  Non-urgent messages can be sent to your provider as well.   To learn more about what you can do with MyChart, go to [email or web address].   Other Instructions

## 2024-04-21 ENCOUNTER — Encounter
Admission: RE | Admit: 2024-04-21 | Discharge: 2024-04-21 | Disposition: A | Discharge status: Home / Self Care | Source: Ambulatory Visit | Attending: Urology | Admitting: Urology

## 2024-04-21 ENCOUNTER — Other Ambulatory Visit: Payer: Self-pay

## 2024-04-21 DIAGNOSIS — E1149 Type 2 diabetes mellitus with other diabetic neurological complication: Secondary | ICD-10-CM

## 2024-04-21 HISTORY — DX: Unspecified osteoarthritis, unspecified site: M19.90

## 2024-04-21 HISTORY — DX: Calculus of kidney: N20.0

## 2024-04-21 HISTORY — DX: Benign prostatic hyperplasia without lower urinary tract symptoms: N40.0

## 2024-04-21 HISTORY — DX: Other complications of anesthesia, initial encounter: T88.59XA

## 2024-04-21 NOTE — Patient Instructions (Addendum)
 Your procedure is scheduled on: 04/29/24 - Thursday Report to the Registration Desk on the 1st floor of the [facility]. To find out your arrival time, please call [phone] between 1PM - 3PM on: 04/28/24 - Wednesday If your arrival time is 6:00 am, do not arrive before that time as the [facility] entrance doors do not open until 6:00 am.  REMEMBER: Instructions that are not followed completely may result in serious medical risk, up to and including death; or upon the discretion of your surgeon and anesthesiologist your surgery may need to be rescheduled.  Do not eat food or drink any liquids after midnight the night before surgery.  No gum chewing or hard candies.  One week prior to surgery: Stop Anti-inflammatories (NSAIDS) such as Advil, Aleve, Ibuprofen, Motrin, Naproxen, Naprosyn and Aspirin  based products such as Excedrin, Goody's Powder, BC Powder.You may take Tylenol  if needed for pain up until the day of surgery.  Stop ANY OVER THE COUNTER supplements until after surgery : Omega-3 Fatty Acids folic acid  (FOLVITE ) ,  Continue taking all of your other prescription medications up until the day of surgery.  insulin  glargine (LANTUS  SOLOSTAR) - inject 1/2 of your bedtime dose on the night before your surgery.  HOLD empagliflozin  (JARDIANCE ) beginning 04/26/24.  HOLD metFORMIN  (GLUCOPHAGE ) beginning 04/27/24.  HOLD ticagrelor  (BRILINTA ) beginning 04/22/25.  ON THE DAY OF SURGERY ONLY TAKE THESE MEDICATIONS WITH SIPS OF WATER:  metoprolol  tartrate    No Alcohol for 24 hours before or after surgery.  No Smoking including e-cigarettes for 24 hours before surgery.  No chewable tobacco products for at least 6 hours before surgery.  No nicotine patches on the day of surgery.  Do not use any "recreational" drugs for at least a week (preferably 2 weeks) before your surgery.  Please be advised that the combination of cocaine and anesthesia may have negative outcomes, up to and  including death. If you test positive for cocaine, your surgery will be cancelled.  On the morning of surgery brush your teeth with toothpaste and water, you may rinse your mouth with mouthwash if you wish. Do not swallow any toothpaste or mouthwash.  Do not wear jewelry, make-up, hairpins, clips or nail polish.  For welded (permanent) jewelry: bracelets, anklets, waist bands, etc.  Please have this removed prior to surgery.  If it is not removed, there is a chance that hospital personnel will need to cut it off on the day of surgery.  Do not wear lotions, powders, or perfumes.   Do not shave body hair from the neck down 48 hours before surgery.  Contact lenses, hearing aids and dentures may not be worn into surgery.  Do not bring valuables to the hospital. [facility] is not responsible for any missing/lost belongings or valuables.   Notify your doctor if there is any change in your medical condition (cold, fever, infection).  Wear comfortable clothing (specific to your surgery type) to the hospital.  After surgery, you can help prevent lung complications by doing breathing exercises.  Take deep breaths and cough every 1-2 hours. Your doctor may order a device called an Incentive Spirometer to help you take deep breaths.  When coughing or sneezing, hold a pillow firmly against your incision with both hands. This is called "splinting." Doing this helps protect your incision. It also decreases belly discomfort.  If you are being admitted to the hospital overnight, leave your suitcase in the car. After surgery it may be brought to your  room.  In case of increased patient census, it may be necessary for you, the patient, to continue your postoperative care in the Same Day Surgery department.  If you are being discharged the day of surgery, you will not be allowed to drive home. You will need a responsible individual to drive you home and stay with you for 24 hours after surgery.   If  you are taking public transportation, you will need to have a responsible individual with you.  Please call the Pre-admissions Testing Dept. at [phone] if you have any questions about these instructions.  Surgery Visitation Policy:  Patients having surgery or a procedure may have two visitors.  Children under the age of 55 must have an adult with them who is not the patient.  Inpatient Visitation:    Visiting hours are 7 a.m. to 8 p.m. Up to four visitors are allowed at one time in a patient room. The visitors may rotate out with other people during the day.  One visitor age 77 or older may stay with the patient overnight and must be in the room by 8 p.m.

## 2024-04-23 ENCOUNTER — Encounter
Admission: RE | Admit: 2024-04-23 | Discharge: 2024-04-23 | Disposition: A | Discharge status: Home / Self Care | Source: Ambulatory Visit | Attending: Urology | Admitting: Urology

## 2024-04-23 DIAGNOSIS — Z01818 Encounter for other preprocedural examination: Secondary | ICD-10-CM | POA: Diagnosis present

## 2024-04-23 DIAGNOSIS — E1149 Type 2 diabetes mellitus with other diabetic neurological complication: Secondary | ICD-10-CM | POA: Diagnosis not present

## 2024-04-23 LAB — BASIC METABOLIC PANEL WITH GFR
Anion gap: 10 (ref 5–15)
BUN: 21 mg/dL (ref 8–23)
CO2: 24 mmol/L (ref 22–32)
Calcium: 9.2 mg/dL (ref 8.9–10.3)
Chloride: 100 mmol/L (ref 98–111)
Creatinine, Ser: 1 mg/dL (ref 0.61–1.24)
GFR, Estimated: >60 mL/min (ref >60)
Glucose, Bld: 226 mg/dL — ABNORMAL HIGH (ref 70–99)
Potassium: 3.8 mmol/L (ref 3.5–5.1)
Sodium: 134 mmol/L — ABNORMAL LOW (ref 135–145)

## 2024-04-23 LAB — CBC
HCT: 39.3 % (ref 39.0–52.0)
Hemoglobin: 13.5 g/dL (ref 13.0–17.0)
MCH: 29.8 pg (ref 26.0–34.0)
MCHC: 34.4 g/dL (ref 30.0–36.0)
MCV: 86.8 fL (ref 80.0–100.0)
Platelets: 205 10*3/uL (ref 150–400)
RBC: 4.53 MIL/uL (ref 4.22–5.81)
RDW: 13.1 % (ref 11.5–15.5)
WBC: 5.7 10*3/uL (ref 4.0–10.5)
nRBC: 0 % (ref 0.0–0.2)

## 2024-04-26 ENCOUNTER — Other Ambulatory Visit: Payer: Self-pay | Admitting: Cardiology

## 2024-04-27 ENCOUNTER — Encounter: Payer: Self-pay | Admitting: Urology

## 2024-04-27 ENCOUNTER — Telehealth: Payer: Self-pay

## 2024-04-27 NOTE — Telephone Encounter (Signed)
 Received clearance from [facility]- Followed up with pt. Since I have been out of the office. Patient has been holding Brilinta  since 04/22/24. Advised patient I would document this.

## 2024-04-27 NOTE — Progress Notes (Signed)
 Perioperative / Anesthesia Services  Pre-Admission Testing Clinical Review / Pre-Operative Anesthesia Consult  Date: 04/27/24  PATIENT DEMOGRAPHICS: Name: [name] [name] DOB: [birth year] MRN:   [id number]  Note: Available PAT nursing documentation and vital signs have been reviewed. Clinical nursing staff has updated patient's PMH/PSHx, current medication list, and drug allergies/intolerances to ensure complete and comprehensive history available to assist care teams in MDM as it pertains to the aforementioned surgical procedure and anticipated anesthetic course. Extensive review of available clinical information personally performed. [place] PMH and PSHx updated with any diagnoses/procedures that  may have been inadvertently omitted during his intake with the pre-admission testing department's nursing staff.  PLANNED SURGICAL PROCEDURE(S):    Case: [id number] Date/Time: 04/29/24 1015   Procedures:      TURBT (TRANSURETHRAL RESECTION OF BLADDER TUMOR)     INSTILLATION, BLADDER - GEMCITABINE   Anesthesia type: General   Diagnosis: Lesion of bladder [N32.9]   Pre-op diagnosis: Bladder Lesion   Location: [facility] OR ROOM 10 / [facility] ORS FOR ANESTHESIA GROUP   Surgeons: Dustin Gimenez, MD     CLINICAL DISCUSSION: [name] [name] is a 63 y.o. male who is submitted for pre-surgical anesthesia review and clearance prior to him undergoing the above procedure. Patient is a Former Smoker (10 pack years; quit 12/2013). Pertinent PMH includes: CAD, inferior STEMI, diastolic dysfunction, BILATERAL carotid artery disease, HTN, HLD, T2DM, nephrolithiasis, BPH, bladder lesion, nocturia.  Patient is followed by cardiology Addie Holstein, MD). He was last seen in the cardiology clinic on 04/20/2024; notes reviewed. At the time of his clinic visit, patient doing well overall from a cardiovascular perspective. Patient denied any chest pain, shortness of breath, PND, orthopnea, palpitations, significant  peripheral edema, weakness, fatigue, vertiginous symptoms, or presyncope/syncope. Patient with a past medical history significant for cardiovascular diagnoses. Documented physical exam was grossly benign, providing no evidence of acute exacerbation and/or decompensation of the patient's known cardiovascular conditions.  Patient suffered an inferior wall STEMI on 12/03/2013.  He underwent diagnostic LEFT heart catheterization revealing 100% occlusion of the mid RCA.  Subsequent PCI placing a 3.0 x 18 mm Xience Alpine DES x 1 to the mid RCA lesion.  Procedure yielded excellent angiographic result and TIMI-3 flow.  Most recent TTE performed on 11/28/2014 revealed a normal left ventricular systolic function with an EF of 55-60%. There were no regional wall motion abnormalities.  Left ventricular diastolic Doppler parameters were normal. Right ventricular cavity size was moderately enlarged with normal systolic function. There was trivial mitral valve regurgitation. All transvalvular gradients were noted to be normal providing no evidence suggestive of valvular stenosis. Aorta normal in size with no evidence of ectasia or aneurysmal dilatation.  Patient with known BILATERAL carotid artery disease.  Last carotid Doppler study was performed on 01/16/2017 revealing 1-39% stenosis of the patient's BILATERAL internal carotid arteries.  Vertebral arteries with antegrade flow.  Normal flow hemodynamics noted within the subclavians.  Most recent myocardial perfusion imaging study was performed on 06/23/2018 revealing a mildly decreased left ventricular systolic function with an EF of 45-55%.  There were no regional wall motion abnormalities.  No artifact or left ventricular cavity size enlargement appreciated on review of imaging. SPECT images demonstrated a small defect of moderate severity present in the basal inferior and mid inferior territories. 1 mm horizontal ST segment depression noted in the inferior leads  beginning 6 minutes under stress and returned to baseline after less than 1 minute of recovery.  Findings consistent with inferobasal ischemia  in the setting of known prior myocardial infarction with peri-infarct ischemia.  Although the perfusion abnormalities are very limited, ECG changes, with multiple episodes of exercise-induced NSVT and ST depression, or concerning for possible multivessel disease.  Overall, low risk nuclear study with some worrisome findings.  Repeat diagnostic LEFT heart catheterization was performed on 07/16/2018 revealing a normal left ventricular systolic function with an EF of 55%.  LVEDP was normal.  Inferior wall hypokinesis was observed.  LAD was noted to be tortuous, however widely patent wrapping around the left ventricular apex.  20% stenosis of the ostial to proximal LAD and 10% stenosis of the mid RCA was observed.  Previously placed stent to the mid RCA widely patent.  Given the nonobstructive nature of the patient's coronary artery disease, the decision was made to defer further intervention opting for medical management.  Following MI and stent placement, patient remains on daily antithrombotic therapy using ticagrelor .  Patient reported be compliant with therapy with no evidence reports of GI/GU related bleeding. Blood pressure well controlled at 114/56 mmHg on currently prescribed ACEi (lisinopril ) and beta-blocker (metoprolol  tartrate) therapies.   Patient has a supply of short acting nitrates (NTG) to use on a as needed basis for recurrent angina/anginal equivalent symptoms; denied recent use. Patient is on fenofibrate  + rosuvastatin  + omega-3 fatty acid for his HLD diagnosis and ASCVD prevention.  T2DM loosely controlled on currently prescribed regimen; last HgbA1c was 7.5% when checked on 01/20/2024. In the setting of known cardiovascular diagnoses and concurrent T2DM, patient is taking an SGLT2i (empagliflozin ) for added cardiovascular and renovascular protection.  Patient does not have an OSAH diagnosis. No changes were made to his medication regimen during his visit with cardiology.  Patient scheduled to follow-up with outpatient cardiology in 1 year or sooner if needed.  [name] [name] [name] is scheduled for an elective TURBT (TRANSURETHRAL RESECTION OF BLADDER TUMOR); INSTILLATION, BLADDER on 04/29/2024 with Dr. Dustin Gimenez, MD.  Given patient's past medical history significant for cardiovascular diagnoses, presurgical cardiac clearance was sought by the PAT team.  Per cardiology, "RCRI is low risk, 0.9% risk of major cardiac event. He is able to complete greater than 4 METS of physical activity. Given past medical history and time since last visit, based on ACC/AHA guidelines, Brayam Boeke [name] would be at ACCEPTABLE risk for the planned procedure without further cardiovascular testing".   Again, this patient is on daily antithrombotic therapy. He has been instructed on recommendations for holding his ticagrelor  for 7 days prior to his procedure with plans to restart as soon as postoperative bleeding risk felt to be minimized by his primary attending surgeon. The patient has been instructed that his last dose of should be on 04/21/2024.  Patient reports previous perioperative complications with anesthesia in the past.  Patient has experienced (+) delayed/prolonged emergence from anesthesia in the past.  In review his EMR, there are no records available for review pertaining to any anesthetic courses within the [facility] system in the recent past.   MOST RECENT VITAL SIGNS:    04/20/2024    8:38 AM 03/31/2024    8:26 AM 02/12/2024   11:27 AM  Vitals with BMI  Height 5\' 10"  5\' 10"  5\' 10"   Weight 188 lbs 188 lbs 190 lbs 6 oz  BMI 26.98 26.98 27.32  Systolic 114 133 409  Diastolic 56 79 75  Pulse 56 62 57   PROVIDERS/SPECIALISTS: NOTE: Primary physician provider listed below. Patient may have been seen by APP  or partner within same practice.    PROVIDER ROLE / SPECIALTY LAST Gentry Kief, MD Urology (Surgeon) 03/31/2024  Donnie Galea, MD Primary Care Provider 01/20/2024  Randene Bustard, MD Cardiology 04/20/2024   ALLERGIES: Allergies  Allergen Reactions   Metformin  And Related Other (See Comments)    Intolerant of more than 1000mg  a day (abdominal cramping)   CURRENT HOME MEDICATIONS: No current facility-administered medications for this encounter.    fexofenadine (ALLEGRA) 180 MG tablet   folic acid  (FOLVITE ) 400 MCG tablet   insulin  glargine (LANTUS  SOLOSTAR) 100 UNIT/ML Solostar Pen   metFORMIN  (GLUCOPHAGE ) 500 MG tablet   nitroGLYCERIN  (NITROSTAT ) 0.4 MG SL tablet   Omega-3 Fatty Acids (OMEGA 3 PO)   tetrahydrozoline-zinc (VISINE-AC) 0.05-0.25 % ophthalmic solution   vitamin B-12 (CYANOCOBALAMIN ) 1000 MCG tablet   vitamin B-6 (PYRIDOXINE) 25 MG tablet   B-D UF [name] MINI PEN NEEDLES 31G X 5 MM MISC   empagliflozin  (JARDIANCE ) 10 MG TABS tablet   fenofibrate  160 MG tablet   lisinopril  (ZESTRIL ) 2.5 MG tablet   metoprolol  tartrate (LOPRESSOR ) 25 MG tablet   ONETOUCH DELICA LANCETS 33G MISC   ONETOUCH VERIO test strip   rosuvastatin  (CRESTOR ) 40 MG tablet   ticagrelor  (BRILINTA ) 60 MG TABS tablet   HISTORY: Past Medical History:  Diagnosis Date   Allergic rhinitis    Aortic atherosclerosis (HCC)    Arthritis    Bilateral carotid artery disease (HCC)    BPH (benign prostatic hyperplasia)    CAD (coronary artery disease) 12/03/2013   a.) Inferior STEMI --> LHC/PCI 12/03/2013: 100% thrombotic occlusion mRCA (3 x 18 mm Xience Alpine DES); b.) LHC 07/16/2018: 20 o-pLAD, 10% mRCA  - med mgmt   Complication of anesthesia    a.) delayed/prolonged emergence   Diastolic dysfunction    Fracture of right hand 1983   Hepatic steatosis    Hyperlipidemia    Hypertension    Lesion of urinary bladder    Long term current use of ticagrelor  therapy    Nocturia    Paresthesia of both feet    Renal calculi     ST elevation myocardial infarction (STEMI) of inferior wall (HCC) 12/03/2013   a.) LCH/PCI 12/03/2013: 100% mRCA (3 x 18 mm Xience Alpine DES)   T2DM (type 2 diabetes mellitus) (HCC)    Past Surgical History:  Procedure Laterality Date   COLONOSCOPY     x 2   KNEE SURGERY     B arthroscopic   LEFT HEART CATH AND CORONARY ANGIOGRAPHY N/A 07/16/2018   Procedure: LEFT HEART CATH AND CORONARY ANGIOGRAPHY;  Surgeon: Arty Binning, MD;  Location: [facility] INVASIVE CV LAB;  Widely patent stent in dominant RCA.  Tortuous/widely patent wraparound LAD.  Normal/tortuous LCx.  EF 55%.  Inferior HK.   LEFT HEART CATHETERIZATION WITH CORONARY ANGIOGRAM N/A 12/03/2013   Procedure: LEFT HEART CATHETERIZATION WITH CORONARY ANGIOGRAM;  Surgeon: Arleen Lacer, MD;  Location: [facility] CATH LAB;; Large- Dom RCA - m100% (DES PCI) -<RPCA & RPAV-PLx3; Large-Long LM-< LAD, RI x 2 & LCx - mild Prox LAD, otw normal LCA System.   NM MYOVIEW  LTD  06/2018   a) 11/2015: Normal; b) 7/20198: EF 51%.  Findings consistent with prior MI with peri-infarct ischemia (LOW RISK).  Small defect of mild severity in the basal inferior mid inferior location.  There is also horizontal ST segment depression in inferior leads.  Although low risk images, the EKG changes are concerning.  There is concern  for possible multivessel CAD.   PERCUTANEOUS CORONARY STENT INTERVENTION (PCI-S)  12/03/2013   mRCA - Xience Alpine DES 3.0 mm x 18 mm --> 3.5 mm   SEPTOPLASTY  1970   due to Fx Nose   TRANSTHORACIC ECHOCARDIOGRAM  11/2014   a) 12/2013 (post Inf STEMI). Mild Conc LVH; EF 45-50%, Mild Inferior HK. Grade 1 DD;; b) 11/2014. EF 55-60%. No RWMA. Moderate RV dilation but normal function.   Family History  Problem Relation Age of Onset   Ovarian cancer Mother    Bladder Cancer Father    Hypertension Father    Hyperlipidemia Father    Diabetes Father    Kidney cancer Father    Hearing loss Brother    Heart disease Paternal Grandfather    Heart attack  Paternal Grandfather    Prostate cancer Neg Hx    Esophageal cancer Neg Hx    Inflammatory bowel disease Neg Hx    Liver disease Neg Hx    Pancreatic cancer Neg Hx    Stomach cancer Neg Hx    Colon cancer Neg Hx    Social History   Tobacco Use   Smoking status: Former    Current packs/day: 0.00    Average packs/day: 0.5 packs/day for 20.0 years (10.0 ttl pk-yrs)    Types: Cigarettes    Start date: 12/03/1993    Quit date: 12/03/2013    Years since quitting: 10.4   Smokeless tobacco: Never  Substance Use Topics   Alcohol use: Yes    Comment: occ on weekends   LABS:  Hospital Outpatient Visit on 04/23/2024  Component Date Value Ref Range Status   WBC 04/23/2024 5.7  4.0 - 10.5 K/uL Final   RBC 04/23/2024 4.53  4.22 - 5.81 MIL/uL Final   Hemoglobin 04/23/2024 13.5  13.0 - 17.0 g/dL Final   HCT 16/09/9603 39.3  39.0 - 52.0 % Final   MCV 04/23/2024 86.8  80.0 - 100.0 fL Final   MCH 04/23/2024 29.8  26.0 - 34.0 pg Final   MCHC 04/23/2024 34.4  30.0 - 36.0 g/dL Final   RDW 54/08/8118 13.1  11.5 - 15.5 % Final   Platelets 04/23/2024 205  150 - 400 K/uL Final   nRBC 04/23/2024 0.0  0.0 - 0.2 % Final   Performed at [facility], [address] [place], [place] [address]   Sodium 04/23/2024 134 (L)  135 - 145 mmol/L Final   Potassium 04/23/2024 3.8  3.5 - 5.1 mmol/L Final   Chloride 04/23/2024 100  98 - 111 mmol/L Final   CO2 04/23/2024 24  22 - 32 mmol/L Final   Glucose, Bld 04/23/2024 226 (H)  70 - 99 mg/dL Final   Glucose reference range applies only to samples taken after fasting for at least 8 hours.   BUN 04/23/2024 21  8 - 23 mg/dL Final   Creatinine, Ser 04/23/2024 1.00  0.61 - 1.24 mg/dL Final   Calcium  04/23/2024 9.2  8.9 - 10.3 mg/dL Final   GFR, Estimated 04/23/2024 >60  >60 mL/min Final   Comment: (NOTE) Calculated using the CKD-EPI Creatinine Equation (2021)    Anion gap 04/23/2024 10  5 - 15 Final   Performed at [facility], [address], [place], [place] [address]  Procedure visit on 03/31/2024  Component Date Value Ref Range Status   Specific Gravity, UA 03/31/2024 1.015  1.005 - 1.030 Final   pH, UA 03/31/2024 5.5  5.0 - 7.5 Final  Color, UA 03/31/2024 Yellow  Yellow Final   Appearance Ur 03/31/2024 Clear  Clear Final   Leukocytes,UA 03/31/2024 Negative  Negative Final   Protein,UA 03/31/2024 Negative  Negative/Trace Final   Glucose, UA 03/31/2024 3+ (A)  Negative Final   Ketones, UA 03/31/2024 Trace (A)  Negative Final   RBC, UA 03/31/2024 Negative  Negative Final   Bilirubin, UA 03/31/2024 Negative  Negative Final   Urobilinogen, Ur 03/31/2024 1.0  0.2 - 1.0 mg/dL Final   Nitrite, UA 65/78/4696 Negative  Negative Final   Microscopic Examination 03/31/2024 See below:   Final   Urine Culture, Comprehensive 03/31/2024 Final report   Final   Organism ID, Bacteria 03/31/2024 Comment   Final   No growth in 36 - 48 hours.   WBC, UA 03/31/2024 0-5  0 - 5 /hpf Final   RBC, Urine 03/31/2024 0-2  0 - 2 /hpf Final   Epithelial Cells (non renal) 03/31/2024 0-10  0 - 10 /hpf Final   Bacteria, UA 03/31/2024 None seen  None seen/Few Final    ECG: Date: 04/20/2024  Time ECG obtained: 0841 AM Rate: 56 bpm Rhythm: sinus bradycardia Axis (leads I and aVF): normal Intervals: PR 174 ms. QRS 88 ms. QTc 357 ms. ST segment and T wave changes: No evidence of acute T wave abnormalities or significant ST segment elevation or depression.  Evidence of a possible, age undetermined, prior infarct:  Yes; inferior Comparison: Similar to previous tracing obtained on 12/30/2022   IMAGING / PROCEDURES: CT HEMATURIA WORKUP performed on 01/30/2024 Small bilateral nonobstructive renal calculi. No ureteral calculi or hydronephrosis No solid mass or suspicious contrast enhancement of the kidneys or urinary tract. No urinary tract filling defect on delayed phase imaging. Prostatomegaly. Hepatic steatosis. Coronary artery disease. Aortic  atherosclerosis  LEFT HEART CATHETERIZATION AND CORONARY ANGIOGRAPHY performed on 07/16/2018 Right dominant coronary anatomy with widely patent stent in the mid RCA.  No significant obstruction is noted in the right coronary. Normal left main. Tortuous but widely patent LAD that wraps around the left ventricular apex. Normal circumflex with tortuous obtuse marginal branches. Inferior wall hypokinesis, LVEF 55%, with normal left ventricular end-diastolic pressure. Recommendations: Continue aggressive risk factor modification Consider discontinuation of Brilinta  (and possibly adding Plavix) to decrease bleeding risk going forward.     MYOCARDIAL PERFUSION IMAGING STUDY (LEXISCAN) performed on 06/23/2018 The left ventricular ejection fraction is mildly decreased (45-54%). Nuclear stress EF: 51%. There is a small defect of mild severity present in the basal inferior and mid inferior location (small area of inferobasal ischemia). Horizontal ST segment depression ST segment depression of 1 mm was noted during stress in the aVF, [name] and II leads, beginning at 6 minutes of stress, and returning to baseline after less than 1 minute of recovery. T wave inversion was noted during stress. Findings consistent with prior myocardial infarction with peri-infarct ischemia  Although the perfusion abnormalities are very limited, the ECG changes, with multiple episodes of exercise induced nonsustained VT and ST depression, are concerning for possible multivessel disease.  VAS US  CAROTID performed on 01/16/2017 Heterogeneous plaque, bilaterally. 1-39% bilateral ICA stenosis. Patent vertebral arteries with antegrade flow. Normal subclavian arteries, bilaterally.  TRANSTHORACIC ECHOCARDIOGRAM performed on 11/28/2014 Normal left ventricular systolic function with an EF of 55-60% Normal left ventricular diastolic Doppler parameters Right ventricle was moderately dilated  Normal right ventricular systolic  function There was trivial mitral regurgitation Normal transvalvular gradients; no valvular stenosis No pericardial effusion   IMPRESSION AND PLAN: [name] [name]  [name] has been referred for pre-anesthesia review and clearance prior to him undergoing the planned anesthetic and procedural courses. Available labs, pertinent testing, and imaging results were personally reviewed by me in preparation for upcoming operative/procedural course. [facility] medical record has been updated following extensive record review and patient interview with PAT staff.   This patient has been appropriately cleared by cardiology with an overall ACCEPTABLE risk of patient experiencing significant perioperative cardiovascular complications. Based on clinical review performed today (04/27/24), barring any significant acute changes in the patient's overall condition, it is anticipated that he will be able to proceed with the planned surgical intervention. Any acute changes in clinical condition may necessitate his procedure being postponed and/or cancelled. Patient will meet with anesthesia team (MD and/or CRNA) on the day of his procedure for preoperative evaluation/assessment. Questions regarding anesthetic course will be fielded at that time.   Pre-surgical instructions were reviewed with the patient during his PAT appointment, and questions were fielded to satisfaction by PAT clinical staff. He has been instructed on which medications that he will need to hold prior to surgery, as well as the ones that have been deemed safe/appropriate to take on the day of his procedure. As part of the general education provided by PAT, patient made aware both verbally and in writing, that he would need to abstain from the use of any illegal substances during his perioperative course. He was advised that failure to follow the provided instructions could necessitate case cancellation or result in serious perioperative complications up to  and including death. Patient encouraged to contact PAT and/or his surgeon's office to discuss any questions or concerns that may arise prior to surgery; verbalized understanding.   Renate Caroline, MSN, APRN, FNP-C, CEN [facility]  Perioperative Services Nurse Practitioner Phone: [phone] Fax: [phone] 04/27/24 9:16 AM  NOTE: This note has been prepared using Dragon dictation software. Despite my best ability to proofread, there is always the potential that unintentional transcriptional errors may still occur from this process.

## 2024-04-29 ENCOUNTER — Ambulatory Visit: Admitting: Urgent Care

## 2024-04-29 ENCOUNTER — Encounter: Payer: Self-pay | Admitting: Urology

## 2024-04-29 ENCOUNTER — Ambulatory Visit
Admission: RE | Admit: 2024-04-29 | Discharge: 2024-04-29 | Disposition: A | Discharge status: Home / Self Care | Attending: Urology | Admitting: Urology

## 2024-04-29 ENCOUNTER — Encounter
Admission: RE | Disposition: A | Discharge status: Home / Self Care | Payer: Self-pay | Source: Home / Self Care | Attending: Urology

## 2024-04-29 ENCOUNTER — Other Ambulatory Visit: Payer: Self-pay

## 2024-04-29 DIAGNOSIS — Z794 Long term (current) use of insulin: Secondary | ICD-10-CM | POA: Diagnosis not present

## 2024-04-29 DIAGNOSIS — I252 Old myocardial infarction: Secondary | ICD-10-CM | POA: Diagnosis not present

## 2024-04-29 DIAGNOSIS — I10 Essential (primary) hypertension: Secondary | ICD-10-CM | POA: Insufficient documentation

## 2024-04-29 DIAGNOSIS — C679 Malignant neoplasm of bladder, unspecified: Secondary | ICD-10-CM | POA: Diagnosis not present

## 2024-04-29 DIAGNOSIS — E785 Hyperlipidemia, unspecified: Secondary | ICD-10-CM | POA: Insufficient documentation

## 2024-04-29 DIAGNOSIS — I2510 Atherosclerotic heart disease of native coronary artery without angina pectoris: Secondary | ICD-10-CM | POA: Diagnosis not present

## 2024-04-29 DIAGNOSIS — N400 Enlarged prostate without lower urinary tract symptoms: Secondary | ICD-10-CM | POA: Insufficient documentation

## 2024-04-29 DIAGNOSIS — C672 Malignant neoplasm of lateral wall of bladder: Secondary | ICD-10-CM

## 2024-04-29 DIAGNOSIS — Z7984 Long term (current) use of oral hypoglycemic drugs: Secondary | ICD-10-CM | POA: Diagnosis not present

## 2024-04-29 DIAGNOSIS — Z87891 Personal history of nicotine dependence: Secondary | ICD-10-CM | POA: Diagnosis not present

## 2024-04-29 DIAGNOSIS — N329 Bladder disorder, unspecified: Secondary | ICD-10-CM | POA: Insufficient documentation

## 2024-04-29 DIAGNOSIS — Z7902 Long term (current) use of antithrombotics/antiplatelets: Secondary | ICD-10-CM | POA: Insufficient documentation

## 2024-04-29 DIAGNOSIS — E119 Type 2 diabetes mellitus without complications: Secondary | ICD-10-CM | POA: Diagnosis not present

## 2024-04-29 DIAGNOSIS — E1149 Type 2 diabetes mellitus with other diabetic neurological complication: Secondary | ICD-10-CM

## 2024-04-29 HISTORY — DX: Other ill-defined heart diseases: I51.89

## 2024-04-29 HISTORY — DX: Other long term (current) drug therapy: Z79.899

## 2024-04-29 HISTORY — DX: Fatty (change of) liver, not elsewhere classified: K76.0

## 2024-04-29 HISTORY — DX: Paresthesia of skin: R20.2

## 2024-04-29 HISTORY — DX: Bladder disorder, unspecified: N32.9

## 2024-04-29 HISTORY — DX: Type 2 diabetes mellitus without complications: E11.9

## 2024-04-29 HISTORY — PX: TRANSURETHRAL RESECTION OF BLADDER TUMOR: SHX2575

## 2024-04-29 HISTORY — DX: Disorder of arteries and arterioles, unspecified: I77.9

## 2024-04-29 HISTORY — DX: Atherosclerosis of aorta: I70.0

## 2024-04-29 HISTORY — PX: BLADDER INSTILLATION: SHX6893

## 2024-04-29 LAB — GLUCOSE, CAPILLARY
Glucose-Capillary: 175 mg/dL — ABNORMAL HIGH (ref 70–99)
Glucose-Capillary: 164 mg/dL — ABNORMAL HIGH (ref 70–99)

## 2024-04-29 SURGERY — TURBT (TRANSURETHRAL RESECTION OF BLADDER TUMOR)
Anesthesia: General | Site: Bladder

## 2024-04-29 MED ORDER — SODIUM CHLORIDE 0.9% FLUSH: 3.0000 mL | Freq: Two times a day (BID) | INTRAVENOUS | Status: DC

## 2024-04-29 MED ORDER — HYDROCODONE-ACETAMINOPHEN 5-325 MG PO TABS: 1.0000 | ORAL_TABLET | Freq: Four times a day (QID) | ORAL | 0 refills | Status: DC | PRN

## 2024-04-29 MED ORDER — PROPOFOL 10 MG/ML IV BOLUS
INTRAVENOUS | Status: DC | PRN
Start: 2024-04-29 — End: 2024-04-29
  Administered 2024-04-29: 100 mg via INTRAVENOUS
  Administered 2024-04-29: 25 mg via INTRAVENOUS

## 2024-04-29 MED ORDER — CEFAZOLIN SODIUM-DEXTROSE 2-4 GM/100ML-% IV SOLN
2.0000 g | INTRAVENOUS | Status: AC
  Administered 2024-04-29: 2 g via INTRAVENOUS

## 2024-04-29 MED ORDER — CEFAZOLIN SODIUM-DEXTROSE 2-4 GM/100ML-% IV SOLN
INTRAVENOUS | Status: AC
  Filled 2024-04-29: qty 100

## 2024-04-29 MED ORDER — ONDANSETRON HCL 4 MG/2ML IJ SOLN
INTRAMUSCULAR | Status: AC
  Filled 2024-04-29: qty 2

## 2024-04-29 MED ORDER — FENTANYL CITRATE (PF) 100 MCG/2ML IJ SOLN
INTRAMUSCULAR | Status: AC
  Filled 2024-04-29: qty 2

## 2024-04-29 MED ORDER — FENTANYL CITRATE (PF) 100 MCG/2ML IJ SOLN: 25.0000 ug | INTRAMUSCULAR | Status: DC | PRN

## 2024-04-29 MED ORDER — GEMCITABINE CHEMO FOR BLADDER INSTILLATION 2000 MG
INTRAVENOUS | Status: DC | PRN
  Administered 2024-04-29: 2000 mg via INTRAVESICAL

## 2024-04-29 MED ORDER — SODIUM CHLORIDE 0.9% FLUSH: 3.0000 mL | INTRAVENOUS | Status: DC | PRN

## 2024-04-29 MED ORDER — OXYBUTYNIN CHLORIDE 5 MG PO TABS
5.0000 mg | ORAL_TABLET | Freq: Three times a day (TID) | ORAL | 0 refills | Status: DC | PRN
Start: 2024-04-29 — End: 2024-05-24

## 2024-04-29 MED ORDER — SUGAMMADEX SODIUM 200 MG/2ML IV SOLN
INTRAVENOUS | Status: DC | PRN
  Administered 2024-04-29: 300 mg via INTRAVENOUS

## 2024-04-29 MED ORDER — ONDANSETRON HCL 4 MG/2ML IJ SOLN
INTRAMUSCULAR | Status: DC | PRN
  Administered 2024-04-29: 4 mg via INTRAVENOUS

## 2024-04-29 MED ORDER — OXYCODONE HCL 5 MG/5ML PO SOLN: 5.0000 mg | Freq: Once | ORAL | Status: AC | PRN

## 2024-04-29 MED ORDER — GEMCITABINE CHEMO FOR BLADDER INSTILLATION 2000 MG
2000.0000 mg | Freq: Once | INTRAVENOUS | Status: DC
  Filled 2024-04-29: qty 52.6

## 2024-04-29 MED ORDER — OXYCODONE HCL 5 MG PO TABS
5.0000 mg | ORAL_TABLET | Freq: Once | ORAL | Status: AC | PRN
  Administered 2024-04-29: 5 mg via ORAL

## 2024-04-29 MED ORDER — CHLORHEXIDINE GLUCONATE 0.12 % MT SOLN
15.0000 mL | Freq: Once | OROMUCOSAL | Status: AC
  Administered 2024-04-29: 15 mL via OROMUCOSAL

## 2024-04-29 MED ORDER — PROPOFOL 10 MG/ML IV BOLUS
INTRAVENOUS | Status: AC
  Filled 2024-04-29: qty 20

## 2024-04-29 MED ORDER — FENTANYL CITRATE (PF) 100 MCG/2ML IJ SOLN
INTRAMUSCULAR | Status: DC | PRN
Start: 2024-04-29 — End: 2024-04-29
  Administered 2024-04-29 (×2): 50 ug via INTRAVENOUS

## 2024-04-29 MED ORDER — CHLORHEXIDINE GLUCONATE 0.12 % MT SOLN
OROMUCOSAL | Status: AC
  Filled 2024-04-29: qty 15

## 2024-04-29 MED ORDER — LIDOCAINE HCL (PF) 2 % IJ SOLN
INTRAMUSCULAR | Status: AC
  Filled 2024-04-29: qty 5

## 2024-04-29 MED ORDER — ORAL CARE MOUTH RINSE: 15.0000 mL | Freq: Once | OROMUCOSAL | Status: AC

## 2024-04-29 MED ORDER — MIDAZOLAM HCL 2 MG/2ML IJ SOLN
INTRAMUSCULAR | Status: AC
  Filled 2024-04-29: qty 2

## 2024-04-29 MED ORDER — SODIUM CHLORIDE 0.9 % IV SOLN: INTRAVENOUS | Status: DC

## 2024-04-29 MED ORDER — DEXAMETHASONE SODIUM PHOSPHATE 10 MG/ML IJ SOLN
INTRAMUSCULAR | Status: AC
  Filled 2024-04-29: qty 1

## 2024-04-29 MED ORDER — SODIUM CHLORIDE 0.9 % IR SOLN
Status: DC | PRN
  Administered 2024-04-29: 6000 mL

## 2024-04-29 MED ORDER — DEXAMETHASONE SODIUM PHOSPHATE 10 MG/ML IJ SOLN
INTRAMUSCULAR | Status: DC | PRN
  Administered 2024-04-29: 5 mg via INTRAVENOUS

## 2024-04-29 MED ORDER — ROCURONIUM BROMIDE 100 MG/10ML IV SOLN
INTRAVENOUS | Status: DC | PRN
  Administered 2024-04-29: 50 mg via INTRAVENOUS

## 2024-04-29 MED ORDER — ROCURONIUM BROMIDE 10 MG/ML (PF) SYRINGE
PREFILLED_SYRINGE | INTRAVENOUS | Status: AC
  Filled 2024-04-29: qty 10

## 2024-04-29 MED ORDER — OXYCODONE HCL 5 MG PO TABS
ORAL_TABLET | ORAL | Status: AC
  Filled 2024-04-29: qty 1

## 2024-04-29 SURGICAL SUPPLY — 22 items
BAG DRAIN SIEMENS DORNER NS (MISCELLANEOUS) ×2 IMPLANT
BAG URINE DRAIN 2000ML AR STRL (UROLOGICAL SUPPLIES) ×2 IMPLANT
BRUSH SCRUB EZ 4% CHG (MISCELLANEOUS) ×2 IMPLANT
CATH URTH 16FR FL 2W BLN LF (CATHETERS) ×2 IMPLANT
DRAPE UTILITY 15X26 TOWEL STRL (DRAPES) ×2 IMPLANT
DRSG TELFA 3X4 N-ADH STERILE (GAUZE/BANDAGES/DRESSINGS) ×2 IMPLANT
ELECTRODE LOOP 22F BIPOLAR SML (ELECTROSURGICAL) IMPLANT
ELECTRODE REM PT RTRN 9FT ADLT (ELECTROSURGICAL) IMPLANT
GLOVE BIO SURGEON STRL SZ 6.5 (GLOVE) ×2 IMPLANT
GOWN STRL REUS W/ TWL LRG LVL3 (GOWN DISPOSABLE) ×4 IMPLANT
KIT TURNOVER CYSTO (KITS) ×2 IMPLANT
LOOP CUT BIPOLAR 24F LRG (ELECTROSURGICAL) IMPLANT
NDL SAFETY ECLIPSE 18X1.5 (NEEDLE) ×2 IMPLANT
PACK CYSTO AR (MISCELLANEOUS) ×2 IMPLANT
PAD ARMBOARD POSITIONER FOAM (MISCELLANEOUS) ×2 IMPLANT
SET IRRIG Y TYPE TUR BLADDER L (SET/KITS/TRAYS/PACK) ×2 IMPLANT
SOL .9 NS 3000ML IRR UROMATIC (IV SOLUTION) IMPLANT
SURGILUBE 2OZ TUBE FLIPTOP (MISCELLANEOUS) ×2 IMPLANT
SYRINGE TOOMEY IRRIG 70ML (MISCELLANEOUS) ×2 IMPLANT
WATER STERILE IRR 1000ML POUR (IV SOLUTION) ×2 IMPLANT
WATER STERILE IRR 3000ML UROMA (IV SOLUTION) IMPLANT
WATER STERILE IRR 500ML POUR (IV SOLUTION) ×2 IMPLANT

## 2024-04-29 NOTE — Transfer of Care (Signed)
 Immediate Anesthesia Transfer of Care Note  Patient: [name] [name]  Procedure(s) Performed: TURBT (TRANSURETHRAL RESECTION OF BLADDER TUMOR) (Bladder) INSTILLATION, BLADDER  Patient Location: PACU  Anesthesia Type:General  Level of Consciousness: awake  Airway & Oxygen Therapy: Patient Spontanous Breathing and Patient connected to face mask oxygen  Post-op Assessment: Report given to RN and Post -op Vital signs reviewed and stable  Post vital signs: Reviewed and stable  Last Vitals:  Vitals Value Taken Time  BP 144/74 04/29/24 1058  Temp    Pulse 49 04/29/24 1101  Resp 12 04/29/24 1101  SpO2 95 % 04/29/24 1101  Vitals shown include unfiled device data.  Last Pain:  Vitals:   04/29/24 0847  TempSrc: Temporal  PainSc: 0-No pain         Complications: No notable events documented.

## 2024-04-29 NOTE — Discharge Instructions (Signed)
Transurethral Resection of Bladder Tumor (TURBT) or Bladder Biopsy ° ° °Definition: ° Transurethral Resection of the Bladder Tumor is a surgical procedure used to diagnose and remove tumors within the bladder. TURBT is the most common treatment for early stage bladder cancer. ° °General instructions: °   ° Your recent bladder surgery requires very little post hospital care but some definite precautions. ° °Despite the fact that no skin incisions were used, the area around the bladder incisions are raw and covered with scabs to promote healing and prevent bleeding. Certain precautions are needed to insure that the scabs are not disturbed over the next 2-4 weeks while the healing proceeds. ° °Because the raw surface inside your bladder and the irritating effects of urine you may expect frequency of urination and/or urgency (a stronger desire to urinate) and perhaps even getting up at night more often. This will usually resolve or improve slowly over the healing period. You may see some blood in your urine over the first 6 weeks. Do not be alarmed, even if the urine was clear for a while. Get off your feet and drink lots of fluids until clearing occurs. If you start to pass clots or don't improve call us. ° °Diet: ° °You may return to your normal diet immediately. Because of the raw surface of your bladder, alcohol, spicy foods, foods high in acid and drinks with caffeine may cause irritation or frequency and should be used in moderation. To keep your urine flowing freely and avoid constipation, drink plenty of fluids during the day (8-10 glasses). Tip: Avoid cranberry juice because it is very acidic. ° °Activity: ° °Your physical activity doesn't need to be restricted. However, if you are very active, you may see some blood in the urine. We suggest that you reduce your activity under the circumstances until the bleeding has stopped. ° °Bowels: ° °It is important to keep your bowels regular during the postoperative  period. Straining with bowel movements can cause bleeding. A bowel movement every other day is reasonable. Use a mild laxative if needed, such as milk of magnesia 2-3 tablespoons, or 2 Dulcolax tablets. Call if you continue to have problems. If you had been taking narcotics for pain, before, during or after your surgery, you may be constipated. Take a laxative if necessary. ° ° ° °Medication: ° °You should resume your pre-surgery medications unless told not to. In addition you may be given an antibiotic to prevent or treat infection. Antibiotics are not always necessary. All medication should be taken as prescribed until the bottles are finished unless you are having an unusual reaction to one of the drugs. ° ° °[facility] °[place], [place] [address] °[phone] ° ° ° ° °

## 2024-04-29 NOTE — Anesthesia Preprocedure Evaluation (Signed)
 Anesthesia Evaluation  Patient identified by MRN, date of birth, ID band Patient awake    Reviewed: Allergy & Precautions, NPO status , Patient's Chart, lab work & pertinent test results  Airway Mallampati: III  TM Distance: >3 FB Neck ROM: full    Dental  (+) Chipped, Dental Advidsory Given   Pulmonary neg pulmonary ROS, former smoker   Pulmonary exam normal        Cardiovascular hypertension, + CAD, + Past MI and + Cardiac Stents  Normal cardiovascular exam     Neuro/Psych negative neurological ROS  negative psych ROS   GI/Hepatic negative GI ROS, Neg liver ROS,,,  Endo/Other  negative endocrine ROSdiabetes    Renal/GU      Musculoskeletal   Abdominal   Peds  Hematology negative hematology ROS (+)   Anesthesia Other Findings Past Medical History: No date: Allergic rhinitis No date: Aortic atherosclerosis (HCC) No date: Arthritis No date: Bilateral carotid artery disease (HCC) No date: BPH (benign prostatic hyperplasia) 12/03/2013: CAD (coronary artery disease)     Comment:  a.) Inferior STEMI --> LHC/PCI 12/03/2013: 100%               thrombotic occlusion mRCA (3 x 18 mm Xience Alpine DES);               b.) LHC 07/16/2018: 20 o-pLAD, 10% mRCA  - med mgmt No date: Complication of anesthesia     Comment:  a.) delayed/prolonged emergence No date: Diastolic dysfunction 1983: Fracture of right hand No date: Hepatic steatosis No date: Hyperlipidemia No date: Hypertension No date: Lesion of urinary bladder No date: Long term current use of ticagrelor  therapy No date: Nocturia No date: Paresthesia of both feet No date: Renal calculi 12/03/2013: ST elevation myocardial infarction (STEMI) of inferior  wall (HCC)     Comment:  a.) LCH/PCI 12/03/2013: 100% mRCA (3 x 18 mm Xience               Alpine DES) No date: T2DM (type 2 diabetes mellitus) (HCC)  Past Surgical History: No date: COLONOSCOPY     Comment:   x 2 No date: KNEE SURGERY     Comment:  B arthroscopic 07/16/2018: LEFT HEART CATH AND CORONARY ANGIOGRAPHY; N/A     Comment:  Procedure: LEFT HEART CATH AND CORONARY ANGIOGRAPHY;                Surgeon: Arty Binning, MD;  Location: [facility]               LAB;  Widely patent stent in dominant RCA.               Tortuous/widely patent wraparound LAD. Normal/tortuous               LCx. EF 55%. Inferior HK. 12/03/2013: LEFT HEART CATHETERIZATION WITH CORONARY ANGIOGRAM; N/A     Comment:  Procedure: LEFT HEART CATHETERIZATION WITH CORONARY               ANGIOGRAM;  Surgeon: Arleen Lacer, MD;  Location: [facility]               CATH LAB;; Large- Dom RCA - m100% (DES PCI) -<RPCA &               RPAV-PLx3; Large-Long LM-< LAD, RI x 2 & LCx - mild Prox               LAD, otw normal LCA System. 06/2018: [facility]  MYOVIEW  LTD     Comment:  a) 11/2015: Normal; b) 7/20198: EF 51%.  Findings               consistent with prior MI with peri-infarct ischemia (LOW               RISK).  Small defect of mild severity in the basal               inferior mid inferior location.  There is also horizontal              ST segment depression in inferior leads.  Although low               risk images, the EKG changes are concerning.  There is               concern for possible multivessel CAD. 12/03/2013: PERCUTANEOUS CORONARY STENT INTERVENTION (PCI-S)     Comment:  mRCA - Xience Alpine DES 3.0 mm x 18 mm --> 3.5 mm 1970: SEPTOPLASTY     Comment:  due to Fx Nose 11/2014: TRANSTHORACIC ECHOCARDIOGRAM     Comment:  a) 12/2013 (post Inf STEMI). Mild Conc LVH; EF 45-50%,               Mild Inferior HK. Grade 1 DD;; b) 11/2014. EF 55-60%. No               RWMA. Moderate RV dilation but normal function.  BMI    Body Mass Index: 26.98 kg/m      Reproductive/Obstetrics negative OB ROS                             Anesthesia Physical Anesthesia Plan  ASA: 3  Anesthesia Plan: General  ETT   Post-op Pain Management:    Induction: Intravenous  PONV Risk Score and Plan: 3 and Ondansetron , Dexamethasone and Midazolam   Airway Management Planned: Oral ETT  Additional Equipment:   Intra-op Plan:   Post-operative Plan: Extubation in OR  Informed Consent: I have reviewed the patients History and Physical, chart, labs and discussed the procedure including the risks, benefits and alternatives for the proposed anesthesia with the patient or authorized representative who has indicated his/her understanding and acceptance.     Dental Advisory Given  Plan Discussed with: Anesthesiologist, CRNA and Surgeon  Anesthesia Plan Comments: (Patient consented for risks of anesthesia including but not limited to:  - adverse reactions to medications - damage to eyes, teeth, lips or other oral mucosa - nerve damage due to positioning  - sore throat or hoarseness - Damage to heart, brain, nerves, lungs, other parts of body or loss of life  Patient voiced understanding and assent.)       Anesthesia Quick Evaluation

## 2024-04-29 NOTE — Interval H&P Note (Signed)
 History and Physical Interval Note:  04/29/2024 9:55 AM  [name] [name]  has presented today for surgery, with the diagnosis of Bladder Lesion.  The various methods of treatment have been discussed with the patient and family. After consideration of risks, benefits and other options for treatment, the patient has consented to  Procedure(s) with comments: TURBT (TRANSURETHRAL RESECTION OF BLADDER TUMOR) (N/A) INSTILLATION, BLADDER (N/A) - GEMCITABINE  as a surgical intervention.  The patient's history has been reviewed, patient examined, no change in status, stable for surgery.  I have reviewed the patient's chart and labs.  Questions were answered to the patient's satisfaction.    RRR CTAB  Dustin Gimenez

## 2024-04-29 NOTE — Anesthesia Procedure Notes (Signed)
 Procedure Name: Intubation Date/Time: 04/29/2024 10:31 AM  Performed by: Juanda Noon, CRNAPre-anesthesia Checklist: Patient identified, Patient being monitored, Timeout performed, Emergency Drugs available and Suction available Patient Re-evaluated:Patient Re-evaluated prior to induction Oxygen Delivery Method: Circle system utilized Preoxygenation: Pre-oxygenation with 100% oxygen Induction Type: IV induction Ventilation: Mask ventilation without difficulty Laryngoscope Size: Mac, McGrath and 4 Grade View: Grade I Tube type: Oral Tube size: 7.0 mm Number of attempts: 1 Airway Equipment and Method: Stylet Placement Confirmation: ETT inserted through vocal cords under direct vision, positive ETCO2 and breath sounds checked- equal and bilateral Secured at: 21 cm Tube secured with: Tape Dental Injury: Teeth and Oropharynx as per pre-operative assessment

## 2024-04-29 NOTE — Op Note (Signed)
 Date of procedure: 04/29/24  Preoperative diagnosis:  Bladder lesion concerning for bladder cancer  Postoperative diagnosis:  Same as above  Procedure: TURBT, medium Instillation of intravesical gemcitabine  Surgeon: Dustin Gimenez, MD  Anesthesia: General  Complications: None  Intraoperative findings: Early low-grade appearing papillary change on erythematous base in the left posterior lateral bladder wall with indistinct borders concerning for CIS versus early low-grade papillary tumor.  EBL: Minimal  Specimens: Bladder tumor  Drains: 60 French Foley catheter  Indication: [name] [name] is a 64 y.o. patient with hematuria found to have a discrete lesion in his bladder concerning for bladder cancer.  After reviewing the management options for treatment, he elected to proceed with the above surgical procedure(s). We have discussed the potential benefits and risks of the procedure, side effects of the proposed treatment, the likelihood of the patient achieving the goals of the procedure, and any potential problems that might occur during the procedure or recuperation. Informed consent has been obtained.  Description of procedure:  The patient was taken to the operating room and general anesthesia was induced.  The patient was placed in the dorsal lithotomy position, prepped and draped in the usual sterile fashion, and preoperative antibiotics were administered. A preoperative time-out was performed.   A 26 French resectoscope was advanced per urethra into the bladder using a blunt angled obturator.  This went easily.  The bladder was carefully inspected.  This revealed the same erythematous area on the left posterior bladder wall measuring approximately 2 cm with indistinct borders.  This area had a very fine papillary change on the surface and was concerning for CIS versus early low-grade tumor.  I used cold cup biopsy forceps at this point in time to take approximately 6  deeper specimen bites.  These were passed off the field as bladder tumor.  I then brought in a bipolar loop and using saline as the medium, resected the entirety of the tumor as well as fulgurated extensively around the edges.  Hemostasis was excellent.  At the end of the procedure, no suspicious area remained.  The bladder was then drained.  16 French Foley catheter was placed and the balloon inflated with 10 cc of sterile water.  Patient was then cleaned and dried, repositioned in the supine position and taken to the PACU in stable condition.  2000 mg of intravesical gemcitabine was instilled to the bladder and brain in place for 1 hour in the PACU.  This was well-tolerated.  The end of an hour, the catheter was drained and removed.  Plan: Patient will follow up with me next week for surgical pathology.  Dustin Gimenez, M.D.

## 2024-04-29 NOTE — Anesthesia Postprocedure Evaluation (Signed)
 Anesthesia Post Note  Patient: [name] [name]  Procedure(s) Performed: TURBT (TRANSURETHRAL RESECTION OF BLADDER TUMOR) (Bladder) INSTILLATION, BLADDER  Patient location during evaluation: PACU Anesthesia Type: General Level of consciousness: awake and alert Pain management: pain level controlled Vital Signs Assessment: post-procedure vital signs reviewed and stable Respiratory status: spontaneous breathing, nonlabored ventilation, respiratory function stable and patient connected to nasal cannula oxygen Cardiovascular status: blood pressure returned to baseline and stable Postop Assessment: no apparent nausea or vomiting Anesthetic complications: no  No notable events documented.   Last Vitals:  Vitals:   04/29/24 1225 04/29/24 1236  BP:  127/71  Pulse:  (!) 50  Resp:  18  Temp: 36.4 C (!) 36.3 C  SpO2:  98%    Last Pain:  Vitals:   04/29/24 1236  TempSrc: Temporal  PainSc: 0-No pain                 Enrique Harvest

## 2024-04-30 ENCOUNTER — Encounter: Payer: Self-pay | Admitting: Urology

## 2024-05-03 ENCOUNTER — Ambulatory Visit: Payer: Self-pay

## 2024-05-03 LAB — SURGICAL PATHOLOGY

## 2024-05-03 NOTE — Telephone Encounter (Signed)
 Chief Complaint: dizziness Symptoms: dizziness, blurry vision, nausea, clamminess, sweating, tingling and coldness in right hand (intermittent for over a year) Frequency: intermittent, 2 episodes today Pertinent Negatives: Patient denies chest pain, SOB, vomiting, diarrhea, blood in urine, unilateral numbness or weakness, changes in speech, loss of vision Disposition: [x] ED /[] Urgent Care (no appt availability in office) / [] Appointment(In office/virtual)/ []  [facility]/ [] Home Care/ [x] Refused Recommended Disposition /[] [facility]/ []  Follow-up with PCP Additional Notes: Patient states he had bladder surgery on Thursday and received chemo. He states his blood glucose this morning was 140. He states he has been getting back on his medication after his surgery. Patient at work today and unable to make the 3pm acute visit that is available with PCP, advised patient go to ED and be seen today. Patient refused. Called [facility] and notified staff of ED refusal.  Copied from CRM [id number]. Topic: Clinical - Red Word Triage >> May 03, 2024  2:13 PM Eleanore Grey wrote: Red Word that prompted transfer to Nurse Triage: feeling dizzy, lightheaded and sweaty. Started today. Reason for Disposition  Patient sounds very sick or weak to the triager  Answer Assessment - Initial Assessment Questions 1. DESCRIPTION: "Describe your dizziness."     Lightheaded and nauseated. He states this is the second time this happened to him today. He states it is "kinda both", if he leans over one way he feels like he would fall.  2. LIGHTHEADED: "Do you feel lightheaded?" (e.g., somewhat faint, woozy, weak upon standing)     Yes.  3. VERTIGO: "Do you feel like either you or the room is spinning or tilting?" (i.e. vertigo)     Yes. 4. SEVERITY: "How bad is it?"  "Do you feel like you are going to faint?" "Can you stand and walk?"   - MILD: Feels slightly dizzy, but walking normally.   - MODERATE: Feels  unsteady when walking, but not falling; interferes with normal activities (e.g., school, work).   - SEVERE: Unable to walk without falling, or requires assistance to walk without falling; feels like passing out now.      Earlier he states someone had to help him to a seated position he felt he was going to fall over. Moderate at this time.  5. ONSET:  "When did the dizziness begin?"     Today. Comes and goes. He states he had an episode around 1000 this morning. Second episode is present now.  6. AGGRAVATING FACTORS: "Does anything make it worse?" (e.g., standing, change in head position)     No.  7. HEART RATE: "Can you tell me your heart rate?" "How many beats in 15 seconds?"  (Note: not all patients can do this)       No.  8. CAUSE: "What do you think is causing the dizziness?"     He thought it was low blood sugar and states he feels better when eating.  9. RECURRENT SYMPTOM: "Have you had dizziness before?" If Yes, ask: "When was the last time?" "What happened that time?"     Yes, he states almost every day he feels like he crashes after eating. That has been going on for a couple of months.  10. OTHER SYMPTOMS: "Do you have any other symptoms?" (e.g., fever, chest pain, vomiting, diarrhea, bleeding)       Sweating/clammy, nausea, tingling and coldness in right hand (intermittent x 1 year or more)  11. PREGNANCY: "Is there any chance you are pregnant?" "When was your  last menstrual period?"       N/A.  Protocols used: Dizziness - Lightheadedness-A-AH

## 2024-05-03 NOTE — Telephone Encounter (Signed)
 He felt well this AM, then felt dizzy ~10 AM and felt better after drinking gatorade.  He notes that he doesn't feel well about 1 hour after eating.  Eating helps his sx.  No pain.  No syncope.    He doesn't feel unwell now, except for feeling tired.  He had some crackers recently and took his insulin .    D/w pt about skipping his PM metformin  this evening and monitoring sugar, in case the issue was sugar related.  He can update me in 1-2 days.  He agrees.

## 2024-05-05 ENCOUNTER — Ambulatory Visit: Payer: Self-pay | Admitting: Family Medicine

## 2024-05-06 ENCOUNTER — Ambulatory Visit (INDEPENDENT_AMBULATORY_CARE_PROVIDER_SITE_OTHER): Admitting: Urology

## 2024-05-06 VITALS — BP 138/78 | HR 68 | Ht 70.0 in | Wt 186.0 lb

## 2024-05-06 DIAGNOSIS — C689 Malignant neoplasm of urinary organ, unspecified: Secondary | ICD-10-CM | POA: Diagnosis not present

## 2024-05-06 NOTE — Progress Notes (Signed)
 Elfrieda Grise Plume,acting as a scribe for Dustin Gimenez, MD.,have documented all relevant documentation on the behalf of Dustin Gimenez, MD,as directed by  Dustin Gimenez, MD while in the presence of Dustin Gimenez, MD.  05/06/24 11:33 AM   [name] [name] Sep 09, 1961 [id number]  Referring provider: Donnie Galea, MD [address] [place],  [place] [address]  Chief Complaint  Patient presents with   Results    Pathology     HPI: 62 year old male with a personal history of high-grade, non-invasive urothelial carcinoma presents today for follow-up after TURBT performed on May 06, 2023.   He had hematuria and was found to have a suspicious bladder lesion. He had some papillary change on the erythematous basal left-posterior lateral bladder wall with indistinct borders.   Post-operative intravesical gemcitabine  was administered. He also had upper tract imaging prior to TURBT.  Surgical pathology confirmed high-grade, non-invasive urothelial carcinoma with early papillary formation.  He reports persistent hematuria with occasional clots, which began worsening on Monday or Tuesday. He is advised to increase fluid intake to prevent clot formation.   PMH: Past Medical History:  Diagnosis Date   Allergic rhinitis    Aortic atherosclerosis (HCC)    Arthritis    Bilateral carotid artery disease (HCC)    BPH (benign prostatic hyperplasia)    CAD (coronary artery disease) 12/03/2013   a.) Inferior STEMI --> LHC/PCI 12/03/2013: 100% thrombotic occlusion mRCA (3 x 18 mm Xience Alpine DES); b.) LHC 07/16/2018: 20 o-pLAD, 10% mRCA  - med mgmt   Complication of anesthesia    a.) delayed/prolonged emergence   Diastolic dysfunction    Fracture of right hand 1983   Hepatic steatosis    Hyperlipidemia    Hypertension    Lesion of urinary bladder    Long term current use of ticagrelor  therapy    Nocturia    Paresthesia of both feet    Renal calculi    ST elevation myocardial  infarction (STEMI) of inferior wall (HCC) 12/03/2013   a.) LCH/PCI 12/03/2013: 100% mRCA (3 x 18 mm Xience Alpine DES)   T2DM (type 2 diabetes mellitus) (HCC)     Surgical History: Past Surgical History:  Procedure Laterality Date   BLADDER INSTILLATION N/A 04/29/2024   Procedure: INSTILLATION, BLADDER;  Surgeon: Dustin Gimenez, MD;  Location: [facility] ORS;  Service: Urology;  Laterality: N/A;  GEMCITABINE    COLONOSCOPY     x 2   KNEE SURGERY     B arthroscopic   LEFT HEART CATH AND CORONARY ANGIOGRAPHY N/A 07/16/2018   Procedure: LEFT HEART CATH AND CORONARY ANGIOGRAPHY;  Surgeon: Arty Binning, MD;  [facility];  Widely patent stent in dominant RCA.  Tortuous/widely patent wraparound LAD.  Normal/tortuous LCx.  EF 55%.  Inferior HK.   LEFT HEART CATHETERIZATION WITH CORONARY ANGIOGRAM N/A 12/03/2013   Procedure: LEFT HEART CATHETERIZATION WITH CORONARY ANGIOGRAM;  Surgeon: Arleen Lacer, MD;  Location: [facility] CATH LAB;; Large- Dom RCA - m100% (DES PCI) -<RPCA & RPAV-PLx3; Large-Long LM-< LAD, RI x 2 & LCx - mild Prox LAD, otw normal LCA System.   NM MYOVIEW  LTD  06/2018   a) 11/2015: Normal; b) 7/20198: EF 51%.  Findings consistent with prior MI with peri-infarct ischemia (LOW RISK).  Small defect of mild severity in the basal inferior mid inferior location.  There is also horizontal ST segment depression in inferior leads.  Although low risk images, the EKG changes are  concerning.  There is concern for possible multivessel CAD.   PERCUTANEOUS CORONARY STENT INTERVENTION (PCI-S)  12/03/2013   mRCA - Xience Alpine DES 3.0 mm x 18 mm --> 3.5 mm   SEPTOPLASTY  1970   due to Fx Nose   TRANSTHORACIC ECHOCARDIOGRAM  11/2014   a) 12/2013 (post Inf STEMI). Mild Conc LVH; EF 45-50%, Mild Inferior HK. Grade 1 DD;; b) 11/2014. EF 55-60%. No RWMA. Moderate RV dilation but normal function.   TRANSURETHRAL RESECTION OF BLADDER TUMOR N/A 04/29/2024   Procedure: TURBT (TRANSURETHRAL RESECTION  OF BLADDER TUMOR);  Surgeon: Dustin Gimenez, MD;  Location: [facility] ORS;  Service: Urology;  Laterality: N/A;    Home Medications:  Allergies as of 05/06/2024       Reactions   Metformin  And Related Other (See Comments)   Intolerant of more than 1000mg  a day (abdominal cramping)        Medication List        Accurate as of May 06, 2024 11:33 AM. If you have any questions, ask your nurse or doctor.          B-D UF [name] MINI PEN NEEDLES 31G X 5 MM Misc Generic drug: Insulin  Pen Needle USE AS DIRECTED WITH INSULIN  PEN   cyanocobalamin  1000 MCG tablet Commonly known as: VITAMIN B12 Take 1 tablet (1,000 mcg total) by mouth daily.   empagliflozin  10 MG Tabs tablet Commonly known as: Jardiance  Take 1 tablet (10 mg total) by mouth daily before breakfast.   fenofibrate  160 MG tablet Take 1 tablet (160 mg total) by mouth daily.   fexofenadine 180 MG tablet Commonly known as: ALLEGRA Take 180 mg by mouth in the morning.   folic acid  400 MCG tablet Commonly known as: FOLVITE  TAKE 1 TABLET BY MOUTH DAILY   HYDROcodone -acetaminophen  5-325 MG tablet Commonly known as: NORCO/VICODIN Take 1-2 tablets by mouth every 6 (six) hours as needed for moderate pain (pain score 4-6).   Lantus  SoloStar 100 UNIT/ML Solostar Pen Generic drug: insulin  glargine Inject 20 Units into the skin daily. What changed:  how much to take when to take this   lisinopril  2.5 MG tablet Commonly known as: ZESTRIL  Take 1 tablet (2.5 mg total) by mouth daily.   metFORMIN  500 MG tablet Commonly known as: GLUCOPHAGE  Take 1 tablet (500 mg total) by mouth 2 (two) times daily with a meal.   metoprolol  tartrate 25 MG tablet Commonly known as: LOPRESSOR  Take 1 tablet (25 mg total) by mouth 2 (two) times daily.   nitroGLYCERIN  0.4 MG SL tablet Commonly known as: NITROSTAT  DISSOLVE 1 TABLET UNDER THE TONGUE EVERY 5 MINUTES AS  NEEDED FOR CHEST PAIN. MAX  OF 3 TABLETS IN 15 MINUTES. CALL 911 IF PAIN  PERSISTS.   OMEGA 3 PO Take 2 capsules by mouth in the morning and at bedtime.   OneTouch Delica Lancets 33G Misc Use as instructed to check blood sugar once daily or as needed.  Diagnosis:  E11.49  Non insulin  dependent.   OneTouch Verio test strip Generic drug: glucose blood TEST ONCE DAILY AND AS NEEDED   oxybutynin  5 MG tablet Commonly known as: DITROPAN  Take 1 tablet (5 mg total) by mouth every 8 (eight) hours as needed for bladder spasms.   pyridOXINE 25 MG tablet Commonly known as: VITAMIN B6 Take 1 tablet (25 mg total) by mouth daily.   rosuvastatin  40 MG tablet Commonly known as: CRESTOR  Take 1 tablet (40 mg total) by mouth daily.   tetrahydrozoline-zinc 0.05-0.25 %  ophthalmic solution Commonly known as: VISINE-AC Place 1-2 drops into both eyes 2 (two) times daily as needed (allergies/after lawnmowing).   ticagrelor  60 MG Tabs tablet Commonly known as: Brilinta  Take 1 tablet (60 mg total) by mouth 2 (two) times daily.        Allergies:  Allergies  Allergen Reactions   Metformin  And Related Other (See Comments)    Intolerant of more than 1000mg  a day (abdominal cramping)    Family History: Family History  Problem Relation Age of Onset   Ovarian cancer Mother    Bladder Cancer Father    Hypertension Father    Hyperlipidemia Father    Diabetes Father    Kidney cancer Father    Hearing loss Brother    Heart disease Paternal Grandfather    Heart attack Paternal Grandfather    Prostate cancer Neg Hx    Esophageal cancer Neg Hx    Inflammatory bowel disease Neg Hx    Liver disease Neg Hx    Pancreatic cancer Neg Hx    Stomach cancer Neg Hx    Colon cancer Neg Hx     Social History:  reports that he quit smoking about 10 years ago. His smoking use included cigarettes. He started smoking about 30 years ago. He has a 10 pack-year smoking history. He has never used smokeless tobacco. He reports current alcohol use. He reports that he does not use  drugs.   Physical Exam: BP 138/78 (BP Location: Left Arm, Patient Position: Sitting, Cuff Size: Normal)   Pulse 68   Ht 5\' 10"  (1.778 m)   Wt 186 lb (84.4 kg)   BMI 26.69 kg/m   Constitutional:  Alert and oriented, No acute distress. HEENT: [place] AT, moist mucus membranes.  Trachea midline, no masses. Neurologic: Grossly intact, no focal deficits, moving all 4 extremities. Psychiatric: Normal mood and affect.   Assessment & Plan:    1. High-grade, non-invasive urothelial carcinoma - He underwent TURBT on May 06, 2023, with findings of high-grade, non-invasive urothelial carcinoma with early papillary formation. Post-operative intravesical gemcitabine  was administered. The surgical pathology confirmed the diagnosis. The tumor was superficial, located on the mucosal layer of the bladder, and was completely resected with a wide margin due to indistinct borders. - He is experiencing hematuria with clots, which is expected post-operatively.  - Advised to increase fluid intake to prevent clot formation and facilitate healing. - Discussed treatment options: surveillance versus induction BCG therapy. Surveillance involves regular cystoscopic evaluations every three months initially, then extending the interval. BCG therapy is an option to prevent recurrence and progression, involving weekly instillations for six weeks. He is informed of potential side effects, including UTI-like symptoms. - He opted for surveillance at this time, with the understanding that BCG remains an option if recurrence occurs. - Advised to monitor for significant clot formation or inability to void, which would require immediate medical attention. - Encouraged to maintain high fluid intake and enjoy upcoming vacation, reassured about the availability of medical care if needed during travel.  Return in about 3 months (around 08/06/2024) for cystoscopy. I have reviewed the above documentation for accuracy and completeness, and I  agree with the above.   Dustin Gimenez, MD    [facility] [address] [place], [place] [address] [phone]

## 2024-05-06 NOTE — Patient Instructions (Signed)

## 2024-05-17 ENCOUNTER — Telehealth: Payer: Self-pay | Admitting: Urology

## 2024-05-17 ENCOUNTER — Other Ambulatory Visit: Payer: 59

## 2024-05-17 NOTE — Telephone Encounter (Signed)
 Patient called wanting to know if he can have a call back regarding the BCG's that were mentioned at last visit on 05/06/24. Patient doesn't not think it would be wise to wait 3 months to start these treatments considering family history. Patient would like more clarification on a clinical basis as far the amount of treatments and how long it would take. Please advise patient.

## 2024-05-17 NOTE — Telephone Encounter (Signed)
 Spoke with patient and went over notes from Dr Jeni Mitten last office note, went over what BCG treatment is and what is done during the OV and possible side effects. Patient will keep his cystoscopy appointment for now and will call back if he decides to proceed with BCG treatments instead.

## 2024-05-19 ENCOUNTER — Other Ambulatory Visit: Payer: Self-pay | Admitting: Family Medicine

## 2024-05-19 DIAGNOSIS — E1149 Type 2 diabetes mellitus with other diabetic neurological complication: Secondary | ICD-10-CM

## 2024-05-24 ENCOUNTER — Encounter: Payer: Self-pay | Admitting: Family Medicine

## 2024-05-24 ENCOUNTER — Ambulatory Visit (INDEPENDENT_AMBULATORY_CARE_PROVIDER_SITE_OTHER): Payer: 59 | Admitting: Family Medicine

## 2024-05-24 ENCOUNTER — Other Ambulatory Visit (INDEPENDENT_AMBULATORY_CARE_PROVIDER_SITE_OTHER)

## 2024-05-24 VITALS — BP 128/66 | HR 67 | Temp 98.6°F | Ht 70.0 in | Wt 190.0 lb

## 2024-05-24 DIAGNOSIS — E1149 Type 2 diabetes mellitus with other diabetic neurological complication: Secondary | ICD-10-CM | POA: Diagnosis not present

## 2024-05-24 DIAGNOSIS — Z7189 Other specified counseling: Secondary | ICD-10-CM

## 2024-05-24 DIAGNOSIS — E782 Mixed hyperlipidemia: Secondary | ICD-10-CM

## 2024-05-24 DIAGNOSIS — Z0000 Encounter for general adult medical examination without abnormal findings: Secondary | ICD-10-CM

## 2024-05-24 DIAGNOSIS — I10 Essential (primary) hypertension: Secondary | ICD-10-CM

## 2024-05-24 DIAGNOSIS — C679 Malignant neoplasm of bladder, unspecified: Secondary | ICD-10-CM | POA: Diagnosis not present

## 2024-05-24 MED ORDER — OMEGA 3 1000 MG PO CAPS: 2000.0000 mg | ORAL_CAPSULE | Freq: Two times a day (BID) | ORAL | 3 refills | Status: AC

## 2024-05-24 MED ORDER — LANTUS SOLOSTAR 100 UNIT/ML ~~LOC~~ SOPN: 15.0000 [IU] | PEN_INJECTOR | Freq: Every day | SUBCUTANEOUS | 1 refills | Status: DC

## 2024-05-24 MED ORDER — FOLIC ACID 400 MCG PO TABS: 400.0000 ug | ORAL_TABLET | Freq: Every day | ORAL | 3 refills | Status: DC

## 2024-05-24 NOTE — Patient Instructions (Addendum)
 Please call GI about scheduling.  [facility] [address] [place] [address] [place], [place] [address] [phone]  We'll update you about your labs.  Plan on recheck in about 4 months.  A1c at the visit.  Take care.  Glad to see you.

## 2024-05-24 NOTE — Progress Notes (Signed)
 CPE- See plan.  Routine anticipatory guidance given to patient.  See health maintenance.  The possibility exists that previously documented standard health maintenance information may have been brought forward from a previous encounter into this note.  If needed, that same information has been updated to reflect the current situation based on today's encounter.    covid vaccine done x2, d/w pt.   tdap 2015 PNA 2014 Flu prev done Shingles d/w pt.   PSA wnl 2025 Colonoscopy 2021. Info given to patient to call for follow up.  Diet and exercise d/w pt.   Living will d/w pt. Wife designated if patient were incapacitated.   Bladder cancer d/w pt. discussed with patient about previous workup and findings.  He has plan for observation for now with consideration of BCG later.  No blood in urine.  Prev path report d/w pt.  Prev hematuria resolved.   Diabetes:  Using medications without difficulties: yes Hypoglycemic episodes: no Hyperglycemic episodes:no Feet problems:  at baseline.  Blood Sugars averaging: usually ~130 eye exam within last year: yes Labs pending.   Hypertension:    Using medication without problems or lightheadedness: rarely lightheaded, he had been drinking some gatorade to compensate.   Chest pain with exertion:no Edema:no Short of breath:no  Elevated Cholesterol: Using medications without problems: yes Muscle aches: no Diet compliance: yes Exercise: yes Labs pending.   PMH and SH reviewed  Meds, vitals, and allergies reviewed.   ROS: Per HPI.  Unless specifically indicated otherwise in HPI, the patient denies:  General: fever. Eyes: acute vision changes ENT: sore throat Cardiovascular: chest pain Respiratory: SOB GI: vomiting GU: dysuria Musculoskeletal: acute back pain Derm: acute rash Neuro: acute motor dysfunction Psych: worsening mood Endocrine: polydipsia Heme: bleeding Allergy: hayfever  GEN: nad, alert and oriented HEENT: ncat NECK: supple  w/o LA CV: rrr. PULM: ctab, no inc wob ABD: soft, +bs EXT: no edema SKIN: no acute rash

## 2024-05-24 NOTE — Assessment & Plan Note (Signed)
 Labs pending.  Continue Jardiance  insulin  metformin .  Continue work on diet and exercise.

## 2024-05-24 NOTE — Assessment & Plan Note (Signed)
 See notes on labs.  Continue Brilinta  fenofibrate  Crestor  metoprolol  lisinopril .

## 2024-05-24 NOTE — Assessment & Plan Note (Signed)
 covid vaccine done x2, d/w pt.   tdap 2015 PNA 2014 Flu prev done Shingles d/w pt.   PSA wnl 2025 Colonoscopy 2021. Info given to patient to call for follow up.  Diet and exercise d/w pt.   Living will d/w pt. Wife designated if patient were incapacitated.

## 2024-05-24 NOTE — Assessment & Plan Note (Signed)
 Living will d/w pt.  Wife designated if patient were incapacitated.   ?

## 2024-05-24 NOTE — Assessment & Plan Note (Signed)
 He has plan for observation for now with consideration of BCG later.  He is going to follow-up with urology.

## 2024-05-24 NOTE — Assessment & Plan Note (Signed)
 See notes on labs.  Continue fenofibrate  Crestor .  Continue work on diet and exercise.

## 2024-05-25 LAB — CBC WITH DIFFERENTIAL/PLATELET
Basophils Absolute: 0 10*3/uL (ref 0.0–0.1)
Basophils Relative: 0.8 % (ref 0.0–3.0)
Eosinophils Absolute: 0.2 10*3/uL (ref 0.0–0.7)
Eosinophils Relative: 3.9 % (ref 0.0–5.0)
HCT: 36.3 % — ABNORMAL LOW (ref 39.0–52.0)
Hemoglobin: 12.5 g/dL — ABNORMAL LOW (ref 13.0–17.0)
Lymphocytes Relative: 40.2 % (ref 12.0–46.0)
Lymphs Abs: 2.1 10*3/uL (ref 0.7–4.0)
MCHC: 34.4 g/dL (ref 30.0–36.0)
MCV: 87 fl (ref 78.0–100.0)
Monocytes Absolute: 0.6 10*3/uL (ref 0.1–1.0)
Monocytes Relative: 10.7 % (ref 3.0–12.0)
Neutro Abs: 2.3 10*3/uL (ref 1.4–7.7)
Neutrophils Relative %: 44.4 % (ref 43.0–77.0)
Platelets: 277 10*3/uL (ref 150.0–400.0)
RBC: 4.17 Mil/uL — ABNORMAL LOW (ref 4.22–5.81)
RDW: 13.6 % (ref 11.5–15.5)
WBC: 5.3 10*3/uL (ref 4.0–10.5)

## 2024-05-25 LAB — COMPREHENSIVE METABOLIC PANEL WITH GFR
ALT: 17 U/L (ref 0–53)
AST: 16 U/L (ref 0–37)
Albumin: 4.4 g/dL (ref 3.5–5.2)
Alkaline Phosphatase: 45 U/L (ref 39–117)
BUN: 17 mg/dL (ref 6–23)
CO2: 27 meq/L (ref 19–32)
Calcium: 9.3 mg/dL (ref 8.4–10.5)
Chloride: 101 meq/L (ref 96–112)
Creatinine, Ser: 0.95 mg/dL (ref 0.40–1.50)
GFR: 84.57 mL/min (ref >60.00)
Glucose, Bld: 184 mg/dL — ABNORMAL HIGH (ref 70–99)
Potassium: 4.2 meq/L (ref 3.5–5.1)
Sodium: 135 meq/L (ref 135–145)
Total Bilirubin: 0.5 mg/dL (ref 0.2–1.2)
Total Protein: 6.7 g/dL (ref 6.0–8.3)

## 2024-05-25 LAB — MICROALBUMIN / CREATININE URINE RATIO
Creatinine,U: 50.7 mg/dL
Microalb Creat Ratio: 40.1 mg/g — ABNORMAL HIGH (ref 0.0–30.0)
Microalb, Ur: 2 mg/dL — ABNORMAL HIGH (ref 0.0–1.9)

## 2024-05-25 LAB — TSH: TSH: 0.87 u[IU]/mL (ref 0.35–5.50)

## 2024-05-25 LAB — VITAMIN B12: Vitamin B-12: 604 pg/mL (ref 211–911)

## 2024-05-25 LAB — LIPID PANEL
Cholesterol: 164 mg/dL (ref 0–200)
HDL: 49.6 mg/dL (ref >39.00)
LDL Cholesterol: 86 mg/dL (ref 0–99)
NonHDL: 114.43
Total CHOL/HDL Ratio: 3
Triglycerides: 142 mg/dL (ref 0.0–149.0)
VLDL: 28.4 mg/dL (ref 0.0–40.0)

## 2024-05-25 LAB — HEMOGLOBIN A1C: Hgb A1c MFr Bld: 7.9 % — ABNORMAL HIGH (ref 4.6–6.5)

## 2024-05-26 ENCOUNTER — Other Ambulatory Visit: Payer: Self-pay

## 2024-05-26 MED ORDER — EMPAGLIFLOZIN 10 MG PO TABS: 10.0000 mg | ORAL_TABLET | Freq: Every day | ORAL | 3 refills | Status: DC

## 2024-05-27 ENCOUNTER — Ambulatory Visit: Payer: Self-pay | Admitting: Family Medicine

## 2024-05-28 ENCOUNTER — Other Ambulatory Visit: Payer: Self-pay

## 2024-05-28 MED ORDER — EMPAGLIFLOZIN 10 MG PO TABS: 10.0000 mg | ORAL_TABLET | Freq: Every day | ORAL | 3 refills | Status: AC

## 2024-05-28 MED ORDER — LISINOPRIL 2.5 MG PO TABS: 2.5000 mg | ORAL_TABLET | Freq: Every day | ORAL | 3 refills | Status: AC

## 2024-05-28 MED ORDER — TICAGRELOR 60 MG PO TABS: 60.0000 mg | ORAL_TABLET | Freq: Two times a day (BID) | ORAL | 3 refills | Status: AC

## 2024-05-28 MED ORDER — FENOFIBRATE 160 MG PO TABS: 160.0000 mg | ORAL_TABLET | Freq: Every day | ORAL | 3 refills | Status: AC

## 2024-07-09 ENCOUNTER — Ambulatory Visit: Admitting: Urology

## 2024-07-09 ENCOUNTER — Other Ambulatory Visit: Payer: Self-pay

## 2024-07-09 ENCOUNTER — Telehealth: Payer: Self-pay

## 2024-07-09 VITALS — BP 121/71 | HR 68 | Wt 190.0 lb

## 2024-07-09 DIAGNOSIS — R310 Gross hematuria: Secondary | ICD-10-CM | POA: Diagnosis not present

## 2024-07-09 DIAGNOSIS — R300 Dysuria: Secondary | ICD-10-CM

## 2024-07-09 LAB — MICROSCOPIC EXAMINATION: RBC, Urine: >30 /HPF — AB (ref 0–2)

## 2024-07-09 LAB — URINALYSIS, COMPLETE
Bilirubin, UA: NEGATIVE
Ketones, UA: NEGATIVE
Leukocytes,UA: NEGATIVE
Nitrite, UA: NEGATIVE
Protein,UA: NEGATIVE
Specific Gravity, UA: 1.015 (ref 1.005–1.030)
Urobilinogen, Ur: 1 mg/dL (ref 0.2–1.0)
pH, UA: 6 (ref 5.0–7.5)

## 2024-07-09 MED ORDER — SULFAMETHOXAZOLE-TRIMETHOPRIM 800-160 MG PO TABS: 1.0000 | ORAL_TABLET | Freq: Two times a day (BID) | ORAL | 0 refills | Status: AC

## 2024-07-09 NOTE — Telephone Encounter (Signed)
 Patient complaining of blood in urine and buring with urination. I was able to schedule him an appointment with Dr. Twylla today and let him know we need a urine sample when he gets here today.

## 2024-07-09 NOTE — Progress Notes (Signed)
 07/09/2024 8:41 PM   [name] [name] Mar 12, 1961 [id number]  Referring provider: Cleatus Arlyss RAMAN, MD [address] [place],  [place] [address]  Chief Complaint  Patient presents with   Dysuria   Hematuria    HPI: [name] [name] is a 63 y.o. male previously followed by Dr. Penne for history of urothelial carcinoma.  Scheduled for surveillance cystoscopy September 2025 This morning had onset of dysuria and gross hematuria No fever, chills   PMH: Past Medical History:  Diagnosis Date   Allergic rhinitis    Aortic atherosclerosis (HCC)    Arthritis    Bilateral carotid artery disease (HCC)    BPH (benign prostatic hyperplasia)    CAD (coronary artery disease) 12/03/2013   a.) Inferior STEMI --> LHC/PCI 12/03/2013: 100% thrombotic occlusion mRCA (3 x 18 mm Xience Alpine DES); b.) LHC 07/16/2018: 20 o-pLAD, 10% mRCA  - med mgmt   Complication of anesthesia    a.) delayed/prolonged emergence   Diastolic dysfunction    Fracture of right hand 1983   Hepatic steatosis    Hyperlipidemia    Hypertension    Lesion of urinary bladder    Long term current use of ticagrelor  therapy    Nocturia    Paresthesia of both feet    Renal calculi    ST elevation myocardial infarction (STEMI) of inferior wall (HCC) 12/03/2013   a.) LCH/PCI 12/03/2013: 100% mRCA (3 x 18 mm Xience Alpine DES)   T2DM (type 2 diabetes mellitus) (HCC)     Surgical History: Past Surgical History:  Procedure Laterality Date   BLADDER INSTILLATION N/A 04/29/2024   Procedure: INSTILLATION, BLADDER;  Surgeon: Penne Knee, MD;  Location: [facility] ORS;  Service: Urology;  Laterality: N/A;  GEMCITABINE    COLONOSCOPY     x 2   KNEE SURGERY     B arthroscopic   LEFT HEART CATH AND CORONARY ANGIOGRAPHY N/A 07/16/2018   Procedure: LEFT HEART CATH AND CORONARY ANGIOGRAPHY;  Surgeon: Claudene Victory ORN, MD;  Location: [facility];  Widely patent stent in dominant RCA.   Tortuous/widely patent wraparound LAD.  Normal/tortuous LCx.  EF 55%.  Inferior HK.   LEFT HEART CATHETERIZATION WITH CORONARY ANGIOGRAM N/A 12/03/2013   Procedure: LEFT HEART CATHETERIZATION WITH CORONARY ANGIOGRAM;  Surgeon: Alm ORN Clay, MD;  Location: [facility] CATH LAB;; Large- Dom RCA - m100% (DES PCI) -<RPCA & RPAV-PLx3; Large-Long LM-< LAD, RI x 2 & LCx - mild Prox LAD, otw normal LCA System.   NM MYOVIEW  LTD  06/2018   a) 11/2015: Normal; b) 7/20198: EF 51%.  Findings consistent with prior MI with peri-infarct ischemia (LOW RISK).  Small defect of mild severity in the basal inferior mid inferior location.  There is also horizontal ST segment depression in inferior leads.  Although low risk images, the EKG changes are concerning.  There is concern for possible multivessel CAD.   PERCUTANEOUS CORONARY STENT INTERVENTION (PCI-S)  12/03/2013   mRCA - Xience Alpine DES 3.0 mm x 18 mm --> 3.5 mm   SEPTOPLASTY  1970   due to Fx Nose   TRANSTHORACIC ECHOCARDIOGRAM  11/2014   a) 12/2013 (post Inf STEMI). Mild Conc LVH; EF 45-50%, Mild Inferior HK. Grade 1 DD;; b) 11/2014. EF 55-60%. No RWMA. Moderate RV dilation but normal function.   TRANSURETHRAL RESECTION OF BLADDER TUMOR N/A 04/29/2024   Procedure: TURBT (TRANSURETHRAL RESECTION OF BLADDER TUMOR);  Surgeon: Penne Knee, MD;  Location: [facility] ORS;  Service: Urology;  Laterality: N/A;    Home Medications:  Allergies as of 07/09/2024       Reactions   Metformin  And Related Other (See Comments)   Intolerant of more than 1000mg  a day (abdominal cramping)        Medication List        Accurate as of July 09, 2024  8:41 PM. If you have any questions, ask your nurse or doctor.          B-D UF [name] MINI PEN NEEDLES 31G X 5 MM Misc Generic drug: Insulin  Pen Needle USE AS DIRECTED WITH INSULIN  PEN   Pip Pen Needles 31G x 31G X 5 MM Misc Generic drug: Insulin  Pen Needle See admin instructions. use with insulin  pen   cyanocobalamin   1000 MCG tablet Commonly known as: VITAMIN B12 Take 1 tablet (1,000 mcg total) by mouth daily.   empagliflozin  10 MG Tabs tablet Commonly known as: Jardiance  Take 1 tablet (10 mg total) by mouth daily before breakfast.   fenofibrate  160 MG tablet Take 1 tablet (160 mg total) by mouth daily.   fexofenadine 180 MG tablet Commonly known as: ALLEGRA Take 180 mg by mouth in the morning.   folic acid  400 MCG tablet Commonly known as: FOLVITE  Take 1 tablet (400 mcg total) by mouth daily.   Lantus  SoloStar 100 UNIT/ML Solostar Pen Generic drug: insulin  glargine Inject 15-20 Units into the skin at bedtime.   lisinopril  2.5 MG tablet Commonly known as: ZESTRIL  Take 1 tablet (2.5 mg total) by mouth daily.   metFORMIN  500 MG tablet Commonly known as: GLUCOPHAGE  Take 1 tablet (500 mg total) by mouth 2 (two) times daily with a meal.   metoprolol  tartrate 25 MG tablet Commonly known as: LOPRESSOR  Take 1 tablet (25 mg total) by mouth 2 (two) times daily.   nitroGLYCERIN  0.4 MG SL tablet Commonly known as: NITROSTAT  DISSOLVE 1 TABLET UNDER THE TONGUE EVERY 5 MINUTES AS  NEEDED FOR CHEST PAIN. MAX  OF 3 TABLETS IN 15 MINUTES. CALL 911 IF PAIN PERSISTS.   Omega 3 1000 MG Caps Take 2 capsules (2,000 mg total) by mouth in the morning and at bedtime.   omega-3 acid ethyl esters 1 g capsule Commonly known as: LOVAZA  Take 2 capsules by mouth 2 (two) times daily.   OneTouch Delica Lancets 33G Misc Use as instructed to check blood sugar once daily or as needed.  Diagnosis:  E11.49  Non insulin  dependent.   OneTouch Verio test strip Generic drug: glucose blood TEST ONCE DAILY AND AS NEEDED   pyridOXINE 25 MG tablet Commonly known as: VITAMIN B6 Take 1 tablet (25 mg total) by mouth daily.   rosuvastatin  40 MG tablet Commonly known as: CRESTOR  Take 1 tablet (40 mg total) by mouth daily.   sulfamethoxazole -trimethoprim  800-160 MG tablet Commonly known as: BACTRIM  DS Take 1 tablet by  mouth 2 (two) times daily for 7 days. Started by: Glendia JAYSON Barba   tetrahydrozoline-zinc 0.05-0.25 % ophthalmic solution Commonly known as: VISINE-AC Place 1-2 drops into both eyes 2 (two) times daily as needed (allergies/after lawnmowing).   ticagrelor  60 MG Tabs tablet Commonly known as: Brilinta  Take 1 tablet (60 mg total) by mouth 2 (two) times daily.        Allergies:  Allergies  Allergen Reactions   Metformin  And Related Other (See Comments)    Intolerant of more than 1000mg  a day (abdominal cramping)    Family History: Family History  Problem Relation Age  of Onset   Ovarian cancer Mother    Bladder Cancer Father    Hypertension Father    Hyperlipidemia Father    Diabetes Father    Kidney cancer Father    Hearing loss Brother    Heart disease Paternal Grandfather    Heart attack Paternal Grandfather    Prostate cancer Neg Hx    Esophageal cancer Neg Hx    Inflammatory bowel disease Neg Hx    Liver disease Neg Hx    Pancreatic cancer Neg Hx    Stomach cancer Neg Hx    Colon cancer Neg Hx     Social History:  reports that he quit smoking about 10 years ago. His smoking use included cigarettes. He started smoking about 30 years ago. He has a 10 pack-year smoking history. He has never used smokeless tobacco. He reports current alcohol use. He reports that he does not use drugs.   Physical Exam: BP 121/71 (BP Location: Left Arm, Patient Position: Sitting, Cuff Size: Large)   Pulse 68   Wt 190 lb (86.2 kg)   SpO2 97%   BMI 27.26 kg/m   Constitutional:  Alert and oriented, No acute distress. HEENT: [place] AT Respiratory: Normal respiratory effort, no increased work of breathing. Psychiatric: Normal mood and affect.  Laboratory Data:   Urinalysis Dipstick 3+ glucose/3+ blood Microscopy >30 RBC    Assessment & Plan:    1. Gross hematuria (Primary) UA with microhematuria Associated with dysuria Urine culture ordered Rx Septra  DS 1 twice daily x 7  days If urine culture negative will move up cystoscopy appointment   Glendia JAYSON Barba, MD  [facility] [address], [address] [place], [place] [address] [phone]

## 2024-07-12 ENCOUNTER — Encounter: Payer: Self-pay | Admitting: Urology

## 2024-07-12 ENCOUNTER — Ambulatory Visit: Payer: Self-pay | Admitting: Urology

## 2024-07-12 LAB — CULTURE, URINE COMPREHENSIVE

## 2024-07-22 ENCOUNTER — Other Ambulatory Visit: Payer: Self-pay | Admitting: Family Medicine

## 2024-08-04 ENCOUNTER — Telehealth: Payer: Self-pay | Admitting: Urology

## 2024-08-04 LAB — HM DIABETES EYE EXAM

## 2024-08-04 NOTE — Telephone Encounter (Signed)
 Patient dropped in office this afternoon, and requested a prescription for Valium  to take prior to his Cysto on Friday 08/06/24. He said he was given this the last time he had procedure.  Pharmacy is [facility] in [place]. Please advise patient.

## 2024-08-06 ENCOUNTER — Ambulatory Visit (INDEPENDENT_AMBULATORY_CARE_PROVIDER_SITE_OTHER): Admitting: Urology

## 2024-08-06 ENCOUNTER — Encounter: Payer: Self-pay | Admitting: Urology

## 2024-08-06 VITALS — BP 133/79 | HR 88 | Ht 70.0 in | Wt 185.0 lb

## 2024-08-06 DIAGNOSIS — C689 Malignant neoplasm of urinary organ, unspecified: Secondary | ICD-10-CM

## 2024-08-06 DIAGNOSIS — R310 Gross hematuria: Secondary | ICD-10-CM | POA: Diagnosis not present

## 2024-08-06 LAB — URINALYSIS, COMPLETE
Bilirubin, UA: NEGATIVE
Ketones, UA: NEGATIVE
Leukocytes,UA: NEGATIVE
Nitrite, UA: NEGATIVE
Protein,UA: NEGATIVE
RBC, UA: NEGATIVE
Specific Gravity, UA: 1.015 (ref 1.005–1.030)
Urobilinogen, Ur: 1 mg/dL (ref 0.2–1.0)
pH, UA: 6.5 (ref 5.0–7.5)

## 2024-08-06 LAB — MICROSCOPIC EXAMINATION

## 2024-08-06 NOTE — Progress Notes (Signed)
   08/06/24  CC:  Chief Complaint  Patient presents with   Cysto   Urologic history: Evaluation Dr. Penne April 2025 for gross hematuria.  CT urogram bilateral nonobstructing renal calculi; BPH.  Cystoscopy abnormal erythematous area left posterior wall  TURBT 04/29/2024 with post resection gemcitabine ; pathology noninvasive high-grade urothelial carcinoma with early papillary formation After discussing options with Dr. Penne he elected surveillance and lieu of induction BCG   HPI: Seen 07/09/2024 for dysuria and gross hematuria.  UA showed >30 RBC.  He was getting ready to leave for the beach and was started empirically on Septra  DS.  Subsequent urine culture was negative.  His dysuria resolved and he denies recurrent gross hematuria  Blood pressure 133/79, pulse 88, height 5' 10 (1.778 m), weight 185 lb (83.9 kg). NED. A&Ox3.   No respiratory distress   Abd soft, NT, ND Normal phallus with bilateral descended testicles  Cystoscopy Procedure Note  Patient identification was confirmed, informed consent was obtained, and patient was prepped using Betadine solution.  Lidocaine  jelly was administered per urethral meatus.     Pre-Procedure: - Inspection reveals a normal caliber urethral meatus.  Procedure: The flexible cystoscope was introduced without difficulty - No urethral strictures/lesions are present. - Prominent lateral lobe enlargement prostate  - Normal bladder neck - Bilateral ureteral orifices identified - Bladder mucosa  reveals no solid or papillary tumors.  Scar      left posterior with mild erythema - No bladder stones - No trabeculation  Retroflexion shows no median lobe or mucosal abnormalities   Post-Procedure: - Patient tolerated the procedure well  Assessment/ Plan: No solid or papillary tumor Mild mucosal erythema at prior TUR site Urine cytology was sent and if abnormal will schedule biopsy/fulguration He will be notified with results.  If  cytology is negative we discussed options of follow-up surveillance cystoscopy 3 months, cystoscopy/bladder biopsy    [name] JAYSON Barba, MD

## 2024-08-17 LAB — CYTOLOGY PLUS MONITORING PROFILE: PAP & FEULGEN

## 2024-08-18 ENCOUNTER — Ambulatory Visit: Payer: Self-pay | Admitting: Urology

## 2024-09-06 ENCOUNTER — Other Ambulatory Visit: Payer: Self-pay | Admitting: Family Medicine

## 2024-09-06 DIAGNOSIS — E1149 Type 2 diabetes mellitus with other diabetic neurological complication: Secondary | ICD-10-CM

## 2024-09-17 ENCOUNTER — Telehealth: Payer: Self-pay

## 2024-09-17 DIAGNOSIS — E1149 Type 2 diabetes mellitus with other diabetic neurological complication: Secondary | ICD-10-CM

## 2024-09-17 NOTE — Telephone Encounter (Signed)
 Copied from CRM [id number]. Topic: Clinical - Request for Lab/Test Order >> Sep 17, 2024  8:57 AM Charlet HERO wrote: Reason for CRM: Patient is requesting to have lab order put in for a week before his 3 month check up scheduled for 10/30. Please call the patient at this number [phone] ([phone] to let know when they are in place for him to go to lab.

## 2024-09-17 NOTE — Telephone Encounter (Signed)
 Is there any labs that you want him to have done?

## 2024-09-19 NOTE — Telephone Encounter (Signed)
 I put in the orders for CBC and A1c.  Thanks.

## 2024-09-19 NOTE — Addendum Note (Signed)
 Addended by: CLEATUS ARLYSS RAMAN on: 09/19/2024 10:12 PM   Modules accepted: Orders

## 2024-09-20 ENCOUNTER — Encounter: Payer: Self-pay | Admitting: Urology

## 2024-09-20 NOTE — Telephone Encounter (Signed)
 Spoke with pt notifying him [name] placed lab orders. Scheduled lab visit on 09/22/24 at 8:15.

## 2024-09-22 ENCOUNTER — Other Ambulatory Visit (INDEPENDENT_AMBULATORY_CARE_PROVIDER_SITE_OTHER)

## 2024-09-22 ENCOUNTER — Ambulatory Visit: Payer: Self-pay | Admitting: Family Medicine

## 2024-09-22 DIAGNOSIS — E1149 Type 2 diabetes mellitus with other diabetic neurological complication: Secondary | ICD-10-CM

## 2024-09-22 LAB — CBC WITH DIFFERENTIAL/PLATELET
Basophils Absolute: 0 K/uL (ref 0.0–0.1)
Basophils Relative: 0.8 % (ref 0.0–3.0)
Eosinophils Absolute: 0.3 K/uL (ref 0.0–0.7)
Eosinophils Relative: 7.5 % — ABNORMAL HIGH (ref 0.0–5.0)
HCT: 42.4 % (ref 39.0–52.0)
Hemoglobin: 14.4 g/dL (ref 13.0–17.0)
Lymphocytes Relative: 43.1 % (ref 12.0–46.0)
Lymphs Abs: 2 K/uL (ref 0.7–4.0)
MCHC: 34.1 g/dL (ref 30.0–36.0)
MCV: 85.7 fl (ref 78.0–100.0)
Monocytes Absolute: 0.5 K/uL (ref 0.1–1.0)
Monocytes Relative: 10 % (ref 3.0–12.0)
Neutro Abs: 1.8 K/uL (ref 1.4–7.7)
Neutrophils Relative %: 38.6 % — ABNORMAL LOW (ref 43.0–77.0)
Platelets: 209 K/uL (ref 150.0–400.0)
RBC: 4.94 Mil/uL (ref 4.22–5.81)
RDW: 13.6 % (ref 11.5–15.5)
WBC: 4.6 K/uL (ref 4.0–10.5)

## 2024-09-22 LAB — HEMOGLOBIN A1C: Hgb A1c MFr Bld: 7.8 % — ABNORMAL HIGH (ref 4.6–6.5)

## 2024-09-30 ENCOUNTER — Ambulatory Visit (INDEPENDENT_AMBULATORY_CARE_PROVIDER_SITE_OTHER): Admitting: Family Medicine

## 2024-09-30 ENCOUNTER — Encounter: Payer: Self-pay | Admitting: Family Medicine

## 2024-09-30 VITALS — BP 122/64 | HR 71 | Temp 98.7°F | Ht 70.0 in

## 2024-09-30 DIAGNOSIS — Z23 Encounter for immunization: Secondary | ICD-10-CM

## 2024-09-30 DIAGNOSIS — C679 Malignant neoplasm of bladder, unspecified: Secondary | ICD-10-CM | POA: Diagnosis not present

## 2024-09-30 DIAGNOSIS — Z7984 Long term (current) use of oral hypoglycemic drugs: Secondary | ICD-10-CM | POA: Diagnosis not present

## 2024-09-30 DIAGNOSIS — E1149 Type 2 diabetes mellitus with other diabetic neurological complication: Secondary | ICD-10-CM

## 2024-09-30 NOTE — Patient Instructions (Addendum)
 Please let me know if you don't get a call about seeing [facility].  [phone].  Ask about BCG.   Take care.  Glad to see you.  Check your sugar in pairs, before and after meals.  Please recheck in about 4 months with A1c at the visit but we can make plans in the meantime.

## 2024-09-30 NOTE — Progress Notes (Unsigned)
 HGB is back to normal.   He had urology eval prev.  He has been trying to get guidance on having BCG.  He was asking about 2nd opinion.    Diabetes:  Using medications without difficulties:yes Hypoglycemic episodes: he had one episode last week with possible low sugar but he didn't have meter to check at the time. He limits prolonged fasting.   Hyperglycemic episodes: no Feet problems: at baseline, stable.   Blood Sugars averaging: usually 120-140 in the AMs, occ higher.   eye exam within last year: Taking 15 units lantus .  Still on metformin  and jardiance .  He generally feels better with eating and generally worse about 1 hour after meals.    D/w pt about checking 5-6 pairs of sugars, pre/post meal and updating me.    Meds, vitals, and allergies reviewed.   ROS: Per HPI unless specifically indicated in ROS section   GEN: nad, alert and oriented HEENT: mucous membranes moist NECK: supple w/o LA CV: rrr. PULM: ctab, no inc wob ABD: soft, +bs EXT: no edema SKIN: well perfused.   Diabetic foot exam: Normal inspection No skin breakdown Small callus w/o ulceration on L 1st toe.  Normal DP pulses Normal sensation to light touch and monofilament Nails normal

## 2024-10-01 NOTE — Assessment & Plan Note (Signed)
 H/o.  He has been trying to get guidance on having BCG.  He was asking about 2nd opinion.  Referral placed.

## 2024-10-01 NOTE — Assessment & Plan Note (Signed)
 No change in meds yet.  Taking 15 units lantus .  Still on metformin  and jardiance .  He generally feels better with eating and generally worse about 1 hour after meals.    D/w pt about checking 5-6 pairs of sugars, pre/post meal and updating me.    Recheck periodically but we may make changes in meds after getting extra data from patient.

## 2024-11-05 ENCOUNTER — Encounter: Payer: Self-pay | Admitting: Urology

## 2024-11-05 ENCOUNTER — Ambulatory Visit: Admitting: Urology

## 2024-11-05 VITALS — BP 129/77 | HR 65 | Ht 70.0 in | Wt 185.0 lb

## 2024-11-05 DIAGNOSIS — C689 Malignant neoplasm of urinary organ, unspecified: Secondary | ICD-10-CM

## 2024-11-05 DIAGNOSIS — R310 Gross hematuria: Secondary | ICD-10-CM | POA: Diagnosis not present

## 2024-11-05 NOTE — Progress Notes (Unsigned)
   11/05/24  CC:  Chief Complaint  Patient presents with   Cysto   Urologic history: Evaluation Dr. Penne April 2025 for gross hematuria.  CT urogram bilateral nonobstructing renal calculi; BPH.  Cystoscopy abnormal erythematous area left posterior wall  TURBT 04/29/2024 with post resection gemcitabine ; pathology noninvasive high-grade urothelial carcinoma with early papillary formation After discussing options with Dr. Penne he elected surveillance and lieu of induction BCG   HPI: No problems since last visit.  Denies gross hematuria.  UA today clear.  Urine cytology 08/06/2024 was negative  Blood pressure 133/79, pulse 88, height 5' 10 (1.778 m), weight 185 lb (83.9 kg).   Cystoscopy Procedure Note  Patient identification was confirmed, informed consent was obtained, and patient was prepped using Betadine solution.  Lidocaine  jelly was administered per urethral meatus.     Pre-Procedure: - Inspection reveals a normal caliber urethral meatus.  Procedure: The flexible cystoscope was introduced without difficulty - No urethral strictures/lesions are present. - Prominent lateral lobe enlargement prostate  - Normal bladder neck - Bilateral ureteral orifices identified - Bladder mucosa reveals no solid or papillary tumors.  No mucosal abnormalities noted - No bladder stones - No trabeculation  Retroflexion shows no median lobe or mucosal abnormalities   Post-Procedure: - Patient tolerated the procedure well  Assessment/ Plan: No evidence of recurrent tumor Surveillance cystoscopy 3 months with urine cytology Trial Valium  5 mg p.o. 30 minutes prior to procedure neck cystoscopy if desired.  He was informed if utilizing this medication he will need a driver     [name] JAYSON Barba, MD

## 2024-11-08 LAB — URINALYSIS, COMPLETE
Bilirubin, UA: NEGATIVE
Ketones, UA: NEGATIVE
Leukocytes,UA: NEGATIVE
Nitrite, UA: NEGATIVE
Protein,UA: NEGATIVE
RBC, UA: NEGATIVE
Specific Gravity, UA: 1.015 (ref 1.005–1.030)
Urobilinogen, Ur: 2 mg/dL — ABNORMAL HIGH (ref 0.2–1.0)
pH, UA: 6.5 (ref 5.0–7.5)

## 2024-11-08 LAB — MICROSCOPIC EXAMINATION: Bacteria, UA: NONE SEEN

## 2024-12-01 ENCOUNTER — Other Ambulatory Visit: Payer: Self-pay | Admitting: Cardiology

## 2024-12-01 ENCOUNTER — Other Ambulatory Visit: Payer: Self-pay | Admitting: Family Medicine

## 2024-12-07 ENCOUNTER — Ambulatory Visit: Payer: Self-pay

## 2024-12-07 NOTE — Telephone Encounter (Signed)
 FYI Only or Action Required?: FYI only for provider: appointment scheduled on 12/08/24.  Patient was last seen in primary care on 09/30/2024 by Cleatus Arlyss RAMAN, MD.  Called Nurse Triage reporting Cyst.  Symptoms began a week ago.  Interventions attempted: Other: Warm compress, self draining.  Symptoms are: gradually worsening.  Triage Disposition: See Physician Within 24 Hours  Patient/caregiver understands and will follow disposition?: Yes Reason for Disposition  Boil > 2 inches across (> 5 cm; larger than a golf ball or ping pong ball)  Answer Assessment - Initial Assessment Questions 1. APPEARANCE of BOIL: What does the boil look like?      Middle of it is red, around it is dark purple  2. LOCATION: Where is the boil located?      Left back shoulder  3. NUMBER: How many boils are there?      1  4. SIZE: How big is the boil? (e.g., inches, cm; compare to size of a coin or other object)     Golf ball  5. ONSET: When did the boil start?     Week, patient reports having it for years, PCP has drained it before  6. PAIN: Is there any pain? If Yes, ask: How bad is the pain?   (Scale 1-10; or mild, moderate, severe)     10/10 when touching it  7. FEVER: Do you have a fever? If Yes, ask: What is it, how was it measured, and when did it start?      Denies  8. SOURCE: Have you been around anyone with boils or other Staph infections? Have you ever had boils before?     Yes has had this before  9. OTHER SYMPTOMS: Do you have any other symptoms? (e.g., shaking chills, weakness, rash elsewhere on body)     Mild nausea 2 days ago, but has subsided  Protocols used: Boil (Skin Abscess)-A-AH  Copied from CRM [id number]. Topic: Clinical - Red Word Triage >> Dec 07, 2024 10:42 AM Carlyon D wrote: Red Word that prompted transfer to Nurse Triage: Cyst on the Left back shoulder states its the size of a golf ball and very infected and very painful.

## 2024-12-07 NOTE — Telephone Encounter (Signed)
 Keep very clean with soap and water  Use warm compresses often as well  If suddenly worse head to ER or UC   Otherwise will see tomorrow  Will cc to pcp

## 2024-12-07 NOTE — Telephone Encounter (Signed)
 Noted. Thanks.

## 2024-12-07 NOTE — Telephone Encounter (Signed)
 Next Appt With Family Medicine Dessie Balls, MD) 12/08/2024 at 10:30 AM

## 2024-12-08 ENCOUNTER — Encounter: Payer: Self-pay | Admitting: Family Medicine

## 2024-12-08 ENCOUNTER — Ambulatory Visit (INDEPENDENT_AMBULATORY_CARE_PROVIDER_SITE_OTHER): Payer: Self-pay | Admitting: Family Medicine

## 2024-12-08 VITALS — BP 122/68 | HR 63 | Temp 98.1°F | Ht 70.0 in | Wt 191.4 lb

## 2024-12-08 DIAGNOSIS — L089 Local infection of the skin and subcutaneous tissue, unspecified: Secondary | ICD-10-CM

## 2024-12-08 DIAGNOSIS — L723 Sebaceous cyst: Secondary | ICD-10-CM

## 2024-12-08 MED ORDER — CEPHALEXIN 500 MG PO CAPS: 500.0000 mg | ORAL_CAPSULE | Freq: Four times a day (QID) | ORAL | 0 refills | Status: DC

## 2024-12-08 NOTE — Telephone Encounter (Signed)
 I was out of the office and message was not relayed however Dr. Randeen is seeing pt today for this

## 2024-12-08 NOTE — Assessment & Plan Note (Signed)
 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

## 2024-12-08 NOTE — Progress Notes (Signed)
 "  Subjective:    Patient ID: [name] [name] [name] [name], male    DOB: [birth year], 64 y.o.   MRN: [id number]  HPI  Wt Readings from Last 3 Encounters:  12/08/24 191 lb 6 oz (86.8 kg)  11/05/24 185 lb (83.9 kg)  08/06/24 185 lb (83.9 kg)   27.46 kg/m  Vitals:   12/08/24 1029  BP: 122/68  Pulse: 63  Temp: 98.1 F (36.7 C)  SpO2: 96%    64 yo pt of Dr [name] [name] presents with cyst on his left shoulder   Started Thursday  Stable seb cyst (infected before) - has never had it removed  Is prone to them   Painful  Draining on its own some  Wife helping care for it  Did some warm compress several nights ago   PMH notable for DM2 Bladder cancer  Screened neg for mrsa in 2015   Patient Active Problem List   Diagnosis Date Noted   Bladder cancer (HCC) 05/24/2024   Gross hematuria 01/15/2024   Pain of foot 06/18/2023   Hypertriglyceridemia 12/30/2022   Olecranon bursitis 02/27/2022   Elbow pain, left 02/15/2022   Chest wall pain 02/15/2022   Antiplatelet or antithrombotic long-term use 08/27/2020   History of colon polyps 08/27/2020   Left knee pain 11/07/2019   NSVT (nonsustained ventricular tachycardia) (HCC) - on TM Myoview  07/03/2018   Advance care planning 09/28/2014   Essential hypertension 01/27/2014   Coronary artery disease involving native heart without angina pectoris 12/03/2013    Class: Acute   Presence of drug coated stent in right coronary artery: Promus Premier DES 3.0 mm x 18 mm (3.5 mm) to mid RCA occlusion 12/03/12 12/03/2013    Class: Diagnosis of   ST elevation myocardial infarction (STEMI) of inferior wall, subsequent episode of care (HCC) 12/03/2013   Routine general medical examination at a health care facility 05/21/2012   HYPERLIPIDEMIA, MIXED -- significant hypertriglyceridemia 06/28/2008   GERD 06/28/2008   Infected sebaceous cyst of skin 06/17/2008   Diabetes mellitus type 2 with neurological manifestations (HCC) 03/17/2005   Past Medical History:   Diagnosis Date   Allergic rhinitis    Aortic atherosclerosis    Arthritis    Bilateral carotid artery disease    BPH (benign prostatic hyperplasia)    CAD (coronary artery disease) 12/03/2013   a.) Inferior STEMI --> LHC/PCI 12/03/2013: 100% thrombotic occlusion mRCA (3 x 18 mm Xience Alpine DES); b.) LHC 07/16/2018: 20 o-pLAD, 10% mRCA  - med mgmt   Complication of anesthesia    a.) delayed/prolonged emergence   Diastolic dysfunction    Fracture of right hand 1983   Hepatic steatosis    Hyperlipidemia    Hypertension    Lesion of urinary bladder    Long term current use of ticagrelor  therapy    Nocturia    Paresthesia of both feet    Renal calculi    ST elevation myocardial infarction (STEMI) of inferior wall (HCC) 12/03/2013   a.) LCH/PCI 12/03/2013: 100% mRCA (3 x 18 mm Xience Alpine DES)   T2DM (type 2 diabetes mellitus) (HCC)    Past Surgical History:  Procedure Laterality Date   BLADDER INSTILLATION N/A 04/29/2024   Procedure: INSTILLATION, BLADDER;  Surgeon: Penne Knee, MD;  Location: [facility] ORS;  Service: Urology;  Laterality: N/A;  GEMCITABINE    COLONOSCOPY     x 2   KNEE SURGERY     B arthroscopic   LEFT HEART CATH AND CORONARY ANGIOGRAPHY N/A 07/16/2018  Procedure: LEFT HEART CATH AND CORONARY ANGIOGRAPHY;  Surgeon: Claudene Victory ORN, MD;  Location: [facility] INVASIVE CV LAB;  Widely patent stent in dominant RCA.  Tortuous/widely patent wraparound LAD.  Normal/tortuous LCx.  EF 55%.  Inferior HK.   LEFT HEART CATHETERIZATION WITH CORONARY ANGIOGRAM N/A 12/03/2013   Procedure: LEFT HEART CATHETERIZATION WITH CORONARY ANGIOGRAM;  Surgeon: Alm ORN Clay, MD;  Location: [facility] CATH LAB;; Large- Dom RCA - m100% (DES PCI) -<RPCA & RPAV-PLx3; Large-Long LM-< LAD, RI x 2 & LCx - mild Prox LAD, otw normal LCA System.   NM MYOVIEW  LTD  06/2018   a) 11/2015: Normal; b) 7/20198: EF 51%.  Findings consistent with prior MI with peri-infarct ischemia (LOW RISK).  Small defect of mild  severity in the basal inferior mid inferior location.  There is also horizontal ST segment depression in inferior leads.  Although low risk images, the EKG changes are concerning.  There is concern for possible multivessel CAD.   PERCUTANEOUS CORONARY STENT INTERVENTION (PCI-S)  12/03/2013   mRCA - Xience Alpine DES 3.0 mm x 18 mm --> 3.5 mm   SEPTOPLASTY  1970   due to Fx Nose   TRANSTHORACIC ECHOCARDIOGRAM  11/2014   a) 12/2013 (post Inf STEMI). Mild Conc LVH; EF 45-50%, Mild Inferior HK. Grade 1 DD;; b) 11/2014. EF 55-60%. No RWMA. Moderate RV dilation but normal function.   TRANSURETHRAL RESECTION OF BLADDER TUMOR N/A 04/29/2024   Procedure: TURBT (TRANSURETHRAL RESECTION OF BLADDER TUMOR);  Surgeon: Penne Knee, MD;  Location: [facility] ORS;  Service: Urology;  Laterality: N/A;   Social History[1] Family History  Problem Relation Age of Onset   Ovarian cancer Mother    Bladder Cancer Father    Hypertension Father    Hyperlipidemia Father    Diabetes Father    Kidney cancer Father    Hearing loss Brother    Heart disease Paternal Grandfather    Heart attack Paternal Grandfather    Prostate cancer Neg Hx    Esophageal cancer Neg Hx    Inflammatory bowel disease Neg Hx    Liver disease Neg Hx    Pancreatic cancer Neg Hx    Stomach cancer Neg Hx    Colon cancer Neg Hx    Allergies[2] Medications Ordered Prior to Encounter[3]  Review of Systems  Constitutional:  Negative for fatigue and fever.  Skin:        Abscess -draining on left shoulder        Objective:   Physical Exam Constitutional:      General: He is not in acute distress.    Appearance: Normal appearance. He is normal weight. He is not ill-appearing or diaphoretic.  Cardiovascular:     Rate and Rhythm: Normal rate and regular rhythm.  Pulmonary:     Effort: Pulmonary effort is normal. No respiratory distress.  Skin:    General: Skin is warm and dry.     Findings: Erythema present.     Comments: 3 by 3 cm  infected seb cyst/abscess  noted on posterior left shoulder area  Moderately tender Erythematous/ no streaking  Multiple openings - with pus easily expressed today Culture swab obtained and sent  Cleaned and loosely dressed     Neurological:     Mental Status: He is alert.     Sensory: No sensory deficit.  Psychiatric:        Mood and Affect: Mood normal.           Assessment & Plan:  Problem List Items Addressed This Visit       Musculoskeletal and Integument   Infected sebaceous cyst of skin - Primary   Recurrently infected seb cyst on left posterior shoulder  Has been I and D in past This is draining from several openings / pus expressed and wound culture sent  Prescription keflex  500 mg qid  Encouraged soap /water clean/ loose dressing and warm compresses to encourage drainage  Urgent ref to dermatology in light of recurrent infections will need removal of cyst   Call back and Er precautions noted in detail today   Follow up with pcp for re check as well      Relevant Medications   cephALEXin  (KEFLEX ) 500 MG capsule   Other Relevant Orders   WOUND CULTURE      [1]  Social History Tobacco Use   Smoking status: Former    Current packs/day: 0.00    Average packs/day: 0.5 packs/day for 20.0 years (10.0 ttl pk-yrs)    Types: Cigarettes    Start date: 12/03/1993    Quit date: 12/03/2013    Years since quitting: 11.0   Smokeless tobacco: Never  Vaping Use   Vaping status: Some Days   Substances: Nicotine, Flavoring  Substance Use Topics   Alcohol use: Yes    Comment: occ on weekends   Drug use: No  [2]  Allergies Allergen Reactions   Metformin  And Related Other (See Comments)    Intolerant of more than 1000mg  a day (abdominal cramping)  [3]  Current Outpatient Medications on File Prior to Visit  Medication Sig Dispense Refill   empagliflozin  (JARDIANCE ) 10 MG TABS tablet Take 1 tablet (10 mg total) by mouth daily before breakfast. 90 tablet 3    fenofibrate  160 MG tablet Take 1 tablet (160 mg total) by mouth daily. 90 tablet 3   fexofenadine (ALLEGRA) 180 MG tablet Take 180 mg by mouth in the morning.     folic acid  (FOLVITE ) 400 MCG tablet Take 1 tablet (400 mcg total) by mouth daily. 90 tablet 3   GLOBAL EASE INJECT PEN NEEDLES 31G X 5 MM MISC USE AS DIRECTED WITH INSULIN  PEN 100 each 3   insulin  glargine (LANTUS  SOLOSTAR) 100 UNIT/ML Solostar Pen Inject 15-20 Units into the skin at bedtime. 30 mL 1   lisinopril  (ZESTRIL ) 2.5 MG tablet Take 1 tablet (2.5 mg total) by mouth daily. 90 tablet 3   metFORMIN  (GLUCOPHAGE ) 500 MG tablet TAKE 1 TABLET BY MOUTH TWICE  DAILY WITH A MEAL 180 tablet 0   metoprolol  tartrate (LOPRESSOR ) 25 MG tablet TAKE 1 TABLET BY MOUTH TWICE  DAILY 180 tablet 0   nitroGLYCERIN  (NITROSTAT ) 0.4 MG SL tablet DISSOLVE 1 TABLET UNDER THE TONGUE EVERY 5 MINUTES AS  NEEDED FOR CHEST PAIN. MAX  OF 3 TABLETS IN 15 MINUTES. CALL 911 IF PAIN PERSISTS. 100 tablet 3   Omega 3 1000 MG CAPS Take 2 capsules (2,000 mg total) by mouth in the morning and at bedtime. 360 capsule 3   ONETOUCH DELICA LANCETS 33G MISC Use as instructed to check blood sugar once daily or as needed.  Diagnosis:  E11.49  Non insulin  dependent. 100 each 3   ONETOUCH VERIO test strip TEST ONCE DAILY AND AS NEEDED 50 strip 11   PIP PEN NEEDLES 31G X 31G X 5 MM MISC See admin instructions. use with insulin  pen     rosuvastatin  (CRESTOR ) 40 MG tablet TAKE 1 TABLET BY MOUTH DAILY 90 tablet 0  tetrahydrozoline-zinc (VISINE-AC) 0.05-0.25 % ophthalmic solution Place 1-2 drops into both eyes 2 (two) times daily as needed (allergies/after lawnmowing).     ticagrelor  (BRILINTA ) 60 MG TABS tablet Take 1 tablet (60 mg total) by mouth 2 (two) times daily. 180 tablet 3   vitamin B-12 (CYANOCOBALAMIN ) 1000 MCG tablet Take 1 tablet (1,000 mcg total) by mouth daily.     vitamin B-6 (PYRIDOXINE) 25 MG tablet Take 1 tablet (25 mg total) by mouth daily.     No current  facility-administered medications on file prior to visit.   "

## 2024-12-08 NOTE — Patient Instructions (Addendum)
 Keep the cyst clean with soap and water  Do warm compress every chance you get   We want to encourage drainage   Take the generic keflex  500 mg four times daily  If any side effects or problems let us  know   I sent a wound culture sample-we will reach out with results   If symptoms worsen- call asap  Fever or new symptoms- call  If severe - go to the ER   I put the referral in for dermatology  Please let us  know if you don't hear from someone later this week set that up (mychart message or call or letter)   Depending on timing of that appointment -we will likely get you to follow up with [name] next week

## 2024-12-10 ENCOUNTER — Ambulatory Visit: Payer: Self-pay | Admitting: Family Medicine

## 2024-12-12 LAB — AEROBIC CULTURE
AER RESULT:: NO GROWTH
MICRO NUMBER:: 17437794
SPECIMEN QUALITY:: ADEQUATE

## 2024-12-20 ENCOUNTER — Ambulatory Visit: Payer: Self-pay | Admitting: Family Medicine

## 2025-01-07 ENCOUNTER — Encounter: Payer: Self-pay | Admitting: Family Medicine

## 2025-01-07 ENCOUNTER — Ambulatory Visit: Payer: Self-pay | Admitting: Family Medicine

## 2025-01-07 VITALS — BP 118/72 | HR 75 | Temp 98.5°F | Ht 70.0 in | Wt 189.0 lb

## 2025-01-07 DIAGNOSIS — E1149 Type 2 diabetes mellitus with other diabetic neurological complication: Secondary | ICD-10-CM

## 2025-01-07 LAB — POCT GLYCOSYLATED HEMOGLOBIN (HGB A1C): Hemoglobin A1C: 7.4 % — AB (ref 4.0–5.6)

## 2025-01-07 MED ORDER — FOLIC ACID 400 MCG PO TABS: 400.0000 ug | ORAL_TABLET | Freq: Every day | ORAL | 3 refills | Status: DC

## 2025-01-07 MED ORDER — LANTUS SOLOSTAR 100 UNIT/ML ~~LOC~~ SOPN: 6.0000 [IU] | PEN_INJECTOR | Freq: Every day | SUBCUTANEOUS | Status: AC

## 2025-01-07 MED ORDER — FOLIC ACID 400 MCG PO TABS: 400.0000 ug | ORAL_TABLET | Freq: Every day | ORAL | 3 refills | Status: AC

## 2025-01-07 NOTE — Patient Instructions (Addendum)
 I would take miralax daily for now as needed, then use senna docusate as needed.   Update me as needed.    Yearly visit in June 2026, labs ahead of time if possible.   Take care.  Glad to see you.

## 2025-01-07 NOTE — Progress Notes (Unsigned)
 He had trouble getting folic acid  refill.  Rx sent and printed for patient.    Prev L shoulder seb cyst has drained and scarred over.  Not red or painful now.    Diabetes:  Using medications without difficulties: yes Hypoglycemic episodes:no Hyperglycemic episodes:no Feet problems: paresthesias at baseline.  Blood Sugars averaging: see below.  eye exam within last year: yes A1c improved to 7.4, d/w pt.    Sugar has been low 100s and he was able to taper his insulin  to 6 units per day.   He had constipation with need for senna docusate.  That helped.  D/w pt about trying miralax.  No bloody or black stools.    Meds, vitals, and allergies reviewed.   ROS: Per HPI unless specifically indicated in ROS section   GEN: nad, alert and oriented HEENT: mucous membranes moist NECK: supple w/o LA CV: rrr PULM: ctab, no inc wob ABD: soft, +bs EXT: no edema SKIN: no acute rash

## 2025-01-31 ENCOUNTER — Ambulatory Visit: Admitting: Family Medicine

## 2025-02-04 ENCOUNTER — Other Ambulatory Visit: Admitting: Urology

## 2025-05-02 ENCOUNTER — Other Ambulatory Visit

## 2025-05-09 ENCOUNTER — Encounter: Admitting: Family Medicine
